# Patient Record
Sex: Male | Born: 1935 | Race: White | Hispanic: No | Smoking: Former smoker
Health system: Southern US, Community
[De-identification: ages and names within clinical notes are randomized; demographics above are authoritative.]

## PROBLEM LIST (undated history)

## (undated) DIAGNOSIS — I2510 Atherosclerotic heart disease of native coronary artery without angina pectoris: Secondary | ICD-10-CM

## (undated) DIAGNOSIS — F329 Major depressive disorder, single episode, unspecified: Secondary | ICD-10-CM

## (undated) DIAGNOSIS — I10 Essential (primary) hypertension: Secondary | ICD-10-CM

## (undated) DIAGNOSIS — M069 Rheumatoid arthritis, unspecified: Secondary | ICD-10-CM

## (undated) DIAGNOSIS — E785 Hyperlipidemia, unspecified: Secondary | ICD-10-CM

## (undated) DIAGNOSIS — F32A Depression, unspecified: Secondary | ICD-10-CM

## (undated) DIAGNOSIS — E039 Hypothyroidism, unspecified: Secondary | ICD-10-CM

## (undated) DIAGNOSIS — Z8719 Personal history of other diseases of the digestive system: Secondary | ICD-10-CM

## (undated) DIAGNOSIS — K219 Gastro-esophageal reflux disease without esophagitis: Secondary | ICD-10-CM

## (undated) DIAGNOSIS — I4891 Unspecified atrial fibrillation: Secondary | ICD-10-CM

## (undated) HISTORY — PX: TOE AMPUTATION: SHX809

## (undated) HISTORY — PX: HERNIA REPAIR: SHX51

## (undated) HISTORY — PX: JOINT REPLACEMENT: SHX530

## (undated) HISTORY — PX: OTHER SURGICAL HISTORY: SHX169

## (undated) HISTORY — PX: CHOLECYSTECTOMY: SHX55

---

## 2003-12-18 ENCOUNTER — Other Ambulatory Visit: Payer: Self-pay

## 2006-07-14 ENCOUNTER — Other Ambulatory Visit: Payer: Self-pay

## 2006-07-21 ENCOUNTER — Inpatient Hospital Stay: Payer: Self-pay | Admitting: Specialist

## 2007-12-20 ENCOUNTER — Encounter: Payer: Self-pay | Admitting: Specialist

## 2007-12-21 ENCOUNTER — Encounter: Payer: Self-pay | Admitting: Specialist

## 2008-01-21 ENCOUNTER — Encounter: Payer: Self-pay | Admitting: Specialist

## 2008-02-21 ENCOUNTER — Encounter: Payer: Self-pay | Admitting: Specialist

## 2008-03-22 ENCOUNTER — Encounter: Payer: Self-pay | Admitting: Specialist

## 2008-04-22 ENCOUNTER — Encounter: Payer: Self-pay | Admitting: Specialist

## 2011-01-15 ENCOUNTER — Inpatient Hospital Stay: Payer: Self-pay | Admitting: Internal Medicine

## 2011-01-17 ENCOUNTER — Inpatient Hospital Stay: Payer: Self-pay | Admitting: Psychiatry

## 2012-01-29 ENCOUNTER — Ambulatory Visit: Payer: Self-pay | Admitting: Internal Medicine

## 2012-02-11 ENCOUNTER — Emergency Department: Payer: Self-pay | Admitting: Emergency Medicine

## 2012-02-11 LAB — CK TOTAL AND CKMB (NOT AT ARMC)
CK, Total: 63 U/L (ref 35–232)
CK-MB: <0.5 ng/mL — ABNORMAL LOW (ref 0.5–3.6)

## 2012-02-11 LAB — COMPREHENSIVE METABOLIC PANEL
Anion Gap: 4 — ABNORMAL LOW (ref 7–16)
Calcium, Total: 8.7 mg/dL (ref 8.5–10.1)
Chloride: 104 mmol/L (ref 98–107)
Co2: 31 mmol/L (ref 21–32)
EGFR (African American): 47 — ABNORMAL LOW
Potassium: 4.1 mmol/L (ref 3.5–5.1)
SGOT(AST): 30 U/L (ref 15–37)
SGPT (ALT): 15 U/L (ref 12–78)
Total Protein: 7.7 g/dL (ref 6.4–8.2)

## 2012-02-11 LAB — TSH: Thyroid Stimulating Horm: 0.405 u[IU]/mL — ABNORMAL LOW

## 2012-02-11 LAB — URINALYSIS, COMPLETE
Bilirubin,UR: NEGATIVE
Leukocyte Esterase: NEGATIVE
Ph: 6 (ref 4.5–8.0)
RBC,UR: 1 /HPF (ref 0–5)
Squamous Epithelial: NONE SEEN
WBC UR: 3 /HPF (ref 0–5)

## 2012-02-11 LAB — CBC
HGB: 11.1 g/dL — ABNORMAL LOW (ref 13.0–18.0)
MCHC: 31.5 g/dL — ABNORMAL LOW (ref 32.0–36.0)
MCV: 84 fL (ref 80–100)
RBC: 4.2 10*6/uL — ABNORMAL LOW (ref 4.40–5.90)
WBC: 5.8 10*3/uL (ref 3.8–10.6)

## 2012-07-06 ENCOUNTER — Ambulatory Visit: Payer: Self-pay | Admitting: Physician Assistant

## 2012-09-17 ENCOUNTER — Ambulatory Visit: Payer: Self-pay | Admitting: Internal Medicine

## 2012-10-21 LAB — COMPREHENSIVE METABOLIC PANEL
Albumin: 3.8 g/dL (ref 3.4–5.0)
Anion Gap: 6 — ABNORMAL LOW (ref 7–16)
Bilirubin,Total: 0.7 mg/dL (ref 0.2–1.0)
Calcium, Total: 9.1 mg/dL (ref 8.5–10.1)
Creatinine: 1.56 mg/dL — ABNORMAL HIGH (ref 0.60–1.30)
EGFR (African American): 49 — ABNORMAL LOW
EGFR (Non-African Amer.): 42 — ABNORMAL LOW
Osmolality: 278 (ref 275–301)
SGOT(AST): 32 U/L (ref 15–37)
Total Protein: 7.8 g/dL (ref 6.4–8.2)

## 2012-10-21 LAB — CBC WITH DIFFERENTIAL/PLATELET
Basophil %: 0.2 %
Eosinophil #: 0.1 10*3/uL (ref 0.0–0.7)
HCT: 42.9 % (ref 40.0–52.0)
HGB: 15 g/dL (ref 13.0–18.0)
Lymphocyte %: 9.1 %
MCHC: 34.8 g/dL (ref 32.0–36.0)
MCV: 92 fL (ref 80–100)
Monocyte #: 0.6 x10 3/mm (ref 0.2–1.0)
Monocyte %: 6.1 %
Neutrophil #: 8.6 10*3/uL — ABNORMAL HIGH (ref 1.4–6.5)
Neutrophil %: 84 %
RBC: 4.68 10*6/uL (ref 4.40–5.90)

## 2012-10-22 ENCOUNTER — Observation Stay: Payer: Self-pay | Admitting: Internal Medicine

## 2012-10-22 LAB — URINALYSIS, COMPLETE
Bilirubin,UR: NEGATIVE
Hyaline Cast: 1
Ketone: NEGATIVE
Leukocyte Esterase: NEGATIVE
Ph: 5 (ref 4.5–8.0)
RBC,UR: 2 /HPF (ref 0–5)
Specific Gravity: 1.01 (ref 1.003–1.030)
Squamous Epithelial: <1
WBC UR: 3 /HPF (ref 0–5)

## 2012-10-22 LAB — CK-MB: CK-MB: <0.5 ng/mL — ABNORMAL LOW (ref 0.5–3.6)

## 2012-10-22 LAB — TROPONIN I: Troponin-I: <0.02 ng/mL

## 2012-10-22 LAB — CK TOTAL AND CKMB (NOT AT ARMC)
CK, Total: 81 U/L (ref 35–232)
CK, Total: 77 U/L (ref 35–232)
CK-MB: 0.9 ng/mL (ref 0.5–3.6)

## 2012-10-23 LAB — BASIC METABOLIC PANEL
BUN: 14 mg/dL (ref 7–18)
Chloride: 104 mmol/L (ref 98–107)
Co2: 33 mmol/L — ABNORMAL HIGH (ref 21–32)
Creatinine: 1.2 mg/dL (ref 0.60–1.30)
EGFR (African American): >60
Osmolality: 281 (ref 275–301)
Potassium: 3.4 mmol/L — ABNORMAL LOW (ref 3.5–5.1)
Sodium: 141 mmol/L (ref 136–145)

## 2013-08-02 ENCOUNTER — Ambulatory Visit: Payer: Self-pay | Admitting: Gastroenterology

## 2013-11-06 ENCOUNTER — Inpatient Hospital Stay: Payer: Self-pay | Admitting: Internal Medicine

## 2013-11-06 LAB — CBC WITH DIFFERENTIAL/PLATELET
BASOS PCT: 0.3 %
Basophil #: 0 10*3/uL (ref 0.0–0.1)
Eosinophil #: 0 10*3/uL (ref 0.0–0.7)
Eosinophil %: 0.7 %
HCT: 42.3 % (ref 40.0–52.0)
HGB: 13.7 g/dL (ref 13.0–18.0)
Lymphocyte #: 0.7 10*3/uL — ABNORMAL LOW (ref 1.0–3.6)
Lymphocyte %: 11.2 %
MCH: 30.6 pg (ref 26.0–34.0)
MCHC: 32.3 g/dL (ref 32.0–36.0)
MCV: 95 fL (ref 80–100)
Monocyte #: 0.5 x10 3/mm (ref 0.2–1.0)
Monocyte %: 9 %
NEUTROS ABS: 4.6 10*3/uL (ref 1.4–6.5)
Neutrophil %: 78.8 %
Platelet: 115 10*3/uL — ABNORMAL LOW (ref 150–440)
RBC: 4.46 10*6/uL (ref 4.40–5.90)
RDW: 15.3 % — AB (ref 11.5–14.5)
WBC: 5.8 10*3/uL (ref 3.8–10.6)

## 2013-11-06 LAB — URINALYSIS, COMPLETE
BILIRUBIN, UR: NEGATIVE
Bacteria: NONE SEEN
Glucose,UR: NEGATIVE mg/dL (ref 0–75)
KETONE: NEGATIVE
Leukocyte Esterase: NEGATIVE
NITRITE: NEGATIVE
Ph: 6 (ref 4.5–8.0)
Protein: NEGATIVE
RBC,UR: 1 /HPF (ref 0–5)
SQUAMOUS EPITHELIAL: NONE SEEN
Specific Gravity: 1.011 (ref 1.003–1.030)
WBC UR: 1 /HPF (ref 0–5)

## 2013-11-06 LAB — COMPREHENSIVE METABOLIC PANEL
ALT: 22 U/L (ref 12–78)
Albumin: 3.2 g/dL — ABNORMAL LOW (ref 3.4–5.0)
Alkaline Phosphatase: 109 U/L
Anion Gap: 6 — ABNORMAL LOW (ref 7–16)
BUN: 12 mg/dL (ref 7–18)
Bilirubin,Total: 0.7 mg/dL (ref 0.2–1.0)
CALCIUM: 8.5 mg/dL (ref 8.5–10.1)
CREATININE: 1.37 mg/dL — AB (ref 0.60–1.30)
Chloride: 102 mmol/L (ref 98–107)
Co2: 31 mmol/L (ref 21–32)
EGFR (African American): 57 — ABNORMAL LOW
EGFR (Non-African Amer.): 49 — ABNORMAL LOW
Glucose: 124 mg/dL — ABNORMAL HIGH (ref 65–99)
Osmolality: 279 (ref 275–301)
Potassium: 3.2 mmol/L — ABNORMAL LOW (ref 3.5–5.1)
SGOT(AST): 43 U/L — ABNORMAL HIGH (ref 15–37)
Sodium: 139 mmol/L (ref 136–145)
TOTAL PROTEIN: 7.2 g/dL (ref 6.4–8.2)

## 2013-11-06 LAB — LIPASE, BLOOD: LIPASE: 859 U/L — AB (ref 73–393)

## 2013-11-06 LAB — CK TOTAL AND CKMB (NOT AT ARMC)
CK, Total: 46 U/L
CK-MB: <0.5 ng/mL — ABNORMAL LOW (ref 0.5–3.6)

## 2013-11-06 LAB — TSH: Thyroid Stimulating Horm: 2.41 u[IU]/mL

## 2013-11-06 LAB — TROPONIN I: Troponin-I: <0.02 ng/mL

## 2013-11-07 LAB — CBC WITH DIFFERENTIAL/PLATELET
BASOS PCT: 0.4 %
Basophil #: 0 10*3/uL (ref 0.0–0.1)
EOS PCT: 1.6 %
Eosinophil #: 0.1 10*3/uL (ref 0.0–0.7)
HCT: 38.1 % — ABNORMAL LOW (ref 40.0–52.0)
HGB: 12.5 g/dL — ABNORMAL LOW (ref 13.0–18.0)
LYMPHS ABS: 0.9 10*3/uL — AB (ref 1.0–3.6)
Lymphocyte %: 23.6 %
MCH: 30.7 pg (ref 26.0–34.0)
MCHC: 32.7 g/dL (ref 32.0–36.0)
MCV: 94 fL (ref 80–100)
MONO ABS: 0.5 x10 3/mm (ref 0.2–1.0)
Monocyte %: 12.6 %
NEUTROS PCT: 61.8 %
Neutrophil #: 2.3 10*3/uL (ref 1.4–6.5)
Platelet: 104 10*3/uL — ABNORMAL LOW (ref 150–440)
RBC: 4.06 10*6/uL — ABNORMAL LOW (ref 4.40–5.90)
RDW: 15.1 % — ABNORMAL HIGH (ref 11.5–14.5)
WBC: 3.7 10*3/uL — ABNORMAL LOW (ref 3.8–10.6)

## 2013-11-07 LAB — BASIC METABOLIC PANEL
Anion Gap: 3 — ABNORMAL LOW (ref 7–16)
BUN: 10 mg/dL (ref 7–18)
CHLORIDE: 104 mmol/L (ref 98–107)
CREATININE: 1.21 mg/dL (ref 0.60–1.30)
Calcium, Total: 8.4 mg/dL — ABNORMAL LOW (ref 8.5–10.1)
Co2: 33 mmol/L — ABNORMAL HIGH (ref 21–32)
EGFR (African American): >60
EGFR (Non-African Amer.): 57 — ABNORMAL LOW
Glucose: 86 mg/dL (ref 65–99)
OSMOLALITY: 278 (ref 275–301)
Potassium: 3.2 mmol/L — ABNORMAL LOW (ref 3.5–5.1)
SODIUM: 140 mmol/L (ref 136–145)

## 2013-11-07 LAB — TSH: Thyroid Stimulating Horm: 2.8 u[IU]/mL

## 2013-11-09 LAB — COMPREHENSIVE METABOLIC PANEL
ALT: 19 U/L (ref 12–78)
Albumin: 3.2 g/dL — ABNORMAL LOW (ref 3.4–5.0)
Alkaline Phosphatase: 87 U/L
Anion Gap: 5 — ABNORMAL LOW (ref 7–16)
BUN: 5 mg/dL — AB (ref 7–18)
Bilirubin,Total: 0.6 mg/dL (ref 0.2–1.0)
CHLORIDE: 106 mmol/L (ref 98–107)
Calcium, Total: 8.7 mg/dL (ref 8.5–10.1)
Co2: 29 mmol/L (ref 21–32)
Creatinine: 1.3 mg/dL (ref 0.60–1.30)
EGFR (African American): >60
EGFR (Non-African Amer.): 52 — ABNORMAL LOW
Glucose: 84 mg/dL (ref 65–99)
Osmolality: 276 (ref 275–301)
Potassium: 3.2 mmol/L — ABNORMAL LOW (ref 3.5–5.1)
SGOT(AST): 35 U/L (ref 15–37)
Sodium: 140 mmol/L (ref 136–145)
Total Protein: 6.8 g/dL (ref 6.4–8.2)

## 2014-01-01 ENCOUNTER — Ambulatory Visit: Payer: Self-pay | Admitting: Surgery

## 2014-01-09 ENCOUNTER — Ambulatory Visit: Payer: Self-pay | Admitting: Surgery

## 2014-01-11 LAB — PATHOLOGY REPORT

## 2014-01-15 ENCOUNTER — Inpatient Hospital Stay: Payer: Self-pay | Admitting: Internal Medicine

## 2014-01-15 LAB — COMPREHENSIVE METABOLIC PANEL
ALK PHOS: 91 U/L
Albumin: 3.1 g/dL — ABNORMAL LOW (ref 3.4–5.0)
Anion Gap: 7 (ref 7–16)
BUN: 14 mg/dL (ref 7–18)
Bilirubin,Total: 0.8 mg/dL (ref 0.2–1.0)
CHLORIDE: 98 mmol/L (ref 98–107)
Calcium, Total: 8.8 mg/dL (ref 8.5–10.1)
Co2: 35 mmol/L — ABNORMAL HIGH (ref 21–32)
Creatinine: 1.35 mg/dL — ABNORMAL HIGH (ref 0.60–1.30)
GFR CALC AF AMER: 58 — AB
GFR CALC NON AF AMER: 50 — AB
GLUCOSE: 121 mg/dL — AB (ref 65–99)
OSMOLALITY: 281 (ref 275–301)
POTASSIUM: 2.9 mmol/L — AB (ref 3.5–5.1)
SGOT(AST): 31 U/L (ref 15–37)
SGPT (ALT): 17 U/L
Sodium: 140 mmol/L (ref 136–145)
Total Protein: 7.9 g/dL (ref 6.4–8.2)

## 2014-01-15 LAB — PRO B NATRIURETIC PEPTIDE: B-TYPE NATIURETIC PEPTID: 2267 pg/mL — AB (ref 0–450)

## 2014-01-15 LAB — LIPASE, BLOOD: LIPASE: 164 U/L (ref 73–393)

## 2014-01-15 LAB — CBC WITH DIFFERENTIAL/PLATELET
Basophil #: 0 10*3/uL (ref 0.0–0.1)
Basophil %: 0.5 %
EOS ABS: 0.1 10*3/uL (ref 0.0–0.7)
Eosinophil %: 1.7 %
HCT: 46.7 % (ref 40.0–52.0)
HGB: 15.1 g/dL (ref 13.0–18.0)
LYMPHS ABS: 1.1 10*3/uL (ref 1.0–3.6)
Lymphocyte %: 13.3 %
MCH: 30.4 pg (ref 26.0–34.0)
MCHC: 32.4 g/dL (ref 32.0–36.0)
MCV: 94 fL (ref 80–100)
Monocyte #: 0.6 x10 3/mm (ref 0.2–1.0)
Monocyte %: 7.3 %
Neutrophil #: 6.4 10*3/uL (ref 1.4–6.5)
Neutrophil %: 77.2 %
Platelet: 220 10*3/uL (ref 150–440)
RBC: 4.98 10*6/uL (ref 4.40–5.90)
RDW: 14.7 % — ABNORMAL HIGH (ref 11.5–14.5)
WBC: 8.3 10*3/uL (ref 3.8–10.6)

## 2014-01-15 LAB — TROPONIN I: TROPONIN-I: 0.04 ng/mL

## 2014-01-16 LAB — CBC WITH DIFFERENTIAL/PLATELET
Basophil #: 0.1 10*3/uL (ref 0.0–0.1)
Basophil %: 1 %
EOS ABS: 0.3 10*3/uL (ref 0.0–0.7)
EOS PCT: 5.4 %
HCT: 39.1 % — AB (ref 40.0–52.0)
HGB: 12.6 g/dL — AB (ref 13.0–18.0)
LYMPHS ABS: 1.4 10*3/uL (ref 1.0–3.6)
Lymphocyte %: 24.2 %
MCH: 30.4 pg (ref 26.0–34.0)
MCHC: 32.3 g/dL (ref 32.0–36.0)
MCV: 94 fL (ref 80–100)
Monocyte #: 0.6 x10 3/mm (ref 0.2–1.0)
Monocyte %: 9.6 %
NEUTROS ABS: 3.5 10*3/uL (ref 1.4–6.5)
Neutrophil %: 59.8 %
PLATELETS: 163 10*3/uL (ref 150–440)
RBC: 4.15 10*6/uL — ABNORMAL LOW (ref 4.40–5.90)
RDW: 14.7 % — AB (ref 11.5–14.5)
WBC: 5.8 10*3/uL (ref 3.8–10.6)

## 2014-01-16 LAB — URINALYSIS, COMPLETE
BLOOD: NEGATIVE
Bacteria: NONE SEEN
Bilirubin,UR: NEGATIVE
Glucose,UR: NEGATIVE mg/dL (ref 0–75)
LEUKOCYTE ESTERASE: NEGATIVE
Nitrite: NEGATIVE
Ph: 7 (ref 4.5–8.0)
Protein: 30
RBC,UR: 10 /HPF (ref 0–5)
Specific Gravity: >1.06 (ref 1.003–1.030)
Squamous Epithelial: <1
WBC UR: 3 /HPF (ref 0–5)

## 2014-01-16 LAB — COMPREHENSIVE METABOLIC PANEL
AST: 18 U/L (ref 15–37)
Albumin: 2.6 g/dL — ABNORMAL LOW (ref 3.4–5.0)
Alkaline Phosphatase: 66 U/L
Anion Gap: 9 (ref 7–16)
BUN: 12 mg/dL (ref 7–18)
Bilirubin,Total: 0.7 mg/dL (ref 0.2–1.0)
Calcium, Total: 8 mg/dL — ABNORMAL LOW (ref 8.5–10.1)
Chloride: 103 mmol/L (ref 98–107)
Co2: 35 mmol/L — ABNORMAL HIGH (ref 21–32)
Creatinine: 1.09 mg/dL (ref 0.60–1.30)
EGFR (Non-African Amer.): >60
GLUCOSE: 111 mg/dL — AB (ref 65–99)
OSMOLALITY: 293 (ref 275–301)
Potassium: 2.8 mmol/L — ABNORMAL LOW (ref 3.5–5.1)
SGPT (ALT): 15 U/L
Sodium: 147 mmol/L — ABNORMAL HIGH (ref 136–145)
TOTAL PROTEIN: 6 g/dL — AB (ref 6.4–8.2)

## 2014-01-16 LAB — MAGNESIUM
MAGNESIUM: 1.3 mg/dL — AB
Magnesium: 2.6 mg/dL — ABNORMAL HIGH

## 2014-01-16 LAB — TROPONIN I: Troponin-I: 0.04 ng/mL

## 2014-01-16 LAB — POTASSIUM: Potassium: 2.8 mmol/L — ABNORMAL LOW (ref 3.5–5.1)

## 2014-01-16 LAB — LIPASE, BLOOD: Lipase: 181 U/L (ref 73–393)

## 2014-01-17 LAB — CBC WITH DIFFERENTIAL/PLATELET
BASOS ABS: 0 10*3/uL (ref 0.0–0.1)
Basophil %: 0.5 %
EOS ABS: 0.2 10*3/uL (ref 0.0–0.7)
Eosinophil %: 2.6 %
HCT: 41.9 % (ref 40.0–52.0)
HGB: 13.8 g/dL (ref 13.0–18.0)
LYMPHS PCT: 15.7 %
Lymphocyte #: 1.1 10*3/uL (ref 1.0–3.6)
MCH: 31 pg (ref 26.0–34.0)
MCHC: 32.9 g/dL (ref 32.0–36.0)
MCV: 94 fL (ref 80–100)
MONO ABS: 0.6 x10 3/mm (ref 0.2–1.0)
Monocyte %: 8.9 %
Neutrophil #: 5 10*3/uL (ref 1.4–6.5)
Neutrophil %: 72.3 %
Platelet: 176 10*3/uL (ref 150–440)
RBC: 4.45 10*6/uL (ref 4.40–5.90)
RDW: 14.6 % — AB (ref 11.5–14.5)
WBC: 6.9 10*3/uL (ref 3.8–10.6)

## 2014-01-17 LAB — POTASSIUM: Potassium: 3.4 mmol/L — ABNORMAL LOW (ref 3.5–5.1)

## 2014-01-21 ENCOUNTER — Emergency Department: Payer: Self-pay | Admitting: Emergency Medicine

## 2014-01-21 LAB — CBC WITH DIFFERENTIAL/PLATELET
BASOS PCT: 0.7 %
Basophil #: 0.1 10*3/uL (ref 0.0–0.1)
EOS ABS: 0.2 10*3/uL (ref 0.0–0.7)
Eosinophil %: 2.1 %
HCT: 45.4 % (ref 40.0–52.0)
HGB: 15.1 g/dL (ref 13.0–18.0)
LYMPHS PCT: 15.2 %
Lymphocyte #: 1.2 10*3/uL (ref 1.0–3.6)
MCH: 30.9 pg (ref 26.0–34.0)
MCHC: 33.2 g/dL (ref 32.0–36.0)
MCV: 93 fL (ref 80–100)
Monocyte #: 0.6 x10 3/mm (ref 0.2–1.0)
Monocyte %: 7.8 %
Neutrophil #: 5.8 10*3/uL (ref 1.4–6.5)
Neutrophil %: 74.2 %
Platelet: 248 10*3/uL (ref 150–440)
RBC: 4.88 10*6/uL (ref 4.40–5.90)
RDW: 14.6 % — ABNORMAL HIGH (ref 11.5–14.5)
WBC: 7.8 10*3/uL (ref 3.8–10.6)

## 2014-01-21 LAB — COMPREHENSIVE METABOLIC PANEL
ANION GAP: 11 (ref 7–16)
Albumin: 3.1 g/dL — ABNORMAL LOW (ref 3.4–5.0)
Alkaline Phosphatase: 78 U/L
BUN: 16 mg/dL (ref 7–18)
Bilirubin,Total: 0.8 mg/dL (ref 0.2–1.0)
CALCIUM: 8.9 mg/dL (ref 8.5–10.1)
CREATININE: 1.22 mg/dL (ref 0.60–1.30)
Chloride: 101 mmol/L (ref 98–107)
Co2: 33 mmol/L — ABNORMAL HIGH (ref 21–32)
EGFR (African American): >60
EGFR (Non-African Amer.): 56 — ABNORMAL LOW
Glucose: 95 mg/dL (ref 65–99)
OSMOLALITY: 290 (ref 275–301)
POTASSIUM: 3 mmol/L — AB (ref 3.5–5.1)
SGOT(AST): 28 U/L (ref 15–37)
SGPT (ALT): 16 U/L
Sodium: 145 mmol/L (ref 136–145)
TOTAL PROTEIN: 7.5 g/dL (ref 6.4–8.2)

## 2014-01-21 LAB — MAGNESIUM: Magnesium: 1.7 mg/dL — ABNORMAL LOW

## 2014-01-21 LAB — TROPONIN I: TROPONIN-I: 0.05 ng/mL

## 2014-01-21 LAB — LIPASE, BLOOD: Lipase: 277 U/L (ref 73–393)

## 2014-02-06 ENCOUNTER — Emergency Department: Payer: Self-pay | Admitting: Emergency Medicine

## 2014-02-06 LAB — CBC WITH DIFFERENTIAL/PLATELET
BASOS ABS: 0.1 10*3/uL (ref 0.0–0.1)
Basophil %: 0.7 %
EOS ABS: 0.2 10*3/uL (ref 0.0–0.7)
Eosinophil %: 1.7 %
HCT: 35 % — AB (ref 40.0–52.0)
HGB: 11.8 g/dL — ABNORMAL LOW (ref 13.0–18.0)
Lymphocyte #: 0.7 10*3/uL — ABNORMAL LOW (ref 1.0–3.6)
Lymphocyte %: 7 %
MCH: 31.6 pg (ref 26.0–34.0)
MCHC: 33.7 g/dL (ref 32.0–36.0)
MCV: 94 fL (ref 80–100)
MONOS PCT: 7.4 %
Monocyte #: 0.7 x10 3/mm (ref 0.2–1.0)
NEUTROS ABS: 7.8 10*3/uL — AB (ref 1.4–6.5)
Neutrophil %: 83.2 %
Platelet: 283 10*3/uL (ref 150–440)
RBC: 3.74 10*6/uL — AB (ref 4.40–5.90)
RDW: 15.1 % — AB (ref 11.5–14.5)
WBC: 9.4 10*3/uL (ref 3.8–10.6)

## 2014-02-06 LAB — BASIC METABOLIC PANEL
ANION GAP: 9 (ref 7–16)
BUN: 11 mg/dL (ref 7–18)
CHLORIDE: 102 mmol/L (ref 98–107)
CO2: 26 mmol/L (ref 21–32)
Calcium, Total: 8 mg/dL — ABNORMAL LOW (ref 8.5–10.1)
Creatinine: 1.24 mg/dL (ref 0.60–1.30)
EGFR (African American): >60
GFR CALC NON AF AMER: 55 — AB
Glucose: 90 mg/dL (ref 65–99)
Osmolality: 273 (ref 275–301)
Potassium: 4.1 mmol/L (ref 3.5–5.1)
Sodium: 137 mmol/L (ref 136–145)

## 2014-04-25 ENCOUNTER — Ambulatory Visit: Payer: Self-pay | Admitting: Gastroenterology

## 2014-09-09 ENCOUNTER — Ambulatory Visit: Payer: Self-pay | Admitting: Family Medicine

## 2014-09-12 ENCOUNTER — Encounter
Admit: 2014-09-12 | Disposition: A | Discharge status: Home / Self Care | Payer: Self-pay | Attending: Surgery | Admitting: Surgery

## 2014-09-14 ENCOUNTER — Ambulatory Visit: Payer: Self-pay | Admitting: Physician Assistant

## 2014-09-21 ENCOUNTER — Encounter
Admit: 2014-09-21 | Disposition: A | Discharge status: Home / Self Care | Payer: Self-pay | Attending: Surgery | Admitting: Surgery

## 2014-10-12 NOTE — Discharge Summary (Signed)
PATIENT NAME:  Matthew Foley, Matthew Foley MR#:  [id number] DATE OF BIRTH:  [birth year]  DATE OF ADMISSION:  10/22/2012 DATE OF DISCHARGE:  10/23/2012  PRESENTING COMPLAINTS: Vomiting and chest discomfort.   DISCHARGE DIAGNOSES: 1.  Intractable nausea and vomiting, secondary to large hiatal hernia along with gastroesophageal reflux disease/gastritis, resolved, improved.  2.  Chest pain, secondary to gastritis.  3.  Depression.  4.  Hypertension.   CONDITION ON DISCHARGE: Stable.   MEDICATIONS:  1.  Metoprolol, extended-release, 100 mg daily.  2.  Levothyroxine 50 mg daily.  3.  Citalopram 40 mg p.o. daily.  4.  Alprazolam 0.25 tablet b.i.d.  5.  Ensure 3 times a day with meals.  6.  Lansoprazole 30 mg, 1 capsule twice a day.   DIET: Mechanical soft diet.   Follow up with Dr. Arlana Pouch in 1 to 2 weeks.   Follow up with Dr. Bluford Kaufmann if needed.   Cardiac enzymes x 3 negative.   Basic metabolic panel within normal limits, with a potassium of 3.4.   UA negative for urinary tract infection.   Chest x-ray: Large sliding hiatal hernia with mild atelectasis.   CBC within normal limits.   GI consultation with Dr. Bluford Kaufmann.   BRIEF SUMMARY OF HOSPITAL COURSE: The patient is a pleasant 81 year old Caucasian gentleman with a history of hypertension and CAD, along with a history of GERD, comes to the Emergency Room with chest pain/chest discomfort, followed by nausea, vomiting at home. He was admitted with:   1.  Intractable nausea and vomiting, which was suspected likely due to a large sliding hiatal hernia. The patient has had EGD and colonoscopy done in November 2013 by Dr. Niel Hummer. Per patient, he was told he has a large hiatal hernia. Was started on PPI once daily, however the patient continued to have intermittent episodes of nausea and vomiting. He had 2 episodes of large emesis after he ate a sandwich on the day of admission. He was started on IV fluids, continued his PPI twice a day. Seen by Dr. Bluford Kaufmann, and  since the patient has had extensive an workup for the same no further workup was recommended at this time. The patient was also recommended to get surgery done, however he does not want to do it at this time.  2.  Chest pain: Seemed more of GI cause, possibly from GERD in the setting of a large hiatal hernia. A modified barium swallow was done in January 2014. This was essentially negative. The patient's cardiac enzymes x 3 were negative. No chest pain, and EKG changes were noted. He has had 2 stress tests in the past which were essentially negative, per patient. 3.  Hypertension: Well-controlled on Toprol-XL.  4.  Hypothyroidism: Continue Synthroid.  5. DVT prophylaxis: With Lovenox.   Hospital stay otherwise remained stable.   CODE STATUS: THE PATIENT REMAINED A FULL CODE.  Time spent: Forty minutes.   ____________________________ Wylie Hail Allena Katz, MD sap:dm D: 10/24/2012 06:59:38 ET T: 10/24/2012 07:48:24 ET JOB#: [id number]  cc: Ari Engelbrecht A. Allena Katz, MD, <Dictator> Jillene Bucks. Arlana Pouch, MD Ezzard Standing. Bluford Kaufmann, MD Willow Ora MD ELECTRONICALLY SIGNED 11/08/2012 15:20

## 2014-10-12 NOTE — Consult Note (Signed)
Chief Complaint:  Subjective/Chief Complaint Feeling much better. Tolerating liquids. NO more nausea/vomiting/chest pain.   VITAL SIGNS/ANCILLARY NOTES: **Vital Signs.:   04-May-14 08:09  Vital Signs Type Routine  Temperature Source oral  Pulse Pulse 48  Systolic BP Systolic BP 765  Diastolic BP (mmHg) Diastolic BP (mmHg) 77  Mean BP 92  Pulse Ox % Pulse Ox % 93  Oxygen Delivery Room Air/ 21 %   Brief Assessment:  Cardiac Regular   Respiratory clear BS   Gastrointestinal Normal   Lab Results: Routine Chem:  04-May-14 05:03   Glucose, Serum 85  BUN 14  Creatinine (comp) 1.20  Sodium, Serum 141  Potassium, Serum  3.4  Chloride, Serum 104  CO2, Serum  33  Calcium (Total), Serum 9.0  Anion Gap  4  Osmolality (calc) 281  eGFR (African American) >60  eGFR (Non-African American)  58 (eGFR values <55m/min/1.73 m2 may be an indication of chronic kidney disease (CKD). Calculated eGFR is useful in patients with stable renal function. The eGFR calculation will not be reliable in acutely ill patients when serum creatinine is changing rapidly. It is not useful in  patients on dialysis. The eGFR calculation may not be applicable to patients at the low and high extremes of body sizes, pregnant women, and vegetarians.)   Assessment/Plan:  Assessment/Plan:  Assessment Nausea/vomiting/chest pain. Resolved.   Plan Try solids. If stable, ok for discharge later today on PPI BID. If sxs recur, can f/u with [facility] for outpt EGD. If large hiatal hernia is the only objective finding, then will need surgical referral for hiatal hernia repair. Will sign off. Thanks.   Electronic Signatures: OVerdie Shire(MD)  (Signed [id number])  Authored: Chief Complaint, VITAL SIGNS/ANCILLARY NOTES, Brief Assessment, Lab Results, Assessment/Plan   Last Updated: 04-May-14 10:39 by OVerdie Shire(MD)

## 2014-10-12 NOTE — Consult Note (Signed)
Pt seen and examined. Full consult to follow. Pt with recurrent nausea/vomiting and chest pain. Had both EGD/colon by Dr. Niel Hummer in Nov. Found to have large hiatal hernia. Was taking PPI at home with relief of heartburn sxs. R/O for MI. Better today. Not interested in having hernia repair at this time. However, if sxs persist and no other causes found, then may need to pursue surgical evaluations. Start clear liquid diet today on PPI. If sxs do not improve, then may need to repeat EGD on Monday to see if patient has PUD or esophagitis contributing to his problems. No need for barium swallow now. Will follow. Thanks.  Electronic Signatures: Lutricia Feil (MD)  (Signed on 03-May-14 10:15)  Authored  Last Updated: 03-May-14 10:15 by Lutricia Feil (MD)

## 2014-10-12 NOTE — Consult Note (Signed)
PATIENT NAME:  Matthew Foley, Matthew Foley MR#:  [id number] DATE OF BIRTH:  [birth year]  DATE OF CONSULTATION:  10/22/2012  CONSULTING PHYSICIAN:  Ezzard Standing. Sami Roes, MD  REASON FOR REFERRAL: Chest pain, and nausea and vomiting.   DESCRIPTION: The patient is a 81 year old white male who presented with chest pain that developed after eating dinner. He had an episode of nausea and vomiting followed by mid-sternal chest pain that radiated to his neck. He felt a little short of breath. Therefore, he was brought in by EMS for further evaluation. Even after being in the Emergency Room he had several more vomiting episodes. Therefore, the patient was admitted for further evaluation.   According to the patient, the patient has intermittent nausea and vomiting for the past several months. At times food seems to get stuck in his throat. He has also lost some weight over the past year or so.   The patient has been seen by Dr. Niel Hummer in the past, who did an upper endoscopy and colonoscopy back in November. Upper endoscopy showed a large hiatal hernia. Colonoscopy was apparently negative. I do not have those records to confirm. The patient also had a modified barium swallow in January of this year which was supposedly negative. The patient was  recommended to see a surgeon for a hernia repair but the patient refused. Since being admitted he is feeling better and there is currently no nausea for right now. He is ruling out for a MI.   Other past medical history includes: History of coronary artery disease and hypothyroidism, history of hypertension, hyperlipidemia. He also has rheumatoid arthritis.   SURGICAL HISTORY: Includes bilateral knee replacements, hernia repairs. He also had a toe amputation.   He has no known drug allergies.   MEDICATIONS AT HOME: Include alprazolam twice a day, citalopram once a day, levothyroxine once a day, Toprol and Prevacid, daily.   SOCIAL HISTORY: He smokes some cigars a few times a week.  Denies any alcohol use. He lives alone.   FAMILY HISTORY: Notable for heart disease and cancer.   REVIEW OF SYSTEMS: There is some generalized weakness and some weight loss, but no changes in vision or hearing. There is no coughing or shortness of breath. He does have some chest pain, but no palpitations.   GI symptoms include some nausea, vomiting, and occasional diarrhea.   The rest of the review of symptoms is negative.   PHYSICAL EXAMINATION: GENERAL: The patient is in no acute distress. He is well-nourished and well-built. He is afebrile.  VITAL SIGNS: Are stable.  HEENT: Normocephalic, atraumatic head. Pupils are equally reactive. Throat was clear.  NECK: Supple.  CARDIAC: Shows a regular rhythm and rate, without murmurs.  LUNGS: Clear bilaterally.  ABDOMEN: Showed normoactive bowel sounds, soft. There may be a mild tenderness in the epigastric region. There is no hepatomegaly.  EXTREMITIES: No clubbing, cyanosis, or edema.  SKIN: Negative.  NEUROLOGICAL: Examination is nonfocal.   LABS: Were essentially negative. Creatinine was slightly up at 1.56. Troponin levels are normal. White count is 10.3. Hemoglobin was 15 on admission.   EKG showed some bifascicular block.   Chest x-ray was negative.   This is a patient with chest pain, followed by nausea or vomiting. He has had bouts of nausea and vomiting in the past. The chest pain appears more gastrointestinal in nature. He may have some  reflux or esophageal spasm, but it could also be related to his large hiatal hernia. At this point  there is  no need to repeat the barium swallow. If the symptoms continue I may consider repeating an upper endoscopy to assess for the large hiatal hernia, but rule out other causes such as esophagitis or gastritis.   I think that it would be important that he goes on and stays on a proton pump inhibitor twice a day. If the symptoms continue with other obvious causes then he should consider having a  surgical evaluation to have his hiatal hernia repaired. I will start the patient on a clear liquid diet today and see how he does.   Thank you for the referral.     ____________________________ Ezzard Standing. Bluford Kaufmann, MD pyo:dm D: 10/23/2012 08:29:58 ET T: 10/23/2012 08:55:43 ET JOB#: [id number]  cc: Ezzard Standing. Bluford Kaufmann, MD, <Dictator> Ezzard Standing Deacon Gadbois MD ELECTRONICALLY SIGNED 10/24/2012 13:19

## 2014-10-12 NOTE — H&P (Signed)
PATIENT NAME:  Matthew Foley, Matthew Foley MR#:  [id number] DATE OF BIRTH:  Oct 05, 1932  PRIMARY CARE PHYSICIAN: Jillene Bucks. Arlana Pouch, MD  REFERRING PHYSICIAN: Dorothea Glassman, MD   CHIEF COMPLAINT: Chest pain.   HISTORY OF PRESENT ILLNESS: The patient is a 81 year old pleasant white male who presented to the Emergency Department with complaints of chest pain. The patient ate his dinner. About an hour later, around 7:30 to 8:00 p.m., he had 1 episode of a large amount of vomiting, followed by starting to experience chest pain in the midsternal area, pressure-like pain radiating to the neck. This was associated with severe shortness of breath. Considering this, called his daughter, who called EMS and was brought to the Emergency Department. The patient had persistence of the pain until he came to the Emergency Department. The patient was given Zofran in the Emergency Department. The patient went to chest x-ray and came back, had another episode of large vomiting. The patient was placed on nitroglycerin patch before going to the x-ray. Had relief of the pain after had another episode of vomiting. The patient currently denies having any pain; however, experiences some burning sensation in the throat. The patient has been having these episodes of nausea and vomiting for the last 1 month, which have been getting more frequent. The patient states he gets these episodes of nausea and vomiting every 2 to 3 days. Per daughter, this is projectile vomiting, large amount. The patient experiences sometimes food stuck in the chest. He usually feels it 2 to 3 days well after these episodes of nausea and vomiting and starts to develop nausea and gets these multiple episodes of vomiting. The patient has lost about 35 pounds in the last 1 year. Concerning this, went to his primary care physician, who referred the patient to gastroenterology. The patient underwent EGD and colonoscopy and was found to have hiatal hernia. The patient also had swallow  evaluation, which was unremarkable. He does not remember if the patient had any barium swallow. The patient also states that has been experiencing more frequent bowel movements, 2 to 3 times a day. However, colonoscopy was done which was unremarkable. The patient has been more depressed since the patient's wife expired about a year back.   PAST MEDICAL HISTORY:  1. Hypertension.  2. Coronary artery disease, per records, had history of stent placement.  3. Hyperlipidemia.  4. Hypothyroidism.  5. Rheumatoid arthritis with ulnar deviation.  6. Bilateral knee replacement.  7. Two hernia repairs.  8. 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   ALLERGIES: No known drug allergies.   HOME MEDICATIONS:  1. Prevacid 1 capsule daily.  2. Toprol-XL 100 mg daily.  3. Levothyroxine 50 mcg daily.  4. Citalopram 1 tablet once a day.  5. Alprazolam 0.25 mg 1 tablet 2 times a day.   SOCIAL HISTORY: Smokes cigars a few times a week. Denies drinking alcohol or using illicit drugs. Currently lives by himself.   FAMILY HISTORY: Father had heart disease and cancer. Father died of lung cancer.   REVIEW OF SYSTEMS:  CONSTITUTIONAL: Generalized weakness, weight loss.  EYES: No change in vision. No discharge or redness.  ENT: Somewhat decreased hearing. No sore throat.  RESPIRATORY: No cough, shortness of breath.  CARDIOVASCULAR: Chest pain.  GASTROINTESTINAL: Nausea, vomiting, diarrhea.   GENITOURINARY: No dysuria or hematuria.  SKIN: No rash or lesions.  HEMATOLOGIC: No easy bruising or bleeding.  ENDOCRINE: No polyuria or polydipsia.  PSYCHIATRIC: Depression. NEUROLOGIC: No weakness or numbness.  MUSCULOSKELETAL:  Has multiple joint pains and aches.   PHYSICAL EXAMINATION:  GENERAL: This is a well-built, well-nourished, age-appropriate male, lying down in the bed, not in distress.  VITAL SIGNS: Temperature 97.9, pulse 67, blood pressure 137/82, respiratory rate of 16, oxygen saturation is 96% on room  air.  HEENT: Head normocephalic, atraumatic. Eyes: No scleral icterus. Conjunctivae normal. Pupils equal and reactive to light. Extraocular movements intact. Mucous membranes moist. No pharyngeal erythema.  NECK: Supple. No lymphadenopathy. No JVD. No carotid bruit.  CHEST: No focal tenderness.  LUNGS: Bilaterally clear to auscultation.  HEART: S1, S2 regular. No murmurs are heard.  ABDOMEN: Bowel sounds present. Soft. Mild tenderness in the epigastric area. No guarding or rebound tenderness. No hepatosplenomegaly.  MUSCULOSKELETAL: Has ulnar deviation. Good range of motion in all the extremities.  SKIN: No rash or lesions.  LYMPHATIC: No cervical, axillary or inguinal lymphadenopathy.  NEUROLOGIC: The patient is alert, oriented to place, person and time. Cranial nerves II through XII intact. No motor and sensory deficits.   LABORATORIES: CMP: BUN 23, creatinine of 1.56. The rest of all the values are within normal limits. Troponin less than 0.2. CBC: WBC of 10.3, hemoglobin 15, platelet count of 175.   ELECTROCARDIOGRAM, 12-LEAD: There is a new bifascicular block.   CHEST X-RAY: One view, portable: No acute cardiopulmonary disease.   ASSESSMENT AND PLAN: The patient is a 81 year old male who comes to the Emergency Department with chest pain followed by nausea and vomiting.   1. Chest pain. This seems to be more of a gastrointestinal cause, possibly from the esophageal spasm. However, considering the patient's risk factors of previous history of coronary artery disease and chest pain, will admit the patient to the telemetry bed. Cycle cardiac enzymes x3. Obtain stress test in the morning. Will keep the patient n.p.o. after midnight except for medications. Will hold the beta blocker in the morning prior to the stress test. Will also obtain lipid profile. Continue with aspirin.  2. Nausea, vomiting, very highly concerning about obstruction, either esophageal or gastric outlet. Per the patient, had  EGD and colonoscopy as well as swallow evaluation. Will obtain barium swallow to rule out any gastric outlet obstruction. Continue with the PPI.  3. Hypertension, currently well controlled.  4. Hypothyroidism. Will check the TSH. Continue Synthroid.  5. Keep the patient on deep vein thrombosis prophylaxis with Lovenox.   TIME SPENT: 50 minutes.    ____________________________ Susa Griffins, MD pv:OSi D: 10/22/2012 02:55:00 ET T: 10/22/2012 81:27:51 ET JOB#: [id number]  cc: Susa Griffins, MD, <Dictator> Jillene Bucks. Arlana Pouch, MD Clerance Lav Roald Lukacs MD ELECTRONICALLY SIGNED 10/24/2012 7:03

## 2014-10-13 NOTE — Discharge Summary (Signed)
PATIENT NAME:  Matthew Foley, Matthew Foley MR#:  [id number] DATE OF BIRTH:  Sep 10, 1932  DATE OF ADMISSION:  11/06/2013 DATE OF DISCHARGE:  11/09/2013  ADMISSION DIAGNOSES:   1.  Nausea and vomiting.  2.  Common bile duct dilation.   DISCHARGE DIAGNOSES: 1.  Nausea and vomiting with acute pancreatitis, slightly elevated lipase on admission.  2.  Common bile duct dilation.  3.  Pancreatitis.  4.  Depression.  5.  Hepatitis. 6.  History of coronary artery disease.   CONSULTATIONS:  Dr. Candace Cruise.   PROCEDURES: The patient underwent ERCP on 11/08/2013.  The entire main duct was  severely dilated, uncertain significance. The examination was suspicious for sludge. He received a stent placement.   LABORATORIES AT DISCHARGE: Sodium 140, potassium 3.2, chloride 106, bicarb 29, BUN 5, creatinine 1.30, glucose 84, calcium 8.7, bilirubin 0.6, alk phos 87, ALT 19, AST 35, total protein 6.8, albumin 3.2.   HOSPITAL COURSE:  This is a 81 year old male who presented with nausea and vomiting and found to have a dilated CBD on CT scan. For further details, please refer to Dr. Ward Givens  H and P.  1.  Nausea and vomiting secondary to acute pancreatitis with elevation in his lipase, also possibility due to the common bile duct dilation and the fact that patient was retaining his food. He is status post ERCP with stent placement. He is tolerating his diet quite well. He had periampullary diverticula that were also found on ERCP.  2.  Common bile duct dilation. He underwent ERCP as mentioned above. He also had an M.R.C.P. M.R.C.P. will be followed up by Dr. Candace Cruise.  I discussed discharge plan with Dr. Candace Cruise, and, as per him, the patient was stable to go home. Titrated to a regular diet and will have followup with Dr. Rayann Heman for M.R.C.P. results.  3.  Pancreatitis. The patient presented with elevated lipase. He was placed on n.p.o. initially, IV fluids. His symptoms actually resolved and he was tolerating his diet.  4.  Depression. The  patient to continue Paxil.  5.  Hypothyroidism, on Synthroid.  6. History of coronary artery disease. The patient is not taking any medications at home. He was discharged with aspirin daily.    DISCHARGE MEDICATIONS: 1.  Lansoprazole 30 mg b.i.d.  2.  Paxil 20 mg daily.  3.  Synthroid 75 mcg daily.  4.  Ropinirole 0.5 mg at bedtime.  5.  Xanax 0.5 mg b.i.d. p.r.n.  6.  Ensure 1 can t.i.d.  7.  Aspirin 81 mg daily.   DISCHARGE DIET: Low sodium.   DISCHARGE ACTIVITY: As tolerated, but no heavy lifting.   DISCHARGE FOLLOWUP:  The patient will follow up next week with Dr. Rayann Heman.    TIME SPENT: 35 minutes.   The patient is medically stable for discharge.   ____________________________ Jyron Turman P. Benjie Karvonen, MD spm:dmm D: 11/09/2013 11:48:40 ET T: 11/09/2013 12:11:18 ET JOB#: [id number]  cc: Duval Macleod P. Benjie Karvonen, MD, <Dictator> Arther Dames, MD Leona Carry. Hall Busing, MD Donell Beers Silvestre Mines MD ELECTRONICALLY SIGNED 11/09/2013 16:42

## 2014-10-13 NOTE — Consult Note (Signed)
PATIENT NAME:  Matthew Foley, Matthew Foley MR#:  [id number] DATE OF BIRTH:  29-Apr-1933  DATE OF CONSULTATION:  01/16/2014  REFERRING PHYSICIAN:  Dr. Renae Gloss.  CONSULTING PHYSICIAN:  Joselyn Arrow, NP and Dr. Midge Minium.  PRIMARY GASTROENTEROLOGIST: Dr. Dow Adolph.  SURGEON:  Dr. Renda Rolls.  PRIMARY CARE PHYSICIAN:  Dewaine Oats.  REASON FOR CONSULTATION:  Intractable nausea and vomiting.   HISTORY OF PRESENT ILLNESS: Matthew Foley is a 81 year old Caucasian male who has history of chronic nausea and vomiting. He was a patient of Dr. Dow Adolph. Over the last 2-1/2 years, he has had chronic problems with nausea and vomiting. In February of this year, he had a normal gastric emptying study. He has had a complicated course over the past 2 months  with dilated common bile duct, choledocholithiasis on M.R.C.P. and an ERCP attempted here at [facility] by Dr. Bluford Kaufmann.  He was later sent to at [facility] where Dr. Wyline Mood did an ERCP and sphincterotomy and removed common bile duct stone. He then did get better. He underwent laparoscopic cholecystectomy by Dr. Renda Rolls 6 days ago. Intraoperative cholangiogram demonstrated dilated bile duct, but no retained common duct stones. For the first 3 days, he felt better; however, 3 days ago, he began to have recurrent nausea and vomiting. He also complains of chest pain, which radiates from his epigastric area and radiates between his shoulder blades. He rates the pain 10 out of 10 on pain scale. He was on ranitidine 300 mg daily at home and had previously been on Protonix, but he had not resumed PPI in quite some time. He denies any dysphagia or odynophagia. He did have a normal upper GI tract on ERCP by Dr. Bluford Kaufmann. He reports a 70+ pound weight loss over the past year. He did have a negative troponin and an  EKG which showed a bundle branch bundle and PVCs. He has vomited every time he eats something for the last 3 days; is mostly undigested food.  Prior to 3 days ago, he was able to eat and had normal bowel movements. He did have a normal bowel movement yesterday as well. He has not had any rectal bleeding, melena or mucus in his stools. He was taking Norco for pain. He had a mildly elevated lipase at 859 back in May 2015, and CT of the chest, abdomen and pelvis at that time showed a dilated common bile duct and pancreatic ductal dilatation and 9 mm filling defect in the distal common bile duct near the ampulla. There was pancreatic head prominence concerning for right either inflammation versus neoplasm. He had a small sliding hiatal hernia.   Yesterday, he had a CT scan of the abdomen and pelvis with contrast which showed a moderate pneumoperitoneum, but no free extravasation of contrast, questionable small perforated viscus; however, the contrast did pass through the bowel. He also had mild peripancreatic inflammation and stranding and a 9 mm common bile duct.   PAST MEDICAL AND SURGICAL HISTORY: Hypertension, thyroid disease, dyslipidemia, coronary artery disease, rheumatoid arthritis, GERD, hiatal hernia, choledocholithiasis status post laparoscopic cholecystectomy, bilateral knee replacement, right second toe amputation, hiatal hernia repair.   MEDICATIONS PRIOR TO ADMISSION: Alprazolam 0.5 mg b.i.d. p.r.n., Centrum multivitamins daily, K-Tab 20 mEq daily for the last 5 days, levothyroxine 75 mcg daily, Mag-Ox 400 mg daily, Paxil 20 mg daily, ropinirole 0.5 mg at bedtime.   ALLERGIES: No known drug allergies.   FAMILY HISTORY: There is no known family history of  colorectal carcinoma, liver or chronic GI problems.   SOCIAL HISTORY: He smokes cigars. Denies any other tobacco intake. Denies any alcohol or illicit drug use. He has 3 daughters. 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   REVIEW OF SYSTEMS:  See HPI, otherwise negative 10 point review of systems.     PHYSICAL EXAMINATION: VITAL SIGNS: Temp 97.8, pulse 85, respirations  18, blood pressure 187/113, O2 sat 95% on room air.  GENERAL: He is a thin, Caucasian male who is alert, pleasant and cooperative, in no acute distress, is accompanied by his daughter.  HEENT: Sclerae clear. Noninjected conjunctivae, pink. Oropharynx pink and moist without any lesions.  NECK: Supple without mass or thyromegaly.  CHEST: Heart regular rate and rhythm. Normal S1, S2 without murmurs, clicks, rubs or gallops.  LUNGS: Clear to auscultation bilaterally.  ABDOMEN: Positive bowel sounds x 4. No bruits auscultated. Abdomen is soft, nontender and nondistended without palpable mass or hepatosplenomegaly. No rebound tenderness or guarding. Well-healing lap cholecystectomy scars, right upper quadrant.  EXTREMITIES: Without clubbing or edema.  SKIN: Pink, warm and dry without any rash or jaundice.  NEUROLOGIC: Grossly intact.  MUSCULOSKELETAL:  Good equal movement and strength bilaterally.  PSYCHIATRIC: Normal mood and affect.   LABORATORY STUDIES: Glucose 111, sodium 147, potassium 2.8, CO2 of 35, calcium 8, otherwise normal basic metabolic panel. Magnesium 2.6. Lipase 164. LFTs normal except for albumin of 2.6 and total protein of 6. Troponin is 0.04. Hemoglobin has dropped from 15.1 to 12.6, hematocrit 39.1. White blood cell count is normal. Platelet count is normal. Urinalysis positive for 30 mg/dL of protein and mucus.   IMPRESSION: [name] is a very pleasant 81 year old Caucasian male who is 6 days status post cholecystectomy by Dr. Renda Rolls, admitted with 3 days of nausea and vomiting every time he eats, chest and epigastric pain. CT suggests possible mild pneumoperitoneum, but maybe postsurgical changes only versus perforated viscus and possible mild pancreatitis, although lipase was normal. His chest and epigastric pain radiates to his back and is worse postprandially. He has chronic gastroesophageal reflux disease, nausea and vomiting, although this seems different to patient.  Since he is 6 days postop, we will have surgery evaluate for possible postop complications. Perforated viscus would be unlikely given passage of contrast on CT. We will recheck lipase to look for pancreatitis and check troponin to rule out cardiac origin. I have discussed his care with Dr. Midge Minium and our plan of care is below.   PLAN: 1.  Stat lipase and troponin.  2.  Zofran p.r.n. and R.N. is to contact Dr. Renae Gloss regarding pain medication.  3.  Clear liquids.  4.  Protonix 40 mg b.i.d. IV.  5.  Await surgical recommendations.  6.  CBC in the morning.   Thank you for allowing [place] to participate in his care. Dr. Servando Snare will re-evaluate the patient tomorrow.    ____________________________ Joselyn Arrow, NP klj:dmm D: 01/16/2014 21:21:55 ET T: 01/16/2014 21:57:39 ET JOB#: [id number]  cc: Joselyn Arrow, NP, <Dictator> Jillene Bucks Arlana Pouch, MD Joselyn Arrow FNP ELECTRONICALLY SIGNED 01/18/2014 14:40

## 2014-10-13 NOTE — Consult Note (Signed)
Details:   - GI Note:  Double duct sign on CT concerning for pancreatic mass.   Plan for ERCP tomorrow.   NPO after midnight.  Will not get MRCP, unlikely to change management.   Electronic Signatures: Dow Adolph (MD)  (Signed 19-May-15 13:17)  Authored: Details   Last Updated: 19-May-15 13:17 by Dow Adolph (MD)

## 2014-10-13 NOTE — Consult Note (Signed)
ERCP done for Dr. Shelle Iron. Significantly dilated CBD with filling defect in distal CBD. Periampullary diverticulum. No obvious stricture. Sludge came out. Yet, poor bile drainage. Thus, cytology brushings and then stent placed with good bile drainage. Clear liquid diet. Recheck LFT in AM. thanks.  Electronic Signatures: Lutricia Feil (MD)  (Signed on 20-May-15 13:50)  Authored  Last Updated: 20-May-15 13:50 by Lutricia Feil (MD)

## 2014-10-13 NOTE — Consult Note (Signed)
Brief Consult Note: Diagnosis: n/v, abnormal CT scan, dilated CBD, prominant pancreatic head.   Patient was seen by consultant.   Consult note dictated.   Discussed with Attending MD.   Comments: Patient was seen and examined. N/V have subsided today and he was advanced to a CL diet which he is tolerating well. No current abdominal pain/back pain. no n/v. Overall feeling well today. Labs and imaging concerning for possible pancreatic neoplasm. Will need further eval of CBD/filling defect and PD. Discussed with patient the need for ERCP. Risks and benefits were outlined and he verbalized understanding. Will likely keep NPO after MN tonight and plan on procedure tomorrow. Will communicate with Dr Bluford Kaufmann regarding scheduling. All questions were answered.  Full consult being dictated.  Electronic Signatures: Brantley Stage M (PA-C)  (Signed 19-May-15 13:17)  Authored: Brief Consult Note   Last Updated: 19-May-15 13:17 by Ashok Cordia (PA-C)

## 2014-10-13 NOTE — Op Note (Signed)
PATIENT NAME:  Matthew Foley, Matthew Foley MR#:  [id number] DATE OF BIRTH:  06/28/1932  DATE OF PROCEDURE:  01/09/2014  PREOPERATIVE DIAGNOSIS: Chronic cholecystitis.   POSTOPERATIVE DIAGNOSIS: Chronic cholecystitis.   PROCEDURE: Laparoscopic cholecystectomy, cholangiogram.   SURGEON: Renda Rolls, MD  ANESTHESIA: General.   INDICATIONS: This 81 year old male has a history of vomiting and weight loss and epigastric pain, CT findings of common duct stone. He had ERCP and sphincterotomy and was advised to have laparoscopic cholecystectomy to avoid future gallstones.   DESCRIPTION OF PROCEDURE: The patient was placed on the operating table in the supine position under general anesthesia. The abdomen was prepared with ChloraPrep and draped in a sterile manner. A short incision was made in the inferior aspect of the umbilicus and carried down to the deep fascia which was grasped with laryngeal hook and elevated. A Veress needle was inserted, aspirated and irrigated with a saline solution. Next, the peritoneal cavity was inflated with carbon dioxide. The Veress needle was removed. A 10 mm cannula was inserted. The 10 mm, 0 degree laparoscope was inserted to view the peritoneal cavity. The liver appeared slightly infiltrated with fat but not cirrhotic. Another incision was made in the epigastrium slightly to the right of the midline to introduce an 11 mm cannula. Two incisions were made in the lateral aspect of the right upper quadrant to introduce two 5 mm cannulas.   With the patient in the reverse Trendelenburg position and turned several degrees to the left, the gallbladder was retracted towards the right shoulder. The infundibulum was retracted inferiorly and laterally. The location of porta hepatis was demonstrated. The gallbladder neck was mobilized with incision of the visceral peritoneum. The cystic duct was dissected free from surrounding structures. There was a cystic artery which was dissected free from  surrounding structures. A critical view of safety was demonstrated. An Endo Clip was placed across the cystic duct adjacent to the neck of the gallbladder. An incision was made in the cystic duct. There was some arterial bleeding at this point, which appeared to be a branch of the cystic artery and was controlled with endoclips. Next, the cystic duct was further incised and the Reddick catheter was inserted. The cholangiogram was done with injection of half-strength Conray-60 dye. The cholangiogram demonstrated a dilated common bile duct and reflux into the liver and flow into the duodenum. No retained stones were seen. The Reddick catheter was removed. The cystic duct was ligated with Endoclip and also with 0 chromic Endoloop and then the first Endoclip was removed and replaced with a new Endoclip adjacent to the chromic ligature. Next, the gallbladder was dissected free from the liver with use of hook and cautery. Several other small bleeding points were controlled with Endoclips along the gallbladder bed. The gallbladder was completely separated. The site was irrigated with heparinized saline solution and aspirated. Hemostasis subsequently appeared to be intact. Next, the gallbladder was delivered up through the infraumbilical incision, opened and suctioned, removed and submitted in formalin for routine pathology. The right upper quadrant was further inspected as the cannulas were removed. There was some bleeding from the epigastric port site and the laparoscope was reinserted through the umbilical port and cannula. There was no bleeding seen within. The tissues surrounding the 11 mm port site were infiltrated with 0.5% Sensorcaine with epinephrine. Hemostasis subsequently appeared to be intact. The other sites were infiltrated with subcutaneous 0.5% Sensorcaine with epinephrine. Subsequently, the wounds were closed with interrupted 5-0 chromic subcuticular sutures, benzoin and Steri-Strips.  Dressings were  applied with paper tape. The patient tolerated the procedure satisfactorily and was then prepared for transfer to the recovery room.  ____________________________ Shela Commons. Renda Rolls, MD jws:sb D: 01/09/2014 16:23:09 ET T: 01/09/2014 16:40:02 ET JOB#: [id number]  cc: Adella Hare, MD, <Dictator> Adella Hare MD ELECTRONICALLY SIGNED 01/10/2014 9:10

## 2014-10-13 NOTE — H&P (Signed)
PATIENT NAME:  Matthew Foley, Matthew Foley MR#:  [id number] DATE OF BIRTH:  1932-07-17  DATE OF ADMISSION:  11/06/2013  PRIMARY CARE PHYSICIAN: Dr. Dewaine Oats.   REFERRING PHYSICIAN: Dr. Sharma Covert.   CHIEF COMPLAINT: Nausea, vomiting.   HISTORY OF PRESENT ILLNESS: The patient is a 81 year old male with a history of hypertension, coronary artery disease status post stent placement, with sliding hiatal hernia with intractable nausea and vomiting, comes to the Emergency Department with the complaints of nausea and vomiting for the last 2 days. The patient is unable to keep down any food. The patient states he was doing well until Saturday. Saturday afternoon ate some steak, followed by started feeling fullness in the stomach and associated with abdominal pain. Had the projectile vomiting. The patient continued to have multiple episodes of vomiting. Concerning this, the patient is brought to the Emergency Department.   WORKUP IN THE EMERGENCY DEPARTMENT: CT abdomen and pelvis was obtained, showed severe dilatation of the distal common bile duct with a filling defect present concerning for possible noncalcified stone pathology such as mass. Also noted to have dilatation of the pancreatic duct. The patient has elevated lipase of 850. Denies having any abdominal pain. The patient had extensive workup done with EGD, colonoscopy, and a barium swallow which came back to be negative. Concerning this, patient is found to be dehydrated with an elevated BUN and creatinine.   PAST MEDICAL HISTORY:  1. Hypertension.  2. Hyperlipidemia.  3. Coronary artery disease status post a stent placement.  4. Chronic nausea and vomiting.  5. Hypothyroidism.  6. Rheumatoid arthritis with ulnar deviation.  7. Bilateral knee replacement.  8. Two hernia repairs.  9. 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   ALLERGIES: No known drug allergies.   HOME MEDICATIONS:  1. Ropinirole 0.5 mg once a day.  2. Paxil 20 mg once a day.   3. Levothyroxine 75 mcg once a day.  4. Lansoprazole 30 mg 2 times a day.  5. Alprazolam  0.5 mg 2 times a day.   SOCIAL HISTORY: Continues to smoke cigars. Denies drinking alcohol or using illicit drugs. Lives by himself.   FAMILY HISTORY: Father had heart disease and cancer, died of lung cancer.   REVIEW OF SYSTEMS:  CONSTITUTIONAL: Severe generalized weakness. Significant weight loss of 50 pounds in the last 1 year.  EYES: No change in vision.  EARS, NOSE, AND THROAT:  No change in hearing.  RESPIRATORY: No cough, shortness of breath.  CARDIOVASCULAR: No chest pain.  GASTROINTESTINAL: Has nausea, vomiting,  GENITOURINARY: No dysuria or hematuria.  SKIN: No rashes or lesions.  HEMATOLOGIC: No easy bruising or bleeding.  PSYCHIATRIC: History of depression.  NEUROLOGIC: No weakness or numbness in any part of the body.  MUSCULOSKELETAL: Has multiple joint pains and aches.   PHYSICAL EXAMINATION:  GENERAL: This is a well built, well nourished, age-appropriate male lying down in the bed, not in distress.  VITAL SIGNS: Temperature 98.5, pulse 94, blood pressure 153/92, respiratory rate of 18, oxygen saturations 94% on room air.  HEENT: Head normocephalic, atraumatic. There is no scleral icterus. Conjunctivae normal. Pupils equal and react to light. Extraocular movements are intact. Mucous membranes moist. No pharyngeal erythema.  NECK: Supple. No lymphadenopathy. No JVD. No carotid bruit. No thyromegaly.  CHEST: Has no focal tenderness.  LUNGS: Bilaterally clear to auscultation.  HEART: S1, S2 regular. No murmurs are heard.  ABDOMEN: Bowel sounds present. Soft, nontender, nondistended. Could not appreciate any hepatosplenomegaly.  EXTREMITIES: No pedal  edema. Pulses 2+.  SKIN: No rash or lesions.  MUSCULOSKELETAL: Good range of motion in all the extremities. NEUROLOGICAL:  The patient is alert, oriented to place, person, and time. Cranial nerves II through XII intact. Motor 5/5 in  upper and lower extremities.   LABORATORY DATA: CBC and CMP are completely within normal limits, except for BUN 12, creatinine of 1.37. TSH 2.41, lipase 859. UA negative for nitrites and leukocyte esterase.   DIAGNOSTIC DATA: CT abdomen and pelvis as mentioned above shows significant dilatation of the common bile duct and the pancreatic duct. Recommended MRCP.   CT head without contrast: No acute intracranial abnormality, severe vessel ischemic change.   ASSESSMENT AND PLAN: This patient is a 81 year old male who comes to the Emergency Department with intractable nausea and vomiting.  1. Nausea and vomiting.    from the sliding hiatal hernia. However, considering the patient's significant dilatation of the common bile duct even though the patient has normal LFTs, we will obtain MRCP. Consult GI. Keep the patient on clear liquids. Continue with antinausea medications. Continue with IV Protonix.  2. Renal insufficiency secondary to dehydration. Continue with IV fluids and followup.  3. Sliding hiatal hernia. If other workup comes back to be negative for the cause of the nausea and the vomiting, patient will benefit being seen by general surgery during the hospital stay.  4. Weight loss secondary to decreased p.o. intake. CT head is negative.  5. Keep the patient on DVT prophylaxis with Lovenox.   TIME SPENT: 50 minutes.    ____________________________ Susa Griffins, MD pv:dd/am D: 11/07/2013 01:02:59 ET T: 11/07/2013 01:51:10 ET JOB#: [id number]  cc: Susa Griffins, MD, <Dictator> Jillene Bucks. Arlana Pouch, MD  Clerance Lav Jami Bogdanski MD ELECTRONICALLY SIGNED 11/17/2013 0:44

## 2014-10-13 NOTE — Consult Note (Signed)
PATIENT NAME:  Matthew Foley, Matthew Foley MR#:  [id number] DATE OF BIRTH:  October 12, 1932  GASTROINTESTINAL CONSULTATION   DATE OF CONSULTATION:  11/07/2013  REFERRING PHYSICIAN:  Susa Griffins, MD CONSULTING PHYSICIAN:  Hardie Shackleton. Heath Tesler, PA-C  REASON FOR CONSULTATION: Dilated common bile duct.   ATTENDING GASTROENTEROLOGIST: Dow Adolph, MD   HISTORY OF PRESENT ILLNESS: This is a pleasant 81 year old gentleman accompanied by one of his daughters, who initially presented to the hospital with concerns of intractable nausea and vomiting. He has established with Dr. Shelle Iron as an outpatient and has had intermittent episodes of nausea and vomiting for quite some time now. Since being admitted, his nausea and vomiting has actually subsided, and he was started on a clear liquid diet earlier this morning, which he is tolerating well. There is no associated abdominal pain. No fever or chills. No chest pain or shortness of breath. No changes to his bowel habits, though he did get some diarrhea following the contrast from his CAT scan. He underwent a CT of the chest, abdomen and pelvis, revealing a normal gallbladder, but common bile duct was dilated distally at 16 mm, with pancreatic ductal dilation as well. There was a 9 mm filling defect in the distal common bile duct near the ampulla. There is some pancreatic head prominence, concerning for either inflammation versus neoplasm. He also has a large sliding hiatal hernia, which he has previously been made aware of. Overall, labs are unrevealing. Liver enzymes are normal, and white blood cells are normal. However, his lipase is mildly elevated at 859. He has had a prior EGD and colonoscopy in 2013 and underwent a gastric emptying study recently that was unremarkable. The patient has lost about 75 pounds over the past 8 months, and the patient feels that some of this is because of a lack of appetite and some of it is because most of what he eats ends up coming back up. He has  been drinking Ensure to supplement his caloric intake.  PAST MEDICAL HISTORY: Hypertension, dyslipidemia, coronary artery disease, hypothyroidism, rheumatoid arthritis.   PAST SURGICAL HISTORY: Bilateral knee replacement, right second toe amputation, hernia repair.   ALLERGIES: No known drug allergies.   HOME MEDICATIONS: Lansoprazole, alprazolam, levothyroxine, ropinirole, Paxil.   SOCIAL HISTORY: Habits: The patient smokes an occasional cigar, but denies any cigarettes or other tobacco intake. No alcohol or illicit drug use. He has several daughters.   FAMILY HISTORY: There is no known family history of GI malignancy, colon polyps or IBD.   REVIEW OF SYSTEMS: A 10-system review of systems was obtained on the patient. Pertinent positives are mentioned above and otherwise negative.   OBJECTIVE:  VITAL SIGNS: Blood pressure 131/65, heart rate 68, respirations 20, temperature 97.1, bedside pulse oximetry 91%.  GENERAL: This is a pleasant 81 year old gentleman accompanied by his daughter, in no acute distress. Alert and oriented x3.  HEAD: Atraumatic, normocephalic.  NECK: Supple. No lymphadenopathy noted.  HEENT: Sclerae anicteric. Mucous membranes moist.  PULMONARY: Respirations are even and unlabored. Clear to auscultation, bilateral anterior lung fields.  CARDIAC: Regular rate and rhythm. S1, S2 noted.  ABDOMEN: Soft, nontender, nondistended. Normoactive bowel sounds noted in all 4 quadrants. No guarding or rebound. No masses, hernias or organomegaly appreciated.  PSYCHIATRIC: Appropriate mood and affect.  NEUROLOGIC: Cranial nerves II through XII are grossly intact.  EXTREMITIES: Negative for lower extremity edema, 2+ pulses noted in bilateral upper extremities.   LABORATORY DATA: White blood cells 3.7, hemoglobin 12.5, hematocrit 38.1, platelets 104, MCV 94.  Lipase 859. Troponins negative. Sodium 140, potassium 3.2, BUN 10, creatinine 1.21, glucose 86, TSH 2.8, total bilirubin 0.7,  alkaline phosphatase 109, ALT 22, AST 43.   IMAGING: A CT scan of the chest, abdomen and pelvis was obtained on the patient. Gallbladder did appear normal, but his common bile duct was markedly dilated distally at 16 mm. There was also pancreatic ductal dilation. A 9 mm filling defect was noted in the distal common bile duct near the ampulla. There is also some pancreatic head prominence concerning for either inflammation versus neoplasm. He has a large sliding hiatal hernia.   ASSESSMENT:  1. Nausea and vomiting. This has been intermittent for several months and did seem to get worse yesterday. This has subsided today, and he is tolerating a clear liquid diet.  2. Unintentional weight loss of 75 pounds over the past 8 months.  3. Abnormal CT scan showing dilated common bile duct and pancreatic duct, with a filling defect in the distal common bile duct. There is also some pancreatic head prominence concerning for inflammation versus neoplasm.   PLAN: I have discussed this patient's case in detail with Dr. Dow Adolph, who was involved in the development of the patient's plan of care. At the present time, the overall clinical picture is certainly concerning for possibly a pancreatic neoplasm, and therefore, we do feel an appropriate next step would be to further classify this with an ERCP. There is no clear indication for an MRCP at the present moment as this likely would not change our medical management currently. We did discuss this procedure with the patient in detail, including risks and benefits, and he verbalized understanding and is in agreement to proceed. Differential diagnosis was outlined in detail. We will keep him n.p.o. after midnight tonight and proceed with an ERCP with Dr. Bluford Kaufmann tomorrow. All questions were answered. We will continue to monitor this patient closely and make further recommendations pending above and per clinical course.   Thank you so much for this consultation and for  allowing [place] to participate in the patient's plan of care.   ATTENDING GASTROENTEROLOGIST: Dow Adolph, MD   ____________________________ Hardie Shackleton. Felecia Stanfill, PA-C kme:lb D: 11/07/2013 13:40:55 ET T: 11/07/2013 13:50:56 ET JOB#: [id number]  cc: Hardie Shackleton. Aamiyah Derrick, PA-C, <Dictator> Hardie Shackleton Dekota Kirlin PA ELECTRONICALLY SIGNED 11/07/2013 16:09

## 2014-10-13 NOTE — Consult Note (Signed)
Brief Consult Note: Diagnosis: Post-op nausea, vomiting, chest/epigastric pain.   Patient was seen by consultant.   Comments: Matthew Foley is a very pleasant 81 y/o caucasian male who is 6 days s/p cholecystectomy by Dr Renda Rolls admitted with 3 days of nausea, vomiting every time he eats, chest & epigastric pain.  CT suggests possible mild pneumoperitoneum but may be post-surgical changes only versus perforated viscus & possible mild pancreatitis although lipase was normal.  His chest & epigastric pain readiates to his back & is worse postprandially.  He has chronic GERD, nausea & vomiting although this seems different to patient.  Since he is 6 days post-op will have surgery evaluate for possible post-op complications.  Perforated viscus would be unlikely given passage of contrast to colon on CT.  Will recheck lipase to look for pancreatitis & check troponin to r/o cardiac origin.  I have discussed her care with Dr Ebony Hail [facility] & our plan of care is below.  Plan: 1) Stat lipase & troponin 2) continue zofran prn & RN has contacted Dr Hilton Sinclair regarding pain medication 3) Clear liquids 4) Protonix 40mg  BID IV 5) await surgical consult 6) CBC AM Thanks for allowing to participate in his care.  Dr [place] will re-evaluate pt tomorrow.  Please see full dictated note. Servando Snare.  Electronic Signatures: #[id number] (NP)  (Signed 28-Jul-15 21:24)  Authored: Brief Consult Note   Last Updated: 28-Jul-15 21:24 by 10-12-1997 (NP)

## 2014-10-13 NOTE — Discharge Summary (Signed)
PATIENT NAME:  Matthew Foley, Matthew Foley MR#:  [id number] DATE OF BIRTH:  04-28-1933  DATE OF ADMISSION:  01/15/2014 DATE OF DISCHARGE:  01/17/2014  PRIMARY CARE PHYSICIAN:  Katherina Right C. Arlana Pouch, MD.  FINAL DIAGNOSES: 1.  Hypokalemia. 2.  Hypomagnesemia.  3.  Nausea, vomiting, recent cholecystectomy.  4.  Hypothyroidism.  5.  History of coronary artery  disease.  6.  Anxiety and depression.   MEDICATIONS ON DISCHARGE: Include Paxil 20 mg daily, levothyroxine 75 mcg daily, Requip 0.5 mg at bedtime, alprazolam 0.5 mg 1 tablet twice a day as needed for anxiety and nervousness, multivitamin 1 tablet daily, magnesium oxide 400 mg daily for 5 days, K-Tabs 20 mEq daily for 5 days, omeprazole 40 mg twice a day, Ensure 240 mL 3 times a day.  Stop taking Norco.   DIET: Low fat, low cholesterol diet.  Start as full liquid diet and then proceed to soft diet with pureed meats.   ACTIVITY: As tolerated.   FOLLOWUP:  Follow up with Dr. Shelle Iron, gastroenterology, in 1-2 weeks, with Dr. Dewaine Oats.   HOSPITAL COURSE: The patient was admitted 01/15/2014, discharged 01/17/2014. Came in with nausea, vomiting. The patient had a recent ERCP with stone removal and then cholecystectomy, came in with 3 days of nausea, vomiting.  LABORATORY AND RADIOLOGICAL DATA DURING THE HOSPITAL COURSE: Included an EKG that showed right bundle branch block, left anterior fascicular block. BNP 2226. Troponin negative. Lipase 164. Glucose 121, BUN 14, creatinine 1.35, sodium 140, potassium 2.9, chloride 98, CO2 of 35, calcium 8.8. Liver function tests normal range. White blood cell count 8.3, H and H 15.1 and 46.7, platelet count of 220,000.  CT scan of the abdomen and pelvis, moderate pneumoperitoneum slightly more than would be expected, given recent surgery. Magnesium 1.3. Repeat magnesium 2.6. Repeat potassium 2.8. Repeat potassium upon discharge 3.4. Troponin negative. Repeat lipase 181. CBC within normal range upon discharge.   HOSPITAL COURSE  PER PROBLEM LIST:  1.  For the hypokalemia and hypomagnesemia, probably related to the nausea and vomiting, and poor appetite. This was replaced during the hospital course, and will have oral replacement upon going home.  2.  Nausea and vomiting with recent cholecystectomy, Dr. Renda Rolls saw in consultation. No surgical complications at this point. Abdomen was soft, nontender. No urgent surgery needed. I spoke with Dr. Servando Snare, gastroenterology.  His PA did see the patient. No indications for endoscopy at this time. He advised full liquid diet for a few days then go up to soft diet with pureed meats and able to discharge home and follow up with Dr. Alycia Rossetti as outpatient. Would prescribe PPI b.i.d. and also Ensure for more calories. 3.  Hypothyroidism on levothyroxine. 4.  History of coronary artery disease.  The patient's chest pain was believed secondary to the nausea and vomiting and acid reflux. 5.  Anxiety and depression on Paxil and Xanax.  TIME SPENT ON DISCHARGE: 35 minutes.    ____________________________ Herschell Dimes. Renae Gloss, MD rjw:ds D: 01/17/2014 15:21:49 ET T: 01/17/2014 15:46:27 ET JOB#: [id number]  cc: Herschell Dimes. Renae Gloss, MD, <Dictator> Jillene Bucks. Arlana Pouch, MD Dow Adolph, MD  Salley Scarlet MD ELECTRONICALLY SIGNED 01/18/2014 15:46

## 2014-10-13 NOTE — H&P (Signed)
PATIENT NAME:  Matthew Foley, Matthew Foley MR#:  [id number] DATE OF BIRTH:  1933-04-08  DATE OF ADMISSION:  01/15/2014  PRIMARY CARE PHYSICIAN: Dr. Dewaine Oats  REFERRING PHYSICIAN: Dr. Loreli Dollar  CHIEF COMPLAINT: Nausea, vomiting.  HISTORY OF PRESENT ILLNESS: The patient is a 81 year old male with history of chronic nausea and vomiting, who was admitted on 11/06/2013 with choledocholithiasis and acute pancreatitis. The patient underwent ERCP at [facility] and underwent elective cholecystectomy on 01/09/2014 by Dr. Renda Rolls. The patient states, after the surgery, the patient did well for about 2 to 3 days. He started to experience nausea and vomiting, multiple episodes, unable to keep down any food for the last 3 days. Denies having any diarrhea. Denies having any fever. Concerning this, the patient came to the Emergency Department. In the Emergency Department, CT of the abdomen and pelvis done, showed moderate pneumoperitoneum, slightly more than would be expected, given recent surgery. No extravasation of oral contrast. Mild peripancreatic inflammatory stranding, raising the possibility of pancreatitis. The patient's lipase is 164. Does not have any elevated white blood cell count or elevated LFTs.  PAST MEDICAL HISTORY: 1.  Hypertension. 2.  Hyperlipidemia. 3.  Coronary artery disease, status post stent placement. 4.  Chronic nausea and vomiting. 5.  Hypothyroidism. 6.  Rheumatoid arthritis with ulnar deviation. 7.  Bilateral knee replacements. 8.  Two hernia repairs. 9.  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     ALLERGIES: No known drug allergies.  HOME MEDICATIONS:  1.  Ropinirole 0.5 mg once a day. 2.  Paxil 20 mg once a day. 3.  Norco 5/325 mg 1 to 2 tablets every 4 hours as needed. 4.  Levothyroxine 75 mcg once a day. 5.  Centrum multivitamin once a day. 6.  Alprazolam 0.5 mg 2 times a day as needed.   SOCIAL HISTORY: Continues to smoke cigars. Denies drinking alcohol  or using illicit drugs. Lives by himself.  FAMILY HISTORY: Father had heart disease and cancer. Died of lung cancer.  REVIEW OF SYSTEMS:  CONSTITUTIONAL: Experiencing generalized weakness. EYES: No change in vision. ENT: No change in hearing. RESPIRATORY: No cough, shortness of breath. CARDIOVASCULAR: No chest pain, palpitations. GASTROINTESTINAL: Has nausea and vomiting. GENITOURINARY: No dysuria or hematuria. SKIN: No rash or lesions. HEMATOLOGIC: No easy bruising or bleeding. MUSCULOSKELETAL: No joint pains. Has osteoarthritis.  PHYSICAL EXAMINATION: GENERAL: This is a well-built, well-nourished, age-appropriate male, lying down in the bed, not in distress. VITAL SIGNS: Temperature 97.6, pulse 81, blood pressure 148/70, respiratory rate of 20, oxygen saturation 95% on room air. HEENT: Head normocephalic, atraumatic. There is no scleral icterus. Conjunctivae normal. Pupils equal and reactive. Extraocular movements are intact. Mucous membranes moist. No pharyngeal erythema. NECK: Supple. No lymphadenopathy. No JVD. No carotid bruit. CHEST: No focal tenderness. LUNGS: Bilateral clear to auscultation. HEART: S1 and S2. Regular. No murmurs are heard. ABDOMEN: Bowel sounds are present. Soft, nontender, nondistended. No hepatosplenomegaly. EXTREMITIES: No pedal edema. Pulses 2+. SKIN: No rash or lesions. MUSCULOSKELETAL: Has good range of motion in all extremities. NEUROLOGIC: The patient is alert, oriented to place, person, and time. Cranial nerves II through XII are intact. Motor 5/5 in upper and lower extremities.  LABORATORY DATA: CT of the abdomen and pelvis: Free air consistent with post surgery. BNP 2267. Lipase 164. CMP is completely within normal limits except for a potassium of 2.9. CBC is completely within normal limits.  ASSESSMENT AND PLAN: The patient is a 81 year old male who comes with nausea and  vomiting.  1.  Nausea and vomiting. The patient does not have any obvious  signs of any infection or inflammation. No elevated white blood cell count. Afebrile. We are concerned that this could be from the narcotics. Hold all narcotic pain medications the patient has been receiving since the surgery. Keep the patient on nothing by mouth. Continue with IV fluids. Once the patient's nausea improves, we will start the patient on a clear-liquid diet and advance the diet. 2.  Gastroesophageal reflux disease. Continue with Nexium. 3.  Hypothyroidism. Continue with Synthroid. 4.  Keep the patient on deep venous thrombosis prophylaxis with Lovenox. 5.  Tobacco use. Counseled with the patient.  TIME SPENT: 50 minutes.    ____________________________ Susa Griffins, MD pv:cg D: 01/16/2014 00:43:28 ET T: 01/16/2014 01:52:29 ET JOB#: [id number]  cc: Susa Griffins, MD, <Dictator> Susa Griffins MD ELECTRONICALLY SIGNED 01/25/2014 21:10

## 2014-10-24 ENCOUNTER — Encounter: Payer: Medicare PPO | Attending: General Surgery | Admitting: Surgery

## 2014-10-24 DIAGNOSIS — I2510 Atherosclerotic heart disease of native coronary artery without angina pectoris: Secondary | ICD-10-CM | POA: Diagnosis not present

## 2014-10-24 DIAGNOSIS — K449 Diaphragmatic hernia without obstruction or gangrene: Secondary | ICD-10-CM | POA: Insufficient documentation

## 2014-10-24 DIAGNOSIS — T8131XS Disruption of external operation (surgical) wound, not elsewhere classified, sequela: Secondary | ICD-10-CM | POA: Diagnosis present

## 2014-10-24 DIAGNOSIS — Y839 Surgical procedure, unspecified as the cause of abnormal reaction of the patient, or of later complication, without mention of misadventure at the time of the procedure: Secondary | ICD-10-CM | POA: Insufficient documentation

## 2014-10-26 NOTE — Progress Notes (Addendum)
SAVERIO, KADER ([id number]) Visit Report for 10/24/2014 Chief Complaint Document Details Patient Name: ISHAN, SANROMAN. Date of Service: 10/24/2014 1:15 PM Medical Record Number: [id number] Patient Account Number: [id number] Date of Birth/Sex: 1932-10-17 (81 y.o. Male) Treating RN: Primary Care Physician: Dewaine Oats Other Clinician: Referring Physician: Treating Physician/Extender: BURNS, Regis Bill in Treatment: 6 Information Obtained from: Patient Chief Complaint Abdominal wall ulcers, s/p g-tube removal. Electronic Signature(s) Signed: 10/24/2014 4:29:30 PM By: Madelaine Bhat MD Entered By: Madelaine Bhat on 10/24/2014 13:53:52 Felipe Drone ([id number]) -------------------------------------------------------------------------------- HPI Details Patient Name: ERION, WEIGHTMAN. Date of Service: 10/24/2014 1:15 PM Medical Record Number: [id number] Patient Account Number: [id number] Date of Birth/Sex: [birth year] (81 y.o. Male) Treating RN: Primary Care Physician: Dewaine Oats Other Clinician: Referring Physician: Treating Physician/Extender: BURNS, Regis Bill in Treatment: 6 History of Present Illness HPI Description: Pleasant 81 year old with a past medical history significant for coronary artery disease. Status post laparoscopic cholecystectomy in the summer of 2015 and a laparoscopic paraesophageal hernia repair with G-tube placement in August 2015 at [facility]. The patient says that his G-tube was removed in approximately November 2015, and he subsequently noted draining ulcerations at his previous G-tube site (left upper quadrant). Two large permanent sutures removed. Granulation tissue treated with silver nitrate. He returns to clinic for follow-up and is without complaints. No significant pain. No drainage. No fever or chills. Tolerating a soft diet. Electronic Signature(s) Signed: 10/24/2014 4:29:30 PM By: Madelaine Bhat MD Entered By: Madelaine Bhat on  10/24/2014 13:54:37 Felipe Drone ([id number]) -------------------------------------------------------------------------------- Physical Exam Details Patient Name: AREK, SPADAFORE. Date of Service: 10/24/2014 1:15 PM Medical Record Number: [id number] Patient Account Number: [id number] Date of Birth/Sex: March 19, 1933 (81 y.o. Male) Treating RN: Primary Care Physician: Dewaine Oats Other Clinician: Referring Physician: Treating Physician/Extender: BURNS, WALTER Weeks in Treatment: 6 Constitutional . Pulse regular. Respirations normal and unlabored. Afebrile. . Notes Abdominal wall ulcerations are completely re-epithelialized. No evidence for infection. No drainage. Abdomen soft and nontender. Electronic Signature(s) Signed: 10/24/2014 4:29:30 PM By: Madelaine Bhat MD Entered By: Madelaine Bhat on 10/24/2014 13:55:09 Felipe Drone ([id number]) -------------------------------------------------------------------------------- Physician Orders Details Patient Name: HAYTHAM, MAHER. Date of Service: 10/24/2014 1:15 PM Medical Record Number: [id number] Patient Account Number: [id number] Date of Birth/Sex: [birth year] (81 y.o. Male) Treating RN: Clover Mealy, RN, BSN, [employer] Primary Care Physician: Dewaine Oats Other Clinician: Referring Physician: Treating Physician/Extender: BURNS, Regis Bill in Treatment: 6 Verbal / Phone Orders: Yes Clinician: Afful, RN, BSN, Rita Read Back and Verified: Yes Diagnosis Coding Discharge From [facility] Services o Discharge from [facility] - Treatment COmpleted Electronic Signature(s) Signed: 10/24/2014 4:29:30 PM By: Madelaine Bhat MD Signed: 10/25/2014 5:21:09 PM By: Elpidio Eric BSN, RN Entered By: Elpidio Eric on 10/24/2014 13:38:06 Felipe Drone ([id number]) -------------------------------------------------------------------------------- Problem List Details Patient Name: LENARDO, WESTWOOD. Date of Service: 10/24/2014 1:15 PM Medical Record  Number: [id number] Patient Account Number: [id number] Date of Birth/Sex: 20-Jun-1933 (81 y.o. Male) Treating RN: Primary Care Physician: Dewaine Oats Other Clinician: Referring Physician: Treating Physician/Extender: BURNS, Regis Bill in Treatment: 6 Active Problems ICD-10 Encounter Code Description Active Date Diagnosis T81.31XS Disruption of external operation (surgical) wound, not 09/12/2014 Yes elsewhere classified, sequela K44.9 Diaphragmatic hernia without obstruction or gangrene 09/12/2014 Yes I25.10 Atherosclerotic heart disease of native coronary artery 09/12/2014 Yes without angina pectoris Inactive Problems Resolved Problems Electronic Signature(s) Signed: 10/24/2014 4:29:30 PM By: Madelaine Bhat MD Entered By: Madelaine Bhat on 10/24/2014 13:53:38 [place], [place] ([id number]) --------------------------------------------------------------------------------  Progress Note Details Patient Name: ARMEL, RABBANI. Date of Service: 10/24/2014 1:15 PM Medical Record Number: [id number] Patient Account Number: [id number] Date of Birth/Sex: Oct 27, 1932 (81 y.o. Male) Treating RN: Primary Care Physician: Dewaine Oats Other Clinician: Referring Physician: Treating Physician/Extender: BURNS, Regis Bill in Treatment: 6 Subjective Chief Complaint Information obtained from Patient Abdominal wall ulcers, s/p g-tube removal. History of Present Illness (HPI) Pleasant 81 year old with a past medical history significant for coronary artery disease. Status post laparoscopic cholecystectomy in the summer of 2015 and a laparoscopic paraesophageal hernia repair with G-tube placement in August 2015 at [facility]. The patient says that his G-tube was removed in approximately November 2015, and he subsequently noted draining ulcerations at his previous G-tube site (left upper quadrant). Two large permanent sutures removed. Granulation tissue treated with silver nitrate. He returns to clinic for  follow-up and is without complaints. No significant pain. No drainage. No fever or chills. Tolerating a soft diet. Objective Constitutional Pulse regular. Respirations normal and unlabored. Afebrile. Vitals Time Taken: 1:24 PM, Height: 74 in, Weight: 156.9 lbs, BMI: 20.1, Temperature: 97.9 F, Pulse: 67 bpm, Respiratory Rate: 18 breaths/min, Blood Pressure: 114/69 mmHg. General Notes: Abdominal wall ulcerations are completely re-epithelialized. No evidence for infection. No drainage. Abdomen soft and nontender. Integumentary (Hair, Skin) Wound #1 status is Open. Original cause of wound was Surgical Injury. The wound is located on the Left Abdomen - Upper Quadrant. The wound measures 0cm length x 0cm width x 0cm depth; 0cm^2 area and 0cm^3 volume. RIC, ROSENBERG ([id number]) Assessment Active Problems ICD-10 T81.31XS - Disruption of external operation (surgical) wound, not elsewhere classified, sequela K44.9 - Diaphragmatic hernia without obstruction or gangrene I25.10 - Atherosclerotic heart disease of native coronary artery without angina pectoris healed abdominal wall ulcerations (at previous G-tube site). Plan Discharge From [facility] Services: Discharge from Wound Care Center - Treatment COmpleted return to clinic when necessary. Electronic Signature(s) Signed: 10/24/2014 4:29:30 PM By: Madelaine Bhat MD Entered By: Madelaine Bhat on 10/24/2014 13:55:38

## 2014-10-26 NOTE — Progress Notes (Signed)
Foley, Matthew ([id number]) Visit Report for 10/24/2014 Arrival Information Details Patient Name: Matthew, Foley. Date of Service: 10/24/2014 1:15 PM Medical Record Number: [id number] Patient Account Number: [id number] Date of Birth/Sex: 04/05/1933 (81 y.o. Male) Treating RN: Huel Coventry Primary Care Physician: Dewaine Oats Other Clinician: Referring Physician: Treating Physician/Extender: BURNS, Regis Bill in Treatment: 6 Visit Information History Since Last Visit Added or deleted any medications: No Patient Arrived: Matthew Foley Any new allergies or adverse reactions: No Arrival Time: 13:23 Had a fall or experienced change in No Accompanied By: self activities of daily living that may affect Transfer Assistance: Manual risk of falls: Patient Identification Verified: Yes Signs or symptoms of abuse/neglect since last No Secondary Verification Process Yes visito Completed: Has Dressing in Place as Prescribed: Yes Patient Has Alerts: No Pain Present Now: No Electronic Signature(s) Signed: 10/24/2014 5:34:22 PM By: Elliot Gurney, RN, BSN, Kim RN, BSN Entered By: Elliot Gurney, RN, BSN, Kim on 10/24/2014 13:24:01 Matthew Foley ([id number]) -------------------------------------------------------------------------------- Clinic Level of Care Assessment Details Patient Name: Matthew, Foley. Date of Service: 10/24/2014 1:15 PM Medical Record Number: [id number] Patient Account Number: [id number] Date of Birth/Sex: Aug 29, 1932 (81 y.o. Male) Treating RN: Clover Mealy, RN, BSN, [place] S[place] Primary Care Physician: Dewaine Oats Other Clinician: Referring Physician: Treating Physician/Extender: BURNS, Regis Bill in Treatment: 6 Clinic Level of Care Assessment Items TOOL 4 Quantity Score []  - Use when only an EandM is performed on FOLLOW-UP visit 0 ASSESSMENTS - Nursing Assessment / Reassessment []  - Reassessment of Co-morbidities (includes updates in patient status) 0 []  - Reassessment of Adherence to Treatment Plan  0 ASSESSMENTS - Wound and Skin Assessment / Reassessment []  - Simple Wound Assessment / Reassessment - one wound 0 []  - Complex Wound Assessment / Reassessment - multiple wounds 0 []  - Dermatologic / Skin Assessment (not related to wound area) 0 ASSESSMENTS - Focused Assessment []  - Circumferential Edema Measurements - multi extremities 0 []  - Nutritional Assessment / Counseling / Intervention 0 []  - Lower Extremity Assessment (monofilament, tuning fork, pulses) 0 []  - Peripheral Arterial Disease Assessment (using hand held doppler) 0 ASSESSMENTS - Ostomy and/or Continence Assessment and Care []  - Incontinence Assessment and Management 0 []  - Ostomy Care Assessment and Management (repouching, etc.) 0 PROCESS - Coordination of Care X - Simple Patient / Family Education for ongoing care 1 15 []  - Complex (extensive) Patient / Family Education for ongoing care 0 []  - Staff obtains , Records, Test Results / Process Orders 0 []  - Staff telephones HHA, Nursing Homes / Clarify orders / etc 0 []  - Routine Transfer to another Facility (non-emergent condition) 0 DEJOHN, IBARRA ( ) []  - Routine Hospital Admission (non-emergent condition) 0 []  - New Admissions / / Ordering NPWT, Apligraf, etc. 0 []  - Emergency Hospital Admission (emergent condition) 0 X - Simple Discharge Coordination 1 10 []  - Complex (extensive) Discharge Coordination 0 PROCESS - Special Needs []  - Pediatric / Minor Patient Management 0 []  - Isolation Patient Management 0 []  - Hearing / Language / Visual special needs 0 []  - Assessment of Community assistance (transportation, D/C planning, etc.) 0 []  - Additional assistance / Altered mentation 0 []  - Support Surface(s) Assessment (bed, cushion, seat, etc.) 0 INTERVENTIONS - Wound Cleansing / Measurement []  - Simple Wound Cleansing - one wound 0 []  - Complex Wound Cleansing - multiple wounds 0 []  - Wound Imaging (photographs - any  number of wounds) 0 []  - Wound Tracing (instead of photographs) 0 []  - Simple Wound Measurement -  one wound 0 []  - Complex Wound Measurement - multiple wounds 0 INTERVENTIONS - Wound Dressings []  - Small Wound Dressing one or multiple wounds 0 []  - Medium Wound Dressing one or multiple wounds 0 []  - Large Wound Dressing one or multiple wounds 0 []  - Application of Medications - topical 0 []  - Application of Medications - injection 0 INTERVENTIONS - Miscellaneous []  - External ear exam 0 LORENZ, DONLEY. ( ) []  - Specimen Collection (cultures, biopsies, blood, body fluids, etc.) 0 []  - Specimen(s) / Culture(s) sent or taken to Lab for analysis 0 []  - Patient Transfer (multiple staff / Lift / Similar devices) 0 []  - Simple Staple / Suture removal (25 or less) 0 []  - Complex Staple / Suture removal (26 or more) 0 []  - Hypo / Hyperglycemic Management (close monitor of Blood Glucose) 0 []  - Ankle / Brachial Index (ABI) - do not check if billed separately 0 X - Vital Signs 1 5 Has the patient been seen at the hospital within the last three years: Yes Total Score: 30 Level Of Care: New/Established - Level 1 Electronic Signature(s) Signed: 10/25/2014 5:21:09 PM By: BSN, RN Entered By: on 10/25/2014 15:19:42 Matthew Foley ([id number]) -------------------------------------------------------------------------------- Encounter Discharge Information Details Patient Name: Matthew, SKIBICKI C. Date of Service: 10/24/2014 1:15 PM Medical Record Number: Patient Account Number: Michiel Sites Date of Birth/Sex: May 06, 1933 (81 y.o. Male) Treating RN: Primary Care Physician: Other Clinician: Referring Physician: Treating Physician/Extender: BURNS, 12/25/2014 in Treatment: 6 Encounter Discharge Information Items Discharge Pain Level: 0 Discharge Condition: Stable Ambulatory Status: Cane Discharge Destination:  Home Transportation: Private Auto Accompanied By: daughter Schedule Follow-up Appointment: Yes Medication Reconciliation completed Yes and provided to Patient/Care Johnryan Sao: Provided on Clinical Summary of Care: 10/24/2014 Form Type Recipient Paper Patient JA Electronic Signature(s) Signed: 10/24/2014 1:42:44 PM By: 12/25/2014 Entered By: Matthew Foley on 10/24/2014 13:42:44 Graciella Freer (12/24/2014) -------------------------------------------------------------------------------- Multi-Disciplinary Care Plan Details Patient Name: Matthew, Foley. Date of Service: 10/24/2014 1:15 PM Medical Record Number: 13/09/1932 Patient Account Number: (65 Date of Birth/Sex: [birth year] (81 y.o. Male) Treating RN: Regis Bill, RN, BSN, 12/24/2014 Primary Care Physician: 12/24/2014 Other Clinician: Referring Physician: Treating Physician/Extender: BURNS, Gwenlyn Perking in Treatment: 6 Active Inactive Electronic Signature(s) Signed: 10/25/2014 5:21:09 PM By: 12/24/2014 BSN, RN Entered By: Matthew Foley on 10/24/2014 13:39:06 Matthew Foley (12/24/2014) -------------------------------------------------------------------------------- Pain Assessment Details Patient Name: Matthew, Foley. Date of Service: 10/24/2014 1:15 PM Medical Record Number: 13/09/1932 Patient Account Number: (65 Date of Birth/Sex: [birth year] (81 y.o. Male) Treating RN: Dewaine Oats Primary Care Physician: Regis Bill Other Clinician: Referring Physician: Treating Physician/Extender: BURNS, 12/25/2014 in Treatment: 6 Active Problems Location of Pain Severity and Description of Pain Patient Has Paino No Site Locations Pain Management and Medication Current Pain Management: Electronic Signature(s) Signed: 10/24/2014 5:34:22 PM By: Elpidio Eric, RN, BSN, Kim RN, BSN Entered By: 12/24/2014, RN, BSN, Kim on 10/24/2014 13:24:10 [id number]  (Matthew Foley) -------------------------------------------------------------------------------- Wound Assessment Details Patient Name: Matthew, Foley. Date of Service: 10/24/2014 1:15 PM Medical Record Number: [id number] Patient Account Number: 13/09/1932 Date of Birth/Sex: December 15, 1932 (81 y.o. Male) Treating RN: Dewaine Oats Primary Care Physician: Regis Bill Other Clinician: Referring Physician: Treating Physician/Extender: BURNS, 12/24/2014 in Treatment: 6 Wound Status Wound Number: 1 Primary Etiology: Dehisced Wound Wound Location: Left Abdomen - Upper Wound Status: Open Quadrant Wounding Event: Surgical Injury Date Acquired: 02/20/2014 Weeks Of Treatment: 6 Clustered Wound: Yes Photos Photo  Uploaded By: Elliot Gurney, RN, BSN, Kim on 10/24/2014 13:32:16 Wound Measurements Length: (cm) Width: (cm) Depth: (cm) Area: (cm) Volume: (cm) 0 % Reduction in Area: 100% 0 % Reduction in Volume: 100% 0 0 0 Wound Description Full Thickness Without Exposed Classification: Support Structures Periwound Skin Texture Texture Color No Abnormalities Noted: No No Abnormalities Noted: No Moisture No Abnormalities Noted: No Electronic Signature(s) Matthew, Foley ([id number]) Signed: 10/24/2014 5:34:22 PM By: Elliot Gurney, RN, BSN, Kim RN, BSN Entered By: Elliot Gurney, RN, BSN, Kim on 10/24/2014 13:29:18 Matthew Foley ([id number]) -------------------------------------------------------------------------------- Vitals Details Patient Name: Matthew Foley. Date of Service: 10/24/2014 1:15 PM Medical Record Number: [id number] Patient Account Number: [id number] Date of Birth/Sex: Feb 23, 1933 (81 y.o. Male) Treating RN: Huel Coventry Primary Care Physician: Dewaine Oats Other Clinician: Referring Physician: Treating Physician/Extender: BURNS, Regis Bill in Treatment: 6 Vital Signs Time Taken: 13:24 Temperature (F): 97.9 Height (in): 74 Pulse (bpm): 67 Weight (lbs): 156.9 Respiratory Rate  (breaths/min): 18 Body Mass Index (BMI): 20.1 Blood Pressure (mmHg): 114/69 Reference Range: 80 - 120 mg / dl Electronic Signature(s) Signed: 10/24/2014 5:34:22 PM By: Elliot Gurney, RN, BSN, Kim RN, BSN Entered By: Elliot Gurney, RN, BSN, Kim on 10/24/2014 13:26:06

## 2014-12-04 ENCOUNTER — Inpatient Hospital Stay
Admission: EM | Admit: 2014-12-04 | Discharge: 2014-12-06 | DRG: 918 | Disposition: A | Discharge status: Skilled Nursing Facility | Payer: Medicare PPO | Attending: Internal Medicine | Admitting: Internal Medicine

## 2014-12-04 ENCOUNTER — Encounter: Payer: Self-pay | Admitting: *Deleted

## 2014-12-04 DIAGNOSIS — R45851 Suicidal ideations: Secondary | ICD-10-CM | POA: Diagnosis present

## 2014-12-04 DIAGNOSIS — Z046 Encounter for general psychiatric examination, requested by authority: Secondary | ICD-10-CM

## 2014-12-04 DIAGNOSIS — T43222A Poisoning by selective serotonin reuptake inhibitors, intentional self-harm, initial encounter: Principal | ICD-10-CM | POA: Diagnosis present

## 2014-12-04 DIAGNOSIS — F32A Depression, unspecified: Secondary | ICD-10-CM | POA: Diagnosis present

## 2014-12-04 DIAGNOSIS — Z96653 Presence of artificial knee joint, bilateral: Secondary | ICD-10-CM | POA: Diagnosis present

## 2014-12-04 DIAGNOSIS — I499 Cardiac arrhythmia, unspecified: Secondary | ICD-10-CM | POA: Diagnosis present

## 2014-12-04 DIAGNOSIS — I2510 Atherosclerotic heart disease of native coronary artery without angina pectoris: Secondary | ICD-10-CM | POA: Diagnosis present

## 2014-12-04 DIAGNOSIS — E785 Hyperlipidemia, unspecified: Secondary | ICD-10-CM | POA: Diagnosis present

## 2014-12-04 DIAGNOSIS — K219 Gastro-esophageal reflux disease without esophagitis: Secondary | ICD-10-CM | POA: Diagnosis present

## 2014-12-04 DIAGNOSIS — R4589 Other symptoms and signs involving emotional state: Secondary | ICD-10-CM

## 2014-12-04 DIAGNOSIS — Z87891 Personal history of nicotine dependence: Secondary | ICD-10-CM

## 2014-12-04 DIAGNOSIS — E039 Hypothyroidism, unspecified: Secondary | ICD-10-CM

## 2014-12-04 DIAGNOSIS — Z955 Presence of coronary angioplasty implant and graft: Secondary | ICD-10-CM

## 2014-12-04 DIAGNOSIS — I10 Essential (primary) hypertension: Secondary | ICD-10-CM | POA: Diagnosis present

## 2014-12-04 DIAGNOSIS — F329 Major depressive disorder, single episode, unspecified: Secondary | ICD-10-CM | POA: Diagnosis present

## 2014-12-04 DIAGNOSIS — Z7982 Long term (current) use of aspirin: Secondary | ICD-10-CM

## 2014-12-04 DIAGNOSIS — M069 Rheumatoid arthritis, unspecified: Secondary | ICD-10-CM | POA: Diagnosis present

## 2014-12-04 DIAGNOSIS — F332 Major depressive disorder, recurrent severe without psychotic features: Secondary | ICD-10-CM

## 2014-12-04 DIAGNOSIS — E876 Hypokalemia: Secondary | ICD-10-CM | POA: Diagnosis present

## 2014-12-04 DIAGNOSIS — I472 Ventricular tachycardia: Secondary | ICD-10-CM | POA: Diagnosis present

## 2014-12-04 DIAGNOSIS — R4689 Other symptoms and signs involving appearance and behavior: Secondary | ICD-10-CM

## 2014-12-04 DIAGNOSIS — T50902A Poisoning by unspecified drugs, medicaments and biological substances, intentional self-harm, initial encounter: Secondary | ICD-10-CM

## 2014-12-04 DIAGNOSIS — I4729 Other ventricular tachycardia: Secondary | ICD-10-CM

## 2014-12-04 HISTORY — DX: Essential (primary) hypertension: I10

## 2014-12-04 HISTORY — DX: Hyperlipidemia, unspecified: E78.5

## 2014-12-04 HISTORY — DX: Major depressive disorder, single episode, unspecified: F32.9

## 2014-12-04 HISTORY — DX: Personal history of other diseases of the digestive system: Z87.19

## 2014-12-04 HISTORY — DX: Hypothyroidism, unspecified: E03.9

## 2014-12-04 HISTORY — DX: Rheumatoid arthritis, unspecified: M06.9

## 2014-12-04 HISTORY — DX: Atherosclerotic heart disease of native coronary artery without angina pectoris: I25.10

## 2014-12-04 HISTORY — DX: Depression, unspecified: F32.A

## 2014-12-04 HISTORY — DX: Gastro-esophageal reflux disease without esophagitis: K21.9

## 2014-12-04 LAB — CBC WITH DIFFERENTIAL/PLATELET
Basophils Absolute: 0 10*3/uL (ref 0–0.1)
Basophils Relative: 1 %
Eosinophils Absolute: 0 10*3/uL (ref 0–0.7)
Eosinophils Relative: 1 %
HCT: 34.1 % — ABNORMAL LOW (ref 40.0–52.0)
Hemoglobin: 11 g/dL — ABNORMAL LOW (ref 13.0–18.0)
LYMPHS ABS: 1.3 10*3/uL (ref 1.0–3.6)
LYMPHS PCT: 41 %
MCH: 29.9 pg (ref 26.0–34.0)
MCHC: 32.2 g/dL (ref 32.0–36.0)
MCV: 92.9 fL (ref 80.0–100.0)
Monocytes Absolute: 0.3 10*3/uL (ref 0.2–1.0)
Monocytes Relative: 10 %
NEUTROS PCT: 47 %
Neutro Abs: 1.5 10*3/uL (ref 1.4–6.5)
PLATELETS: 135 10*3/uL — AB (ref 150–440)
RBC: 3.67 MIL/uL — AB (ref 4.40–5.90)
RDW: 15.1 % — ABNORMAL HIGH (ref 11.5–14.5)
WBC: 3.1 10*3/uL — AB (ref 3.8–10.6)

## 2014-12-04 LAB — MAGNESIUM: Magnesium: 1.5 mg/dL — ABNORMAL LOW (ref 1.7–2.4)

## 2014-12-04 LAB — COMPREHENSIVE METABOLIC PANEL
ALT: 11 U/L — ABNORMAL LOW (ref 17–63)
AST: 26 U/L (ref 15–41)
Albumin: 3.1 g/dL — ABNORMAL LOW (ref 3.5–5.0)
Alkaline Phosphatase: 62 U/L (ref 38–126)
Anion gap: 11 (ref 5–15)
BUN: 11 mg/dL (ref 6–20)
CHLORIDE: 104 mmol/L (ref 101–111)
CO2: 27 mmol/L (ref 22–32)
Calcium: 8.5 mg/dL — ABNORMAL LOW (ref 8.9–10.3)
Creatinine, Ser: 1.15 mg/dL (ref 0.61–1.24)
GFR, EST NON AFRICAN AMERICAN: 59 mL/min — AB (ref >60)
Glucose, Bld: 84 mg/dL (ref 65–99)
POTASSIUM: 3 mmol/L — AB (ref 3.5–5.1)
SODIUM: 142 mmol/L (ref 135–145)
Total Bilirubin: 0.3 mg/dL (ref 0.3–1.2)
Total Protein: 6.2 g/dL — ABNORMAL LOW (ref 6.5–8.1)

## 2014-12-04 LAB — URINE DRUG SCREEN, QUALITATIVE (ARMC ONLY)
AMPHETAMINES, UR SCREEN: NOT DETECTED
Barbiturates, Ur Screen: NOT DETECTED
Benzodiazepine, Ur Scrn: POSITIVE — AB
COCAINE METABOLITE, UR ~~LOC~~: NOT DETECTED
Cannabinoid 50 Ng, Ur ~~LOC~~: NOT DETECTED
MDMA (ECSTASY) UR SCREEN: NOT DETECTED
Methadone Scn, Ur: NOT DETECTED
Opiate, Ur Screen: NOT DETECTED
PHENCYCLIDINE (PCP) UR S: NOT DETECTED
TRICYCLIC, UR SCREEN: NOT DETECTED

## 2014-12-04 LAB — URINALYSIS COMPLETE WITH MICROSCOPIC (ARMC ONLY)
BILIRUBIN URINE: NEGATIVE
Bacteria, UA: NONE SEEN
Glucose, UA: NEGATIVE mg/dL
KETONES UR: NEGATIVE mg/dL
Leukocytes, UA: NEGATIVE
Nitrite: NEGATIVE
PH: 7 (ref 5.0–8.0)
PROTEIN: NEGATIVE mg/dL
SPECIFIC GRAVITY, URINE: 1.004 — AB (ref 1.005–1.030)
Squamous Epithelial / LPF: NONE SEEN
WBC, UA: NONE SEEN WBC/hpf (ref 0–5)

## 2014-12-04 LAB — ETHANOL: Alcohol, Ethyl (B): 22 mg/dL — ABNORMAL HIGH (ref <5)

## 2014-12-04 LAB — ACETAMINOPHEN LEVEL: Acetaminophen (Tylenol), Serum: <10 ug/mL — ABNORMAL LOW (ref 10–30)

## 2014-12-04 LAB — SALICYLATE LEVEL: Salicylate Lvl: <4 mg/dL (ref 2.8–30.0)

## 2014-12-04 LAB — TROPONIN I: Troponin I: 0.04 ng/mL — ABNORMAL HIGH (ref <0.031)

## 2014-12-04 LAB — T4, FREE: Free T4: 0.99 ng/dL (ref 0.61–1.12)

## 2014-12-04 MED ORDER — MAGNESIUM SULFATE 2 GM/50ML IV SOLN
INTRAVENOUS | Status: AC
  Administered 2014-12-04: 2 g via INTRAVENOUS
  Filled 2014-12-04: qty 50

## 2014-12-04 MED ORDER — MAGNESIUM SULFATE 2 GM/50ML IV SOLN
2.0000 g | Freq: Once | INTRAVENOUS | Status: AC
  Administered 2014-12-04: 2 g via INTRAVENOUS

## 2014-12-04 MED ORDER — ALPRAZOLAM 0.5 MG PO TABS
ORAL_TABLET | ORAL | Status: AC
  Administered 2014-12-04: 0.25 mg via ORAL
  Filled 2014-12-04: qty 1

## 2014-12-04 MED ORDER — PANTOPRAZOLE SODIUM 40 MG PO TBEC
DELAYED_RELEASE_TABLET | ORAL | Status: AC
  Administered 2014-12-04: 40 mg via ORAL
  Filled 2014-12-04: qty 1

## 2014-12-04 MED ORDER — LISINOPRIL 5 MG PO TABS
ORAL_TABLET | ORAL | Status: AC
  Administered 2014-12-04: 2.5 mg via ORAL
  Filled 2014-12-04: qty 1

## 2014-12-04 MED ORDER — LEVOTHYROXINE SODIUM 75 MCG PO TABS
75.0000 ug | ORAL_TABLET | Freq: Every day | ORAL | Status: DC
  Administered 2014-12-05 – 2014-12-06 (×2): 75 ug via ORAL
  Filled 2014-12-04 (×5): qty 1

## 2014-12-04 MED ORDER — SODIUM CHLORIDE 0.9 % IV BOLUS (SEPSIS)
500.0000 mL | Freq: Once | INTRAVENOUS | Status: AC
  Administered 2014-12-04: 500 mL via INTRAVENOUS

## 2014-12-04 MED ORDER — LISINOPRIL 5 MG PO TABS
2.5000 mg | ORAL_TABLET | Freq: Every day | ORAL | Status: DC
  Administered 2014-12-04: 2.5 mg via ORAL
  Filled 2014-12-04: qty 0.5

## 2014-12-04 MED ORDER — MAGNESIUM SULFATE 2 GM/50ML IV SOLN
INTRAVENOUS | Status: AC
Start: 2014-12-04 — End: 2014-12-05
  Administered 2014-12-04: 2 g via INTRAVENOUS
  Filled 2014-12-04: qty 50

## 2014-12-04 MED ORDER — PANTOPRAZOLE SODIUM 40 MG PO TBEC
40.0000 mg | DELAYED_RELEASE_TABLET | Freq: Every day | ORAL | Status: DC
  Administered 2014-12-04 – 2014-12-06 (×3): 40 mg via ORAL
  Filled 2014-12-04 (×2): qty 1

## 2014-12-04 MED ORDER — ALPRAZOLAM 0.25 MG PO TABS
0.2500 mg | ORAL_TABLET | Freq: Two times a day (BID) | ORAL | Status: DC
  Administered 2014-12-04: 0.25 mg via ORAL

## 2014-12-04 MED ORDER — SODIUM CHLORIDE 0.9 % IV BOLUS (SEPSIS): 500.0000 mL | Freq: Once | INTRAVENOUS | Status: AC

## 2014-12-04 NOTE — ED Provider Notes (Addendum)
[facility] Emergency Department Provider Note  ____________________________________________  Time seen: Approximately 3:41 PM  I have reviewed the triage vital signs and the nursing notes.   HISTORY  Chief Complaint Drug Overdose  History is somewhat limited by the patient's somnolence after his ingestion  HPI Matthew Foley is a 81 y.o. male with a history of depression, hypothyroidism, and possible dementia who presents after an intentional drug overdose of his Paxil.  He states that he has been depressed recently and lost his wife 3 years ago.  He was sitting alone in his apartment and thinking about her when he impulsively took the bottle of Paxil, which consisted of 29 tablets of 20 mg each.  His ingestion occurred approximately 75 minutes prior to arrival at 2:30 PM.  He states that he does not really know why he did it except that he is very depressed.  He does not answer when asked if it was an attempt to kill himself.  He states that he feels fine and better than before, but he is somnolent and has slurred speech.  He also admitted to having "one screwdriver "earlier today.  He denies any other ingestions.  He denies chest pain, shortness of breath, headache, abdominal pain.   Past Medical History  Diagnosis Date  . Depression   . Hypertension   . Hyperlipidemia   . CAD (coronary artery disease)     at least 1 stent  . Hx of pancreatitis   . Rheumatoid arthritis   . Hypothyroidism   . GERD (gastroesophageal reflux disease)     There are no active problems to display for this patient.   Past Surgical History  Procedure Laterality Date  . Knee replacement Bilateral   . Cholecystectomy    . Hernia repair    . Toe amputation      Right, 2nd toe    No current outpatient prescriptions on file.  Allergies Review of patient's allergies indicates not on file.  History reviewed. No pertinent family history.  Social History History   Substance Use Topics  . Smoking status: Former Games developer  . Smokeless tobacco: Not on file  . Alcohol Use: Yes    Review of Systems Constitutional: No fever/chills Eyes: No visual changes. ENT: No sore throat. Cardiovascular: Denies chest pain. Respiratory: Denies shortness of breath. Gastrointestinal: No abdominal pain.  No nausea, no vomiting.  No diarrhea.  No constipation. Genitourinary: Negative for dysuria. Musculoskeletal: Negative for back pain. Skin: Negative for rash. Neurological: Negative for headaches, focal weakness or numbness. Psychiatric:Severe depression  10-point ROS otherwise negative.  ____________________________________________   PHYSICAL EXAM:  VITAL SIGNS: ED Triage Vitals  Enc Vitals Group     BP 12/04/14 1540 86/62 mmHg     Pulse Rate 12/04/14 1540 71     Resp 12/04/14 1540 16     Temp 12/04/14 1540 97.6 F (36.4 C)     Temp Source 12/04/14 1540 Oral     SpO2 12/04/14 1540 94 %     Weight 12/04/14 1540 150 lb (68.04 kg)     Height 12/04/14 1540 6\' 2"  (1.88 m)     Head Cir --      Peak Flow --      Pain Score --      Pain Loc --      Pain Edu? --      Excl. in GC? --     Constitutional: Alert and oriented but somnolent with slurred speech.  No  acute distress Eyes: Conjunctivae are normal.  Sluggish pupils bilaterally. EOMI. Head: Atraumatic. Nose: No congestion/rhinnorhea. Mouth/Throat: Mucous membranes are moist.  Oropharynx non-erythematous. Neck: No stridor.   Cardiovascular: Normal rate, regular rhythm. Grossly normal heart sounds.  Good peripheral circulation. Respiratory: Normal respiratory effort.  No retractions. Lungs CTAB. Gastrointestinal: Soft and nontender. No distention. No abdominal bruits. No CVA tenderness. Musculoskeletal: No lower extremity tenderness nor edema.  No joint effusions. Neurologic: Moving all 4 extremities.  No cranial nerve deficits except for slurred speech which may be secondary to drug ingestion or  alcohol   Skin:  Skin is warm, dry and intact. No rash noted. Psychiatric:  Endorses depression, vague about his suicidality, clearly intentional overdose attempt. ____________________________________________   LABS (all labs ordered are listed, but only abnormal results are displayed)  Labs Reviewed  COMPREHENSIVE METABOLIC PANEL - Abnormal; Notable for the following:    Potassium 3.0 (*)    Calcium 8.5 (*)    Total Protein 6.2 (*)    Albumin 3.1 (*)    ALT 11 (*)    GFR calc non Af Amer 59 (*)    All other components within normal limits  CBC WITH DIFFERENTIAL/PLATELET - Abnormal; Notable for the following:    WBC 3.1 (*)    RBC 3.67 (*)    Hemoglobin 11.0 (*)    HCT 34.1 (*)    RDW 15.1 (*)    Platelets 135 (*)    All other components within normal limits  MAGNESIUM - Abnormal; Notable for the following:    Magnesium 1.5 (*)    All other components within normal limits  ACETAMINOPHEN LEVEL  ETHANOL  SALICYLATE LEVEL  URINALYSIS COMPLETEWITH MICROSCOPIC ([facility] ONLY)  URINE DRUG SCREEN, QUALITATIVE ([facility] ONLY)  T4, FREE   ____________________________________________  EKG  ED ECG REPORT I, Brittian Renaldo, the attending physician, personally viewed and interpreted this ECG.   Date: 12/04/2014  EKG Time: 15:38  Rate: 74  Rhythm: normal sinus rhythm  Axis: Normal  Intervals:Bifascicular block, slightly wide QRS  ST&T Change: Non-specific ST segment / T-wave changes, but no evidence of acute ischemia.  Compared to an EKG approximately 10 months ago on 01/21/2014, the QTC is slightly prolonged at 523 ms.  The QRS duration is essentially the same at 146 ms.  The bifascicular block was present in the old EKG as well   ED ECG REPORT #2 I, Blimy Napoleon, the attending physician, personally viewed and interpreted this ECG.   Date: 12/04/2014  EKG Time: 18:48  Rate: 94  Rhythm: normal sinus rhythm  Axis: normal  Intervals:bifascicular block  ST&T Change: Artifact is  present and the QTC has increased slightly to 537 ms.  Still no evidence of acute ischemia   ED ECG REPORT #3 I, Tynia Wiers, the attending physician, personally viewed and interpreted this ECG.   Date: 12/04/2014  EKG Time: 22:54  Rate: 86  Rhythm: normal sinus rhythm  Axis: Left axis deviation  Intervals:Bifascicular block  ST&T Change: Essentially unchanged from prior except for occasional PVC and again slightly increased QTC at 555 ms.  ____________________________________________  RADIOLOGY  No results found.  ____________________________________________   PROCEDURES  Procedure(s) performed: None  Critical Care performed: Yes, see critical care note(s)   CRITICAL CARE Performed by: Loleta Rose   Total critical care time: 45 minutes  Critical care time was exclusive of separately billable procedures and treating other patients.  Critical care was necessary to treat or prevent imminent or life-threatening deterioration.  Critical care was time spent personally by me on the following activities: development of treatment plan with patient and/or surrogate as well as nursing, discussions with consultants, evaluation of patient's response to treatment, examination of patient, obtaining history from patient or surrogate, ordering and performing treatments and interventions, ordering and review of laboratory studies, ordering and review of radiographic studies, pulse oximetry and re-evaluation of patient's condition.  ____________________________________________   INITIAL IMPRESSION / ASSESSMENT AND PLAN / ED COURSE  Pertinent labs & imaging results that were available during my care of the patient were reviewed by me and considered in my medical decision making (see chart for details).  Hypertensive and somnolent elderly male with intentional drug overdose.  I called and spoke with [place] poison control and discussed the case based on what we know at his initial  presentation.  There is little intervention that we can do at this time other than supportive care for his blood pressure.  He is currently maintaining his airway and there is no indication for intubation.  There is no indication for charcoal as the ingestion was approximately 75 minutes prior to arrival and given his somnolence he is an aspiration risk.  We will keep him on cardiac monitoring and pulse oximetry and investigate other possible coingestions.  I reviewed his prior medical records and he does not take any other potentially lethal medications of which I am aware at this time.  ----------------------------------------- 4:54 PM on 12/04/2014 -----------------------------------------  The patient's blood pressure has improved, it is now 123/84 after 1 L of normal saline.  His mental status has improved slightly as well although he is still somewhat somnolent.  His daughter reports that he is done this in the past and thinks that his prior overdose was on lorazepam.  We will continue with careful observation.  The patient has been involuntarily committed.  ----------------------------------------- 11:09 PM on 12/04/2014 ----------------------------------------- (Note that documentation was delayed due to multiple ED patients requiring immediate care.)  I continued to monitor the patient over the last few hours.  He has been resting comfortably and is in no acute distress.  I repeated 2 EKGs as documented above and each time his QTC is slightly more prolonged.  I gave him 2 g of magnesium sulfate and have ordered an additional 2 g.  IV also ordered repeat lab work for the beginning of the day shift in the morning to determine any changes in his electrolytes and his CBC.  These should be checked by the a.m. doctor.  He remains hemodynamically stable.  At no point has he complained of chest pain nor dyspnea, but given the cardiac concerns, I am sending a troponin at this  time.  ----------------------------------------- 12:10 AM on 12/05/2014 -----------------------------------------  The patient's troponin is 0.04 but it has been elevated in the past.  I will also add on a repeat troponin in the morning with the rest of his labs.  ____________________________________________  FINAL CLINICAL IMPRESSION(S) / ED DIAGNOSES  Final diagnoses:  Intentional drug overdose, initial encounter  Involuntary commitment      NEW MEDICATIONS STARTED DURING THIS VISIT:  New Prescriptions   No medications on file     Loleta Rose, MD 12/04/14 2320  Loleta Rose, MD 12/05/14 0010

## 2014-12-04 NOTE — ED Notes (Signed)
pts daughter states this has happened before, pt got very depressed and "tired of everything", daughter states he overdosed then and she believes it was lorazpam

## 2014-12-04 NOTE — BH Assessment (Signed)
Assessment Note  Matthew Foley is an 81 y.o. male, who presents to the ED via EMS for c/o, "I took some pills; my daughter came in and I told her what happened; she called the ambulance; I live alone; my wife died 11/24/2011; she had an anuerysm; I have not been to sleep in three nights; I might have gotten 6 hours; I overdosed 4 years ago on xanax; I was dealing with depression; I have an appointment with a psychiatrist here on 12/17/2014; it's hard being alone.".   Axis I: Major Depression, Recurrent severe Axis II: Deferred Axis III:  Past Medical History  Diagnosis Date  . Depression   . Hypertension   . Hyperlipidemia   . CAD (coronary artery disease)     at least 1 stent  . Hx of pancreatitis   . Rheumatoid arthritis   . Hypothyroidism   . GERD (gastroesophageal reflux disease)    Axis IV: other psychosocial or environmental problems, problems related to social environment and problems with access to health care services Axis V: 11-20 some danger of hurting self or others possible OR occasionally fails to maintain minimal personal hygiene OR gross impairment in communication  Past Medical History:  Past Medical History  Diagnosis Date  . Depression   . Hypertension   . Hyperlipidemia   . CAD (coronary artery disease)     at least 1 stent  . Hx of pancreatitis   . Rheumatoid arthritis   . Hypothyroidism   . GERD (gastroesophageal reflux disease)     Past Surgical History  Procedure Laterality Date  . Knee replacement Bilateral   . Cholecystectomy    . Hernia repair    . Toe amputation      Right, 2nd toe    Family History: History reviewed. No pertinent family history.  Social History:  reports that he has quit smoking. He does not have any smokeless tobacco history on file. He reports that he drinks alcohol. His drug history is not on file.  Additional Social History:     CIWA: CIWA-Ar BP: (!) 141/80 mmHg Pulse Rate: 91 COWS:    Allergies: Not on  File  Home Medications:  (Not in a hospital admission)  OB/GYN Status:  No LMP for male patient.  General Assessment Data Location of Assessment: [facility] ED TTS Assessment: In system Is this a Tele or Face-to-Face Assessment?: Face-to-Face Is this an Initial Assessment or a Re-assessment for this encounter?: Re-Assessment Marital status: Widowed [place] name:  (none) Is patient pregnant?: No Pregnancy Status: No Living Arrangements: Alone Can pt return to current living arrangement?: Yes Admission Status: Involuntary Is patient capable of signing voluntary admission?: Yes Referral Source: Self/Family/Friend Insurance type: Medicare--[employer]  Medical Screening Exam [facility] Walk-in ONLY) Medical Exam completed: Yes  Crisis Care Plan Living Arrangements: Alone Name of Psychiatrist: none Name of Therapist: none  Education Status Is patient currently in school?: No Current Grade: n/ Name of school: n/a Contact person: 2--daughters  Risk to self with the past 6 months Suicidal Ideation: Yes-Currently Present Has patient been a risk to self within the past 6 months prior to admission? : Yes Suicidal Intent: Yes-Currently Present Has patient had any suicidal intent within the past 6 months prior to admission? : Yes Is patient at risk for suicide?: Yes Suicidal Plan?: Yes-Currently Present Has patient had any suicidal plan within the past 6 months prior to admission? : Yes Specify Current Suicidal Plan: overdose pf paroxetine Access to Means: Yes Specify Access  to Suicidal Means: medications What has been your use of drugs/alcohol within the last 12 months?: none Previous Attempts/Gestures: Yes How many times?: 1 Other Self Harm Risks: living alone; wife died 10/30/2011 Triggers for Past Attempts: Other personal contacts Intentional Self Injurious Behavior: None Family Suicide History: No Recent stressful life event(s): Job Loss Persecutory voices/beliefs?: No Depression:  Yes Depression Symptoms: Despondent, Insomnia, Loss of interest in usual pleasures Substance abuse history and/or treatment for substance abuse?: No Suicide prevention information given to non-admitted patients: Yes  Risk to Others within the past 6 months Homicidal Ideation: No Does patient have any lifetime risk of violence toward others beyond the six months prior to admission? : No Thoughts of Harm to Others: No Current Homicidal Intent: No Current Homicidal Plan: No Access to Homicidal Means: No Identified Victim: none History of harm to others?: No Assessment of Violence: On admission Violent Behavior Description: none Does patient have access to weapons?: No Criminal Charges Pending?: No Does patient have a court date: No Is patient on probation?: No  Psychosis Hallucinations: None noted Delusions: None noted  Mental Status Report Appearance/Hygiene: In scrubs, Unremarkable Eye Contact: Fair Motor Activity: Unremarkable Speech: Soft, Slow Level of Consciousness: Quiet/awake, Alert Mood: Depressed, Sad Affect: Depressed, Sad Anxiety Level: None Thought Processes: Circumstantial Judgement: Partial Orientation: Person, Place, Situation Obsessive Compulsive Thoughts/Behaviors: Minimal  Cognitive Functioning Concentration: Fair Memory: Recent Intact, Remote Intact IQ: Average Insight: Fair Impulse Control: Fair Appetite: Fair Weight Loss: 0 Weight Gain: 0 Sleep: Decreased ("I have not slept in 3 days.") Total Hours of Sleep: 2 Vegetative Symptoms: None  ADLScreening [facility] Assessment Services) Patient's cognitive ability adequate to safely complete daily activities?: Yes Patient able to express need for assistance with ADLs?: Yes Independently performs ADLs?: Yes (appropriate for developmental age)  Prior Inpatient Therapy Prior Inpatient Therapy: Yes Prior Therapy Dates: 4 years ago Prior Therapy Facilty/Provider(s): [facility] Reason for Treatment:  Depression  Prior Outpatient Therapy Prior Outpatient Therapy: Yes Prior Therapy Dates: unknown Prior Therapy Facilty/Provider(s): Dr. Maryruth Bun Reason for Treatment: Depression Does patient have an ACCT team?: No Does patient have Intensive In-House Services?  : No Does patient have [facility] services? : No Does patient have P4CC services?: No  ADL Screening (condition at time of admission) Patient's cognitive ability adequate to safely complete daily activities?: Yes Patient able to express need for assistance with ADLs?: Yes Independently performs ADLs?: Yes (appropriate for developmental age)       Abuse/Neglect Assessment (Assessment to be complete while patient is alone) Physical Abuse: Denies Verbal Abuse: Denies Sexual Abuse: Denies Exploitation of patient/patient's resources: Denies Self-Neglect: Denies Values / Beliefs Cultural Requests During Hospitalization: None Spiritual Requests During Hospitalization: None Consults Spiritual Care Consult Needed: No Social Work Consult Needed: No      Additional Information 1:1 In Past 12 Months?: No CIRT Risk: No Elopement Risk: No Does patient have medical clearance?: Yes  Child/Adolescent Assessment Running Away Risk: Denies Bed-Wetting: Denies Destruction of Property: Denies Cruelty to Animals: Denies Stealing: Denies Rebellious/Defies Authority: Denies Satanic Involvement: Denies Archivist: Denies Problems at [employer]: Denies Gang Involvement: Denies  Disposition:  Disposition Initial Assessment Completed for this Encounter: Yes Disposition of Patient: Referred to (Psych MD to see) Patient referred to: Other (Comment) (Consult)  On Site Evaluation by:   Reviewed with Physician:    Dwan Bolt 12/04/2014 6:03 PM

## 2014-12-04 NOTE — ED Notes (Signed)
BEHAVIORAL HEALTH ROUNDING Patient sleeping: No. Patient alert and oriented: no Behavior appropriate: Yes.   Nutrition and fluids offered: Yes  Toileting and hygiene offered: Yes  Sitter present: yes Law enforcement present: Yes   

## 2014-12-04 NOTE — ED Notes (Addendum)
Pt arrives via EMs from home, caregiver states she saw him turn the bottle of paroxetine up and swallowed 29, pt states "I am depressed, my wife died and I am in that house alone", pt slurred speech upon assessment, awake and alert, pt also states he ingested ETOH, a screwdriver drink

## 2014-12-04 NOTE — ED Notes (Signed)
Pt resting in bed, respirations even and unlabored, pt easily aroused to verbal ques

## 2014-12-04 NOTE — ED Notes (Signed)
BEHAVIORAL HEALTH ROUNDING Patient sleeping: No. Patient alert and oriented: no Behavior appropriate: Yes.   Nutrition and fluids offered: Yes  Toileting and hygiene offered: Yes  Sitter present: yes Law enforcement present: Yes   ENVIRONMENTAL ASSESSMENT Potentially harmful objects out of patient reach: Yes.   Personal belongings secured: Yes.   Patient dressed in hospital provided attire only: Yes.   Plastic bags out of patient reach: Yes.   Patient care equipment (cords, cables, call bells, lines, and drains) shortened, removed, or accounted for: Yes.   Equipment and supplies removed from bottom of stretcher: Yes.   Potentially toxic materials out of patient reach: Yes.   Sharps container removed or out of patient reach: Yes.     

## 2014-12-04 NOTE — Consult Note (Signed)
[facility] Face-to-Face Psychiatry Consult   Reason for Consult:  Consult for this 81 year old man with a history of treatment of depression who took an overdose of Paxil today with suicidal intent Referring Physician:  Karma Greaser Patient Identification: Matthew Foley MRN:  [id number] Principal Diagnosis: Major depression Diagnosis:   Patient Active Problem List   Diagnosis Date Noted  . Major depression [F32.2] 12/04/2014  . Suicidal behavior [F48.9] 12/04/2014  . Hypothyroid [E03.9] 12/04/2014  . Hypertension [I10] 12/04/2014    Total Time spent with patient: 1 hour  Subjective:   Matthew Foley is a 81 y.o. male patient admitted with "I took a bunch of pills". Patient admits that he impulsively took all of his Paxil today.[name]  HPI:  Information from the patient and the chart. Patient says that he has been feeling depressed for 3 years ever since his wife died but it sounds it's been getting worse recently. The depression isn't the same every day some days are better than others. Today was just a particularly bad day. He can't even remember what his thought process was but he had a bottle of Paxil that it just gotten delivered from the pharmacy. Apparently he took the entire month's supply. When his daughter's friend who helps out around the house and helps to fill his medicine came by was the overdose discovered. Patient had also been drinking alcohol at the time had had a vodka and orange juice. Patient's sleep is been poor. Appetite is been poor. Has had intermittent suicidal thoughts. Denies any hallucinations.  Past psychiatric history: Is been treated for depression in the past. He used to see Dr.Kapur until she stopped taking his insurance. Currently he gets his treatment from Dr. Hall Busing who is his primary care doctor. Patient reports he has 1 suicide attempt about 4-1/2 years ago which is before his wife died. Claims he's never had a psychiatric hospitalization. No history of psychosis  or mania reported. Thinks she's been on his current medicine for many months at least.  Social history: Patient lives with his daughter area and she works during the day. She has a friend who also apparently helps out around the house. Patient's wife died about 3 years ago. He says that most days he is able to get up and do a little housework but some days he just stays in bed.  Medical history: History of hypothyroidism. History of prior obesity but he says he is lost about 80 pounds in the last couple of years. History of high blood pressure. Gastric reflux symptoms.  Family history: Thinks his mother had nerve or depression problems  Substance abuse history: He denies that he is ever had an alcohol problem in the past. Denies any history of drug problems. Admits that he drinks occasionally and did have some alcohol today.  Current medication alprazolam 0.25 mg, Synthroid 75 g, lisinopril 2.5 mg, Prevacid daily HPI Elements:   Quality:  Depressed mood with acute suicidal behavior. Severity:  Severe with suicidal intent. Timing:  Worse just in the last day or so. Duration:  Depression's been present for months to years. Context:  Chronic grief over the death of his wife.  Past Medical History:  Past Medical History  Diagnosis Date  . Depression   . Hypertension   . Hyperlipidemia   . CAD (coronary artery disease)     at least 1 stent  . Hx of pancreatitis   . Rheumatoid arthritis   . Hypothyroidism   . GERD (gastroesophageal reflux disease)  Past Surgical History  Procedure Laterality Date  . Knee replacement Bilateral   . Cholecystectomy    . Hernia repair    . Toe amputation      Right, 2nd toe   Family History: History reviewed. No pertinent family history. Social History:  History  Alcohol Use  . Yes     History  Drug Use Not on file    History   Social History  . Marital Status: Widowed    Spouse Name: N/A  . Number of Children: N/A  . Years of  Education: N/A   Social History Main Topics  . Smoking status: Former Research scientist (life sciences)  . Smokeless tobacco: Not on file  . Alcohol Use: Yes  . Drug Use: Not on file  . Sexual Activity: Not on file   Other Topics Concern  . None   Social History Narrative  . None   Additional Social History:                          Allergies:  Not on File  Labs:  Results for orders placed or performed during the hospital encounter of 12/04/14 (from the past 48 hour(s))  Acetaminophen level     Status: Abnormal   Collection Time: 12/04/14  3:43 PM  Result Value Ref Range   Acetaminophen (Tylenol), Serum <10 (L) 10 - 30 ug/mL    Comment:        THERAPEUTIC CONCENTRATIONS VARY SIGNIFICANTLY. A RANGE OF 10-30 ug/mL MAY BE AN EFFECTIVE CONCENTRATION FOR MANY PATIENTS. HOWEVER, SOME ARE BEST TREATED AT CONCENTRATIONS OUTSIDE THIS RANGE. ACETAMINOPHEN CONCENTRATIONS >150 ug/mL AT 4 HOURS AFTER INGESTION AND >50 ug/mL AT 12 HOURS AFTER INGESTION ARE OFTEN ASSOCIATED WITH TOXIC REACTIONS.   Comprehensive metabolic panel     Status: Abnormal   Collection Time: 12/04/14  3:43 PM  Result Value Ref Range   Sodium 142 135 - 145 mmol/L   Potassium 3.0 (L) 3.5 - 5.1 mmol/L   Chloride 104 101 - 111 mmol/L   CO2 27 22 - 32 mmol/L   Glucose, Bld 84 65 - 99 mg/dL   BUN 11 6 - 20 mg/dL   Creatinine, Ser 1.15 0.61 - 1.24 mg/dL   Calcium 8.5 (L) 8.9 - 10.3 mg/dL   Total Protein 6.2 (L) 6.5 - 8.1 g/dL   Albumin 3.1 (L) 3.5 - 5.0 g/dL   AST 26 15 - 41 U/L   ALT 11 (L) 17 - 63 U/L   Alkaline Phosphatase 62 38 - 126 U/L   Total Bilirubin 0.3 0.3 - 1.2 mg/dL   GFR calc non Af Amer 59 (L) >60 mL/min   GFR calc Af Amer >60 >60 mL/min    Comment: (NOTE) The eGFR has been calculated using the CKD EPI equation. This calculation has not been validated in all clinical situations. eGFR's persistently <60 mL/min signify possible Chronic Kidney Disease.    Anion gap 11 5 - 15  Ethanol (ETOH)     Status:  Abnormal   Collection Time: 12/04/14  3:43 PM  Result Value Ref Range   Alcohol, Ethyl (B) 22 (H) <5 mg/dL    Comment:        LOWEST DETECTABLE LIMIT FOR SERUM ALCOHOL IS 5 mg/dL FOR MEDICAL PURPOSES ONLY   Salicylate level     Status: None   Collection Time: 12/04/14  3:43 PM  Result Value Ref Range   Salicylate Lvl <1.6 2.8 - 30.0 mg/dL  CBC with Differential/Platelet     Status: Abnormal   Collection Time: 12/04/14  3:43 PM  Result Value Ref Range   WBC 3.1 (L) 3.8 - 10.6 K/uL   RBC 3.67 (L) 4.40 - 5.90 MIL/uL   Hemoglobin 11.0 (L) 13.0 - 18.0 g/dL   HCT 34.1 (L) 40.0 - 52.0 %   MCV 92.9 80.0 - 100.0 fL   MCH 29.9 26.0 - 34.0 pg   MCHC 32.2 32.0 - 36.0 g/dL   RDW 15.1 (H) 11.5 - 14.5 %   Platelets 135 (L) 150 - 440 K/uL   Neutrophils Relative % 47 %   Neutro Abs 1.5 1.4 - 6.5 K/uL   Lymphocytes Relative 41 %   Lymphs Abs 1.3 1.0 - 3.6 K/uL   Monocytes Relative 10 %   Monocytes Absolute 0.3 0.2 - 1.0 K/uL   Eosinophils Relative 1 %   Eosinophils Absolute 0.0 0 - 0.7 K/uL   Basophils Relative 1 %   Basophils Absolute 0.0 0 - 0.1 K/uL  Magnesium     Status: Abnormal   Collection Time: 12/04/14  3:43 PM  Result Value Ref Range   Magnesium 1.5 (L) 1.7 - 2.4 mg/dL  Urinalysis complete, with microscopic ([facility] only)     Status: Abnormal   Collection Time: 12/04/14  3:43 PM  Result Value Ref Range   Color, Urine YELLOW (A) YELLOW   APPearance CLEAR (A) CLEAR   Glucose, UA NEGATIVE NEGATIVE mg/dL   Bilirubin Urine NEGATIVE NEGATIVE   Ketones, ur NEGATIVE NEGATIVE mg/dL   Specific Gravity, Urine 1.004 (L) 1.005 - 1.030   Hgb urine dipstick 1+ (A) NEGATIVE   pH 7.0 5.0 - 8.0   Protein, ur NEGATIVE NEGATIVE mg/dL   Nitrite NEGATIVE NEGATIVE   Leukocytes, UA NEGATIVE NEGATIVE   RBC / HPF 0-5 0 - 5 RBC/hpf   WBC, UA NONE SEEN 0 - 5 WBC/hpf   Bacteria, UA NONE SEEN NONE SEEN   Squamous Epithelial / LPF NONE SEEN NONE SEEN  Urine Drug Screen, Qualitative ([facility] only)      Status: Abnormal   Collection Time: 12/04/14  3:43 PM  Result Value Ref Range   Tricyclic, Ur Screen NONE DETECTED NONE DETECTED   Amphetamines, Ur Screen NONE DETECTED NONE DETECTED   MDMA (Ecstasy)Ur Screen NONE DETECTED NONE DETECTED   Cocaine Metabolite,Ur [place] NONE DETECTED NONE DETECTED   Opiate, Ur Screen NONE DETECTED NONE DETECTED   Phencyclidine (PCP) Ur S NONE DETECTED NONE DETECTED   Cannabinoid 50 Ng, Ur  NONE DETECTED NONE DETECTED   Barbiturates, Ur Screen NONE DETECTED NONE DETECTED   Benzodiazepine, Ur Scrn POSITIVE (A) NONE DETECTED   Methadone Scn, Ur NONE DETECTED NONE DETECTED    Comment: (NOTE) 010  Tricyclics, urine               Cutoff 1000 ng/mL 200  Amphetamines, urine             Cutoff 1000 ng/mL 300  MDMA (Ecstasy), urine           Cutoff 500 ng/mL 400  Cocaine Metabolite, urine       Cutoff 300 ng/mL 500  Opiate, urine                   Cutoff 300 ng/mL 600  Phencyclidine (PCP), urine      Cutoff 25 ng/mL 700  Cannabinoid, urine              Cutoff 50  ng/mL 800  Barbiturates, urine             Cutoff 200 ng/mL 900  Benzodiazepine, urine           Cutoff 200 ng/mL 1000 Methadone, urine                Cutoff 300 ng/mL 1100 1200 The urine drug screen provides only a preliminary, unconfirmed 1300 analytical test result and should not be used for non-medical 1400 purposes. Clinical consideration and professional judgment should 1500 be applied to any positive drug screen result due to possible 1600 interfering substances. A more specific alternate chemical method 1700 must be used in order to obtain a confirmed analytical result.  1800 Gas chromato graphy / mass spectrometry (GC/MS) is the preferred 1900 confirmatory method.   T4, free     Status: None   Collection Time: 12/04/14  3:43 PM  Result Value Ref Range   Free T4 0.99 0.61 - 1.12 ng/dL    Vitals: Blood pressure 164/90, pulse 102, temperature 97.6 F (36.4 C), temperature source Oral, resp.  rate 18, height 6' 2"  (1.88 m), weight 68.04 kg (150 lb), SpO2 98 %.  Risk to Self: Suicidal Ideation: Yes-Currently Present Suicidal Intent: Yes-Currently Present Is patient at risk for suicide?: Yes Suicidal Plan?: Yes-Currently Present Specify Current Suicidal Plan: overdose pf paroxetine Access to Means: Yes Specify Access to Suicidal Means: medications What has been your use of drugs/alcohol within the last 12 months?: none How many times?: 1 Other Self Harm Risks: living alone; wife died 2011/08/21 Triggers for Past Attempts: Other personal contacts Intentional Self Injurious Behavior: None Risk to Others: Homicidal Ideation: No Thoughts of Harm to Others: No Current Homicidal Intent: No Current Homicidal Plan: No Access to Homicidal Means: No Identified Victim: none History of harm to others?: No Assessment of Violence: On admission Violent Behavior Description: none Does patient have access to weapons?: No Criminal Charges Pending?: No Does patient have a court date: No Prior Inpatient Therapy: Prior Inpatient Therapy: Yes Prior Therapy Dates: 4 years ago Prior Therapy Facilty/Provider(s): [facility] Reason for Treatment: Depression Prior Outpatient Therapy: Prior Outpatient Therapy: Yes Prior Therapy Dates: unknown Prior Therapy Facilty/Provider(s): Dr. Nicolasa Ducking Reason for Treatment: Depression Does patient have an ACCT team?: No Does patient have Intensive In-House Services?  : No Does patient have [facility] services? : No Does patient have P4[facility] services?: No  Current Facility-Administered Medications  Medication Dose Route Frequency Provider Last Rate Last Dose  . ALPRAZolam Duanne Moron) tablet 0.25 mg  0.25 mg Oral BID Gonzella Lex, MD      . Derrill Memo ON 12/05/2014] levothyroxine (SYNTHROID, LEVOTHROID) tablet 75 mcg  75 mcg Oral QAC breakfast Gonzella Lex, MD      . lisinopril (PRINIVIL,ZESTRIL) tablet 2.5 mg  2.5 mg Oral Daily John T Clapacs, MD      . pantoprazole (PROTONIX) EC  tablet 40 mg  40 mg Oral Daily Gonzella Lex, MD       Current Outpatient Prescriptions  Medication Sig Dispense Refill  . ALPRAZolam (XANAX) 0.5 MG tablet Take 0.5 mg by mouth 2 (two) times daily.    . baclofen (LIORESAL) 10 MG tablet Take 10 mg by mouth 3 (three) times daily.    . lansoprazole (PREVACID) 30 MG capsule Take 30 mg by mouth daily at 12 noon.    [name] levothyroxine (SYNTHROID, LEVOTHROID) 75 MCG tablet Take 75 mcg by mouth daily before breakfast.    . lisinopril (PRINIVIL,ZESTRIL) 2.5 MG  tablet Take 2.5 mg by mouth daily.    . Multiple Vitamins-Minerals (MULTIVITAMIN WITH MINERALS) tablet Take 1 tablet by mouth daily.    [name] PARoxetine (PAXIL) 20 MG tablet Take 20 mg by mouth daily.      Musculoskeletal: Strength & Muscle Tone: decreased Gait & Station: unsteady Patient leans: N/A  Psychiatric Specialty Exam: Physical Exam  Constitutional: He appears well-developed.  HENT:  Head: Normocephalic and atraumatic.  Eyes: Conjunctivae are normal. Pupils are equal, round, and reactive to light.  Neck: Normal range of motion.  Cardiovascular: Normal heart sounds.   Respiratory: Effort normal.  GI: Soft.  Musculoskeletal: Normal range of motion.  Neurological: He is alert.  Skin: Skin is warm and dry.     Psychiatric: His speech is delayed and slurred. He is slowed and withdrawn. Cognition and memory are impaired. He expresses impulsivity. He exhibits a depressed mood. He expresses suicidal ideation.  Patient is currently somewhat sedated speech little bit slurred but he is still able to give a history. Still able to describe suicidal ideation    Review of Systems  Constitutional: Positive for weight loss and malaise/fatigue.  HENT: Negative.   Eyes: Negative.   Respiratory: Negative.   Cardiovascular: Negative.   Gastrointestinal: Negative.   Musculoskeletal: Negative.   Skin: Negative.   Neurological: Positive for weakness.  Psychiatric/Behavioral: Positive for  depression and suicidal ideas. Negative for hallucinations and substance abuse. The patient is nervous/anxious and has insomnia.     Blood pressure 164/90, pulse 102, temperature 97.6 F (36.4 C), temperature source Oral, resp. rate 18, height 6' 2"  (1.88 m), weight 68.04 kg (150 lb), SpO2 98 %.Body mass index is 19.25 kg/(m^2).  General Appearance: Disheveled  Eye Contact::  Minimal  Speech:  Garbled and Slow  Volume:  Decreased  Mood:  Depressed  Affect:  Depressed  Thought Process:  Tangential  Orientation:  Full (Time, Place, and Person)  Thought Content:  Negative  Suicidal Thoughts:  Yes.  with intent/plan  Homicidal Thoughts:  No  Memory:  Immediate;   Good Recent;   Good Remote;   Good  Judgement:  Impaired  Insight:  Fair  Psychomotor Activity:  Decreased  Concentration:  Poor  Recall:  Poor  Fund of Knowledge:Poor  Language: Poor  Akathisia:  No  Handed:  Right  AIMS (if indicated):     Assets:  Desire for Improvement Housing Social Support  ADL's:  Impaired  Cognition: Impaired,  Mild  Sleep:      Medical Decision Making: Review of Psycho-Social Stressors (1), Review or order clinical lab tests (1), Discuss test with performing physician (1), Established Problem, Worsening (2), Review of Last Therapy Session (1) and Review of Medication Regimen & Side Effects (2)  Treatment Plan Summary: Medication management and Plan Patient status post serious suicide attempt. Multiple severe symptoms of depression. Doesn't appear to be obviously psychotic. Patient is elderly and appears to have lost a lot of weight. Currently he is on monitoring because of the overdose. No sign in my examination of serotonin syndrome. Patient clearly requires inpatient hospital treatment because of suicidality and depression. He will need referral to a geriatric psychiatry hospital. Discontinue Paxil for now. Continue other medicines as prescribed. Follow up in the emergency room and work on  referral to another facility. If it's going to take more than a day will start some antidepressive tomorrow. Case discussed with emergency room doctor. Continue commitment.  Plan:  Recommend psychiatric Inpatient admission when medically cleared. Supportive  therapy provided about ongoing stressors. Disposition: Admit to geriatric psychiatry unit as soon as available. Continue current medicine. Monitor.  John Clapacs 12/04/2014 7:04 PM

## 2014-12-04 NOTE — BH Specialist Note (Signed)
Client information has been faxed to the following facilities for inpatient consideration: [facility]; [facility]; [facility]; [facility]; [facility]; and [facility]

## 2014-12-04 NOTE — ED Notes (Signed)
Pt resting in bed, respirations even and unlabored, vitals stable

## 2014-12-04 NOTE — ED Notes (Signed)
EKG preformed, Dr. Bary Richard viewed EKG and no orders received, pt in no distress, lying in bed quietly, respirations even and unlabored

## 2014-12-05 ENCOUNTER — Inpatient Hospital Stay
Admit: 2014-12-05 | Discharge: 2014-12-05 | Disposition: A | Discharge status: Home / Self Care | Payer: Medicare PPO | Attending: Internal Medicine | Admitting: Internal Medicine

## 2014-12-05 ENCOUNTER — Encounter: Payer: Self-pay | Admitting: Internal Medicine

## 2014-12-05 DIAGNOSIS — E039 Hypothyroidism, unspecified: Secondary | ICD-10-CM | POA: Diagnosis present

## 2014-12-05 DIAGNOSIS — I499 Cardiac arrhythmia, unspecified: Secondary | ICD-10-CM | POA: Diagnosis present

## 2014-12-05 DIAGNOSIS — F329 Major depressive disorder, single episode, unspecified: Secondary | ICD-10-CM | POA: Diagnosis present

## 2014-12-05 DIAGNOSIS — Z87891 Personal history of nicotine dependence: Secondary | ICD-10-CM | POA: Diagnosis not present

## 2014-12-05 DIAGNOSIS — E876 Hypokalemia: Secondary | ICD-10-CM | POA: Diagnosis present

## 2014-12-05 DIAGNOSIS — E785 Hyperlipidemia, unspecified: Secondary | ICD-10-CM | POA: Diagnosis present

## 2014-12-05 DIAGNOSIS — I10 Essential (primary) hypertension: Secondary | ICD-10-CM | POA: Diagnosis present

## 2014-12-05 DIAGNOSIS — I2510 Atherosclerotic heart disease of native coronary artery without angina pectoris: Secondary | ICD-10-CM | POA: Diagnosis present

## 2014-12-05 DIAGNOSIS — T50902A Poisoning by unspecified drugs, medicaments and biological substances, intentional self-harm, initial encounter: Secondary | ICD-10-CM | POA: Diagnosis present

## 2014-12-05 DIAGNOSIS — K219 Gastro-esophageal reflux disease without esophagitis: Secondary | ICD-10-CM | POA: Diagnosis present

## 2014-12-05 DIAGNOSIS — Z7982 Long term (current) use of aspirin: Secondary | ICD-10-CM | POA: Diagnosis not present

## 2014-12-05 DIAGNOSIS — Z955 Presence of coronary angioplasty implant and graft: Secondary | ICD-10-CM | POA: Diagnosis not present

## 2014-12-05 DIAGNOSIS — Z96653 Presence of artificial knee joint, bilateral: Secondary | ICD-10-CM | POA: Diagnosis present

## 2014-12-05 DIAGNOSIS — F32A Depression, unspecified: Secondary | ICD-10-CM | POA: Diagnosis present

## 2014-12-05 DIAGNOSIS — I472 Ventricular tachycardia: Secondary | ICD-10-CM | POA: Diagnosis present

## 2014-12-05 DIAGNOSIS — T43222A Poisoning by selective serotonin reuptake inhibitors, intentional self-harm, initial encounter: Secondary | ICD-10-CM | POA: Diagnosis present

## 2014-12-05 DIAGNOSIS — R45851 Suicidal ideations: Secondary | ICD-10-CM | POA: Diagnosis present

## 2014-12-05 DIAGNOSIS — M069 Rheumatoid arthritis, unspecified: Secondary | ICD-10-CM | POA: Diagnosis present

## 2014-12-05 DIAGNOSIS — F332 Major depressive disorder, recurrent severe without psychotic features: Secondary | ICD-10-CM | POA: Diagnosis not present

## 2014-12-05 LAB — GLUCOSE, CAPILLARY: GLUCOSE-CAPILLARY: 93 mg/dL (ref 65–99)

## 2014-12-05 LAB — CBC
HEMATOCRIT: 40.8 % (ref 40.0–52.0)
HEMOGLOBIN: 13.2 g/dL (ref 13.0–18.0)
MCH: 29.9 pg (ref 26.0–34.0)
MCHC: 32.3 g/dL (ref 32.0–36.0)
MCV: 92.6 fL (ref 80.0–100.0)
Platelets: 178 10*3/uL (ref 150–440)
RBC: 4.4 MIL/uL (ref 4.40–5.90)
RDW: 15.6 % — ABNORMAL HIGH (ref 11.5–14.5)
WBC: 6.6 10*3/uL (ref 3.8–10.6)

## 2014-12-05 LAB — BASIC METABOLIC PANEL
Anion gap: 12 (ref 5–15)
BUN: 7 mg/dL (ref 6–20)
CO2: 31 mmol/L (ref 22–32)
Calcium: 9.6 mg/dL (ref 8.9–10.3)
Chloride: 100 mmol/L — ABNORMAL LOW (ref 101–111)
Creatinine, Ser: 1.03 mg/dL (ref 0.61–1.24)
GFR calc Af Amer: >60 mL/min (ref >60)
GLUCOSE: 109 mg/dL — AB (ref 65–99)
Potassium: 3 mmol/L — ABNORMAL LOW (ref 3.5–5.1)
SODIUM: 143 mmol/L (ref 135–145)

## 2014-12-05 LAB — TROPONIN I
TROPONIN I: 0.03 ng/mL (ref <0.031)
TROPONIN I: 0.03 ng/mL (ref <0.031)

## 2014-12-05 LAB — MRSA PCR SCREENING: MRSA by PCR: NEGATIVE

## 2014-12-05 LAB — MAGNESIUM: MAGNESIUM: 2 mg/dL (ref 1.7–2.4)

## 2014-12-05 MED ORDER — POTASSIUM CHLORIDE CRYS ER 20 MEQ PO TBCR
20.0000 meq | EXTENDED_RELEASE_TABLET | Freq: Two times a day (BID) | ORAL | Status: DC
Start: 2014-12-05 — End: 2014-12-06
  Administered 2014-12-05 – 2014-12-06 (×3): 20 meq via ORAL
  Filled 2014-12-05 (×3): qty 1

## 2014-12-05 MED ORDER — ENOXAPARIN SODIUM 40 MG/0.4ML ~~LOC~~ SOLN
40.0000 mg | SUBCUTANEOUS | Status: DC
  Administered 2014-12-05: 40 mg via SUBCUTANEOUS
  Filled 2014-12-05: qty 0.4

## 2014-12-05 MED ORDER — SODIUM CHLORIDE 0.9 % IJ SOLN: 3.0000 mL | INTRAMUSCULAR | Status: DC | PRN

## 2014-12-05 MED ORDER — AMIODARONE HCL 200 MG PO TABS
200.0000 mg | ORAL_TABLET | Freq: Every day | ORAL | Status: DC
Start: 2014-12-05 — End: 2014-12-06
  Administered 2014-12-05 – 2014-12-06 (×3): 200 mg via ORAL
  Filled 2014-12-05 (×3): qty 1

## 2014-12-05 MED ORDER — ACETAMINOPHEN 325 MG PO TABS
650.0000 mg | ORAL_TABLET | Freq: Four times a day (QID) | ORAL | Status: DC | PRN
Start: 2014-12-05 — End: 2014-12-06

## 2014-12-05 MED ORDER — ACETAMINOPHEN 650 MG RE SUPP: 650.0000 mg | Freq: Four times a day (QID) | RECTAL | Status: DC | PRN

## 2014-12-05 MED ORDER — SODIUM CHLORIDE 0.9 % IJ SOLN
3.0000 mL | Freq: Two times a day (BID) | INTRAMUSCULAR | Status: DC
  Administered 2014-12-05: 3 mL via INTRAVENOUS

## 2014-12-05 MED ORDER — ASPIRIN EC 81 MG PO TBEC
81.0000 mg | DELAYED_RELEASE_TABLET | Freq: Every day | ORAL | Status: DC
  Administered 2014-12-05 – 2014-12-06 (×2): 81 mg via ORAL
  Filled 2014-12-05 (×2): qty 1

## 2014-12-05 MED ORDER — LISINOPRIL 5 MG PO TABS
2.5000 mg | ORAL_TABLET | Freq: Every day | ORAL | Status: DC
  Administered 2014-12-05 – 2014-12-06 (×2): 2.5 mg via ORAL
  Filled 2014-12-05 (×2): qty 1

## 2014-12-05 MED ORDER — ALPRAZOLAM 0.5 MG PO TABS
0.5000 mg | ORAL_TABLET | Freq: Two times a day (BID) | ORAL | Status: DC
  Administered 2014-12-05 – 2014-12-06 (×3): 0.5 mg via ORAL
  Filled 2014-12-05 (×3): qty 1

## 2014-12-05 MED ORDER — POTASSIUM CHLORIDE CRYS ER 20 MEQ PO TBCR
EXTENDED_RELEASE_TABLET | ORAL | Status: AC
  Filled 2014-12-05: qty 2

## 2014-12-05 MED ORDER — LEVOTHYROXINE SODIUM 75 MCG PO TABS: 75.0000 ug | ORAL_TABLET | Freq: Every day | ORAL | Status: DC

## 2014-12-05 NOTE — Progress Notes (Signed)
[facility] - [place] at [facility]   PATIENT NAME: Matthew Foley    MR#:  [id number]  DATE OF BIRTH:  [birth year]  SUBJECTIVE:  CHIEF COMPLAINT:   Chief Complaint  Patient presents with  . Drug Overdose    No complain now.  REVIEW OF SYSTEMS:  CONSTITUTIONAL: No fever, fatigue or weakness.  EYES: No blurred or double vision.  EARS, NOSE, AND THROAT: No tinnitus or ear pain.  RESPIRATORY: No cough, shortness of breath, wheezing or hemoptysis.  CARDIOVASCULAR: No chest pain, orthopnea, edema.  GASTROINTESTINAL: No nausea, vomiting, diarrhea or abdominal pain.  GENITOURINARY: No dysuria, hematuria.  ENDOCRINE: No polyuria, nocturia,  HEMATOLOGY: No anemia, easy bruising or bleeding SKIN: No rash or lesion. MUSCULOSKELETAL: No joint pain or arthritis.   NEUROLOGIC: No tingling, numbness, weakness.  PSYCHIATRY: is depressed . [name]" now he feels stupid for doing this"  ROS  DRUG ALLERGIES:  Not on File  VITALS:  Blood pressure 131/84, pulse 59, temperature 98.3 F (36.8 C), temperature source Oral, resp. rate 20, height 6\' 2"  (1.88 m), weight 67.1 kg (147 lb 14.9 oz), SpO2 99 %.  PHYSICAL EXAMINATION:  GENERAL:  81 y.o.-year-old patient lying in the bed with no acute distress.  EYES: Pupils equal, round, reactive to light and accommodation. No scleral icterus. Extraocular muscles intact.  HEENT: Head atraumatic, normocephalic. Oropharynx and nasopharynx clear.  NECK:  Supple, no jugular venous distention. No thyroid enlargement, no tenderness.  LUNGS: Normal breath sounds bilaterally, no wheezing, rales,rhonchi or crepitation. No use of accessory muscles of respiration.  CARDIOVASCULAR: S1, S2 normal. No murmurs, rubs, or gallops.  ABDOMEN: Soft, nontender, nondistended. Bowel sounds present. No organomegaly or mass.  EXTREMITIES: No pedal edema, cyanosis, or clubbing.  NEUROLOGIC: Cranial nerves II through XII are intact. Muscle strength 5/5 in all  extremities. Sensation intact. Gait not checked.  PSYCHIATRIC: The patient is alert and oriented x 3.  SKIN: No obvious rash, lesion, or ulcer.   Physical Exam LABORATORY PANEL:   CBC  Recent Labs Lab 12/05/14 0747  WBC 6.6  HGB 13.2  HCT 40.8  PLT 178   ------------------------------------------------------------------------------------------------------------------  Chemistries   Recent Labs Lab 12/04/14 1543 12/05/14 0747  NA 142 143  K 3.0* 3.0*  CL 104 100*  CO2 27 31  GLUCOSE 84 109*  BUN 11 7  CREATININE 1.15 1.03  CALCIUM 8.5* 9.6  MG 1.5* 2.0  AST 26  --   ALT 11*  --   ALKPHOS 62  --   BILITOT 0.3  --    ------------------------------------------------------------------------------------------------------------------  Cardiac Enzymes  Recent Labs Lab 12/05/14 0239 12/05/14 0747  TROPONINI 0.03 0.03   ------------------------------------------------------------------------------------------------------------------  RADIOLOGY:  No results found.  ASSESSMENT AND PLAN:   1. Intentional drug overdosage with the 29 pills of Paxil 20 mg each with suicidal ideation, status post psychiatric evaluation advised inpatient psychiatric admission once medically stable.    Will keep on medical services today for hypokalemia and incidence of arrhythmia. 2. Arrhythmia with intermittent episodes of V. tach on cardiac monitoring, patient asymptomatic, vital signs stable. Started on oral amiodarone by cardiologist advice. Will monitor on telemtry tonight. 3. Hypertension, stable on home medications. 4. History of coronary artery disease, stable. No chest pain, troponin 2 WNL. negative troponins, appreciated cardiology recommendation for further advice. 5. Depression, worsening. Evaluated by psych in ED. Follow-up psychiatric Recommendations. 6. Hypothyroidism, stable on Synthroid. Continue same. 7. Hypokalemia- replaced.  Will monitor on tele tonight- likely  transfer to  [facility] tomorrow.  All the records are reviewed and case discussed with Care Management/Social Workerr. Management plans discussed with the patient, family and they are in agreement.  CODE STATUS: full  TOTAL TIME TAKING CARE OF THIS PATIENT: 35 minutes.   More than 50% of the visit was spent in counseling/coordination of care  POSSIBLE D/C IN 1-2 DAYS, DEPENDING ON CLINICAL CONDITION.   Altamese Dilling M.D on 12/05/2014   Between 7am to 6pm - Pager - [phone]  After 6pm go to [email or web address] - password [email or web address] [facility]  [place] [facility]  Office  [phone]  CC: Primary care physician; Jaclyn Shaggy, MD

## 2014-12-05 NOTE — Progress Notes (Signed)
   12/05/14 0932  Clinical Encounter Type  Visited With Patient;Health care provider  Visit Type Initial  Referral From Nurse  Consult/Referral To Chaplain  Spiritual Encounters  Spiritual Needs Emotional;Grief support  Stress Factors  Patient Stress Factors Lack of caregivers;Loss  Chaplain received an order to visit with patient. Spoke with patient regarding feelings and grief associated with death of  of wife of 60 yrs that occurred 69yrs ago. Provided a listening and compassionate presence. Chaplain Bryan Omura A. Pepper Kerrick Ext. [phone]

## 2014-12-05 NOTE — Progress Notes (Signed)
Matthew Foley is a 81 y.o. male  [id number]  Primary Cardiologist: Adrian Blackwater Reason for Consultation: Ventricular tachycardia  HPI: This is 81 year old white male with a past medical history of depression who took 29 pills of Paxil about 20 mg and was brought to the emergency room which vary EKG showed sinus tachycardia but the QTC was 523. Patient was given 4 g of magnesium IV. But couldn't continue to have ventricular tachycardia. Patient right now is feeling much better EKG shows some sinus tachycardia with some ST depression inferolaterally. His past medical history is history of depression hypertension hyperli   Past Medical History  Diagnosis Date  . Depression   . Hypertension   . Hyperlipidemia   . CAD (coronary artery disease)     at least 1 stent  . Hx of pancreatitis   . Rheumatoid arthritis   . Hypothyroidism   . GERD (gastroesophageal reflux disease)     Medications Prior to Admission  Medication Sig Dispense Refill  . ALPRAZolam (XANAX) 0.5 MG tablet Take 0.5 mg by mouth 2 (two) times daily.    . baclofen (LIORESAL) 10 MG tablet Take 10 mg by mouth 3 (three) times daily.    . lansoprazole (PREVACID) 30 MG capsule Take 30 mg by mouth daily at 12 noon.    [name] levothyroxine (SYNTHROID, LEVOTHROID) 75 MCG tablet Take 75 mcg by mouth daily before breakfast.    . lisinopril (PRINIVIL,ZESTRIL) 2.5 MG tablet Take 2.5 mg by mouth daily.    . Multiple Vitamins-Minerals (MULTIVITAMIN WITH MINERALS) tablet Take 1 tablet by mouth daily.    [name] PARoxetine (PAXIL) 20 MG tablet Take 20 mg by mouth daily.       [name] ALPRAZolam  0.25 mg Oral BID  . ALPRAZolam  0.5 mg Oral BID  . amiodarone  200 mg Oral Daily  . aspirin EC  81 mg Oral Daily  . enoxaparin (LOVENOX) injection  40 mg Subcutaneous Q24H  . levothyroxine  75 mcg Oral QAC breakfast  . lisinopril  2.5 mg Oral Daily  . pantoprazole  40 mg Oral Daily  . sodium chloride  3 mL Intravenous Q12H    Infusions:     Not on File  History   Social History  . Marital Status: Widowed    Spouse Name: N/A  . Number of Children: N/A  . Years of Education: N/A   Occupational History  . Not on file.   Social History Main Topics  . Smoking status: Former Games developer  . Smokeless tobacco: Former Neurosurgeon  . Alcohol Use: Yes  . Drug Use: No  . Sexual Activity: Not on file   Other Topics Concern  . Not on file   Social History Narrative    Family History  Problem Relation Age of Onset  . Hypertension Mother   . Hypertension Father   . CAD Father     PHYSICAL EXAM: Filed Vitals:   12/05/14 0800  BP: 133/79  Pulse: 87  Temp:   Resp: 26     Intake/Output Summary (Last 24 hours) at 12/05/14 0833 Last data filed at 12/05/14 0304  Gross per 24 hour  Intake      0 ml  Output    675 ml  Net   -675 ml    General:  Well appearing. No respiratory difficulty HEENT: normal Neck: supple. no JVD. Carotids 2+ bilat; no bruits. No lymphadenopathy or thryomegaly appreciated. Cor: PMI nondisplaced. Regular rate & rhythm. No rubs, gallops  or murmurs. Lungs: clear Abdomen: soft, nontender, nondistended. No hepatosplenomegaly. No bruits or masses. Good bowel sounds. Extremities: no cyanosis, clubbing, rash, edema Neuro: alert & oriented x 3, cranial nerves grossly intact. moves all 4 extremities w/o difficulty. Affect pleasant.  ECG: ECG so shows sinus tachycardia with nonspecific ST-T changes  Results for orders placed or performed during the hospital encounter of 12/04/14 (from the past 24 hour(s))  Acetaminophen level     Status: Abnormal   Collection Time: 12/04/14  3:43 PM  Result Value Ref Range   Acetaminophen (Tylenol), Serum <10 (L) 10 - 30 ug/mL  Comprehensive metabolic panel     Status: Abnormal   Collection Time: 12/04/14  3:43 PM  Result Value Ref Range   Sodium 142 135 - 145 mmol/L   Potassium 3.0 (L) 3.5 - 5.1 mmol/L   Chloride 104 101 - 111 mmol/L   CO2 27 22 - 32 mmol/L    Glucose, Bld 84 65 - 99 mg/dL   BUN 11 6 - 20 mg/dL   Creatinine, Ser 1.76 0.61 - 1.24 mg/dL   Calcium 8.5 (L) 8.9 - 10.3 mg/dL   Total Protein 6.2 (L) 6.5 - 8.1 g/dL   Albumin 3.1 (L) 3.5 - 5.0 g/dL   AST 26 15 - 41 U/L   ALT 11 (L) 17 - 63 U/L   Alkaline Phosphatase 62 38 - 126 U/L   Total Bilirubin 0.3 0.3 - 1.2 mg/dL   GFR calc non Af Amer 59 (L) >60 mL/min   GFR calc Af Amer >60 >60 mL/min   Anion gap 11 5 - 15  Ethanol (ETOH)     Status: Abnormal   Collection Time: 12/04/14  3:43 PM  Result Value Ref Range   Alcohol, Ethyl (B) 22 (H) <5 mg/dL  Salicylate level     Status: None   Collection Time: 12/04/14  3:43 PM  Result Value Ref Range   Salicylate Lvl <4.0 2.8 - 30.0 mg/dL  CBC with Differential/Platelet     Status: Abnormal   Collection Time: 12/04/14  3:43 PM  Result Value Ref Range   WBC 3.1 (L) 3.8 - 10.6 K/uL   RBC 3.67 (L) 4.40 - 5.90 MIL/uL   Hemoglobin 11.0 (L) 13.0 - 18.0 g/dL   HCT 16.0 (L) 73.7 - 10.6 %   MCV 92.9 80.0 - 100.0 fL   MCH 29.9 26.0 - 34.0 pg   MCHC 32.2 32.0 - 36.0 g/dL   RDW 26.9 (H) 48.5 - 46.2 %   Platelets 135 (L) 150 - 440 K/uL   Neutrophils Relative % 47 %   Neutro Abs 1.5 1.4 - 6.5 K/uL   Lymphocytes Relative 41 %   Lymphs Abs 1.3 1.0 - 3.6 K/uL   Monocytes Relative 10 %   Monocytes Absolute 0.3 0.2 - 1.0 K/uL   Eosinophils Relative 1 %   Eosinophils Absolute 0.0 0 - 0.7 K/uL   Basophils Relative 1 %   Basophils Absolute 0.0 0 - 0.1 K/uL  Magnesium     Status: Abnormal   Collection Time: 12/04/14  3:43 PM  Result Value Ref Range   Magnesium 1.5 (L) 1.7 - 2.4 mg/dL  Urinalysis complete, with microscopic ([facility] only)     Status: Abnormal   Collection Time: 12/04/14  3:43 PM  Result Value Ref Range   Color, Urine YELLOW (A) YELLOW   APPearance CLEAR (A) CLEAR   Glucose, UA NEGATIVE NEGATIVE mg/dL   Bilirubin Urine NEGATIVE NEGATIVE  Ketones, ur NEGATIVE NEGATIVE mg/dL   Specific Gravity, Urine 1.004 (L) 1.005 - 1.030   Hgb  urine dipstick 1+ (A) NEGATIVE   pH 7.0 5.0 - 8.0   Protein, ur NEGATIVE NEGATIVE mg/dL   Nitrite NEGATIVE NEGATIVE   Leukocytes, UA NEGATIVE NEGATIVE   RBC / HPF 0-5 0 - 5 RBC/hpf   WBC, UA NONE SEEN 0 - 5 WBC/hpf   Bacteria, UA NONE SEEN NONE SEEN   Squamous Epithelial / LPF NONE SEEN NONE SEEN  Urine Drug Screen, Qualitative ([facility] only)     Status: Abnormal   Collection Time: 12/04/14  3:43 PM  Result Value Ref Range   Tricyclic, Ur Screen NONE DETECTED NONE DETECTED   Amphetamines, Ur Screen NONE DETECTED NONE DETECTED   MDMA (Ecstasy)Ur Screen NONE DETECTED NONE DETECTED   Cocaine Metabolite,Ur [place] NONE DETECTED NONE DETECTED   Opiate, Ur Screen NONE DETECTED NONE DETECTED   Phencyclidine (PCP) Ur S NONE DETECTED NONE DETECTED   Cannabinoid 50 Ng, Ur  NONE DETECTED NONE DETECTED   Barbiturates, Ur Screen NONE DETECTED NONE DETECTED   Benzodiazepine, Ur Scrn POSITIVE (A) NONE DETECTED   Methadone Scn, Ur NONE DETECTED NONE DETECTED  T4, free     Status: None   Collection Time: 12/04/14  3:43 PM  Result Value Ref Range   Free T4 0.99 0.61 - 1.12 ng/dL  Troponin I     Status: Abnormal   Collection Time: 12/04/14 11:14 PM  Result Value Ref Range   Troponin I 0.04 (H) <0.031 ng/mL  Troponin I     Status: None   Collection Time: 12/05/14  2:39 AM  Result Value Ref Range   Troponin I 0.03 <0.031 ng/mL  Glucose, capillary     Status: None   Collection Time: 12/05/14  6:11 AM  Result Value Ref Range   Glucose-Capillary 93 65 - 99 mg/dL  MRSA PCR Screening     Status: None   Collection Time: 12/05/14  6:30 AM  Result Value Ref Range   MRSA by PCR NEGATIVE NEGATIVE  Basic metabolic panel     Status: Abnormal   Collection Time: 12/05/14  7:47 AM  Result Value Ref Range   Sodium 143 135 - 145 mmol/L   Potassium 3.0 (L) 3.5 - 5.1 mmol/L   Chloride 100 (L) 101 - 111 mmol/L   CO2 31 22 - 32 mmol/L   Glucose, Bld 109 (H) 65 - 99 mg/dL   BUN 7 6 - 20 mg/dL   Creatinine, Ser  5.10 0.61 - 1.24 mg/dL   Calcium 9.6 8.9 - 25.8 mg/dL   GFR calc non Af Amer >60 >60 mL/min   GFR calc Af Amer >60 >60 mL/min   Anion gap 12 5 - 15  CBC     Status: Abnormal   Collection Time: 12/05/14  7:47 AM  Result Value Ref Range   WBC 6.6 3.8 - 10.6 K/uL   RBC 4.40 4.40 - 5.90 MIL/uL   Hemoglobin 13.2 13.0 - 18.0 g/dL   HCT 52.7 78.2 - 42.3 %   MCV 92.6 80.0 - 100.0 fL   MCH 29.9 26.0 - 34.0 pg   MCHC 32.3 32.0 - 36.0 g/dL   RDW 53.6 (H) 14.4 - 31.5 %   Platelets 178 150 - 440 K/uL  Magnesium     Status: None   Collection Time: 12/05/14  7:47 AM  Result Value Ref Range   Magnesium 2.0 1.7 - 2.4 mg/dL  Troponin  I     Status: None   Collection Time: 12/05/14  7:47 AM  Result Value Ref Range   Troponin I 0.03 <0.031 ng/mL   No results found.   ASSESSMENT AND PLAN: Prolonged QT interval secondary to overdose of Paxil with occasional complex ventricular arrhythmias. Currently patient got 4 g of magnesium appears to be stable with no acute changes on EKG. Advise doing cardiac enzymes 3 and getting an echocardiogram and will follow the patient closely with you thank you very much for referral.  Taniesha Glanz A

## 2014-12-05 NOTE — H&P (Signed)
[facility] [place] [facility]   PATIENT NAME: Matthew Foley    MR#:  [id number]  DATE OF BIRTH:  06/30/1932  DATE OF ADMISSION:  12/04/2014  PRIMARY CARE PHYSICIAN: Jaclyn Shaggy, MD   REQUESTING/REFERRING PHYSICIAN: Margaret Pyle  CHIEF COMPLAINT:   Chief Complaint  Patient presents with  . Drug Overdose   international drug overdosage Intermittent run of V. tach on cardiac monitoring  HISTORY OF PRESENT ILLNESS:  Matthew Foley  is a 81 y.o. male with below mentioned past medical history, ongoing depression following his wife's death 3 years ago with recent worsening brought into the ED with the complaints of interstitial drug OD with the 29 pills of Paxil 20 mg each with suicidal ideation. Patient was evaluated by the ED physician and was found to be alert awake and oriented on arrival, initial EKG showed normal sinus rhythm with ventricular rate of 74 bpm, bifascicular proximal and a QTC intervals 523. Patient had transient hypotension while in the ED for which he received IV fluids normal saline bolus and since then his blood pressure is stable and maintaining in normal range. Poison Control Center was contacted by the ED physician who recommended supportive treatment only at this time with close monitoring. Psychiatric on call was consulted by the ED physician who evaluated the patient and advised inpatient psychiatric admission once medically stable. Patient was being monitored in the ED and was completely asymptomatic but on cardiac monitor he was noted to have intermittent episodes of runs of V. tach lasting for a few seconds each time. Patient continued to be alert awake and oriented without any symptoms such as chest pain shortness of breath dizziness or palpitations. Cardiology on call was consulted by the ED physician who recommended to start amiodarone and advised further monitoring. Patient was given 200 mg of amiodarone by mouth and hospitalist  service was considered for further management.  PAST MEDICAL HISTORY:   Past Medical History  Diagnosis Date  . Depression   . Hypertension   . Hyperlipidemia   . CAD (coronary artery disease)     at least 1 stent  . Hx of pancreatitis   . Rheumatoid arthritis   . Hypothyroidism   . GERD (gastroesophageal reflux disease)     PAST SURGICAL HISTORY:   Past Surgical History  Procedure Laterality Date  . Knee replacement Bilateral   . Cholecystectomy    . Hernia repair    . Toe amputation      Right, 2nd toe  . Joint replacement      SOCIAL HISTORY:   History  Substance Use Topics  . Smoking status: Former Games developer  . Smokeless tobacco: Former Neurosurgeon  . Alcohol Use: Yes    FAMILY HISTORY:   Family History  Problem Relation Age of Onset  . Hypertension Mother   . Hypertension Father   . CAD Father     DRUG ALLERGIES:  Not on File  REVIEW OF SYSTEMS:   Review of Systems  Constitutional: Negative for fever, chills and malaise/fatigue.  HENT: Negative for ear pain, hearing loss, nosebleeds, sore throat and tinnitus.   Eyes: Negative for blurred vision, double vision, pain, discharge and redness.  Respiratory: Negative for cough, hemoptysis, sputum production, shortness of breath and wheezing.   Cardiovascular: Negative for chest pain, palpitations, orthopnea and leg swelling.  Gastrointestinal: Negative for nausea, vomiting, abdominal pain, diarrhea, constipation, blood in stool and melena.  Genitourinary: Negative for dysuria, urgency, frequency and hematuria.  Musculoskeletal:  Negative for back pain, joint pain and neck pain.  Skin: Negative for itching and rash.  Neurological: Negative for dizziness, tingling, sensory change, focal weakness and seizures.  Endo/Heme/Allergies: Does not bruise/bleed easily.  Psychiatric/Behavioral: Positive for depression and suicidal ideas. The patient is nervous/anxious and has insomnia.     MEDICATIONS AT HOME:   Prior to  Admission medications   Medication Sig Start Date End Date Taking? Authorizing Provider  ALPRAZolam Prudy Feeler) 0.5 MG tablet Take 0.5 mg by mouth 2 (two) times daily.   Yes Historical Provider, MD  baclofen (LIORESAL) 10 MG tablet Take 10 mg by mouth 3 (three) times daily.   Yes Historical Provider, MD  lansoprazole (PREVACID) 30 MG capsule Take 30 mg by mouth daily at 12 noon.   Yes Historical Provider, MD  levothyroxine (SYNTHROID, LEVOTHROID) 75 MCG tablet Take 75 mcg by mouth daily before breakfast.   Yes Historical Provider, MD  lisinopril (PRINIVIL,ZESTRIL) 2.5 MG tablet Take 2.5 mg by mouth daily.   Yes Historical Provider, MD  Multiple Vitamins-Minerals (MULTIVITAMIN WITH MINERALS) tablet Take 1 tablet by mouth daily.   Yes Historical Provider, MD  PARoxetine (PAXIL) 20 MG tablet Take 20 mg by mouth daily.   Yes Historical Provider, MD      VITAL SIGNS:  Blood pressure 122/83, pulse 95, temperature 97.6 F (36.4 C), temperature source Oral, resp. rate 17, height 6\' 2"  (1.88 m), weight 68.04 kg (150 lb), SpO2 97 %.  PHYSICAL EXAMINATION:  Physical Exam  Constitutional: He is oriented to person, place, and time. He appears well-developed and well-nourished. No distress.  HENT:  Head: Normocephalic and atraumatic.  Right Ear: External ear normal.  Left Ear: External ear normal.  Nose: Nose normal.  Mouth/Throat: Oropharynx is clear and moist. No oropharyngeal exudate.  Eyes: EOM are normal. Pupils are equal, round, and reactive to light. No scleral icterus.  Neck: Normal range of motion. Neck supple. No JVD present. No thyromegaly present.  Cardiovascular: Normal rate, regular rhythm, normal heart sounds and intact distal pulses.  Exam reveals no friction rub.   No murmur heard. Respiratory: Effort normal and breath sounds normal. No respiratory distress. He has no wheezes. He has no rales. He exhibits no tenderness.  GI: Soft. Bowel sounds are normal. He exhibits no distension and no  mass. There is no tenderness. There is no rebound and no guarding.  Musculoskeletal: Normal range of motion. He exhibits no edema.  Lymphadenopathy:    He has no cervical adenopathy.  Neurological: He is alert and oriented to person, place, and time. He has normal reflexes. He displays normal reflexes. No cranial nerve deficit. He exhibits normal muscle tone.  Skin: Skin is warm. No rash noted. No erythema.  Psychiatric:  Mode depressed   LABORATORY PANEL:   CBC  Recent Labs Lab 12/04/14 1543  WBC 3.1*  HGB 11.0*  HCT 34.1*  PLT 135*   ------------------------------------------------------------------------------------------------------------------  Chemistries   Recent Labs Lab 12/04/14 1543  NA 142  K 3.0*  CL 104  CO2 27  GLUCOSE 84  BUN 11  CREATININE 1.15  CALCIUM 8.5*  MG 1.5*  AST 26  ALT 11*  ALKPHOS 62  BILITOT 0.3   ------------------------------------------------------------------------------------------------------------------  Cardiac Enzymes  Recent Labs Lab 12/05/14 0239  TROPONINI 0.03   ------------------------------------------------------------------------------------------------------------------  RADIOLOGY:  No results found.  EKG:   Orders placed or performed during the hospital encounter of 12/04/14  . EKG 12-Lead 1. Normal sinus rhythm with ventricular rate of 74 bpm,  RBBB, left anterior fascicular block, QTC 523. Repeat QTC on next EKG 537. Second repeat QTC on thought EKG 555.   [name] EKG 12-Lead 2. Normal sinus rhythm with ventricular rate of 88 bpm, PAC's, bifascicular block. QTC 549   . ED EKG  . ED EKG  . ED EKG  . ED EKG    IMPRESSION AND PLAN:   1. Intentional drug overdosage with the 29 pills of Paxil 20 mg each with suicidal ideation, status post psychiatric evaluation advised inpatient psychiatric admission once medically stable.  2. Arrhythmia with intermittent episodes of heartburn of V. tach on cardiac monitoring,  patient asymptomatic, vital signs stable. Started on oral amiodarone by ED physician or cardiologist advice. Patient stable clinically. 3. Hypertension, stable on home medications. 4. History of coronary artery disease, stable. No chest pain, troponin 2 WNL. Follow-up troponins, cardiology recommendation for further advice. 5. Depression, worsening. Evaluated by psych in ED. Follow-up psychiatric Recommendations. 6. Hypothyroidism, stable on Synthroid. Continue same.  Plan: Admit to ICU for close monitoring, cardiac monitoring, continue amiodarone per cardiology advice. Cycle cardiac enzymes. Follow cardiology recommendations. Continue home medications except for Paxil which is on hold per psychiatry this time.    All the records are reviewed and case discussed with ED provider. Management plans discussed with the patient, family and they are in agreement.  CODE STATUS: Full code  TOTAL TIME TAKING CARE OF THIS PATIENT: 50 minutes.    Crissie Figures M.D on 12/05/2014 at 5:07 AM  Between 7am to 6pm - Pager - [phone]  After 6pm go to [email or web address] - password [email or web address] [facility]  [facility]  Office  [phone]  CC: Primary care physician; Jaclyn Shaggy, MD

## 2014-12-05 NOTE — Care Management Note (Signed)
Case Management Note  Patient Details  Name: Matthew Foley MRN: [id number] Date of Birth: 1932-07-12  Subjective/Objective:    Patient transferred to the floor. He lives at home with his daughter. Admitted with V tach secondary to an intentional overdose of approximately 30 Paxil. Plan is geriatric psychiatric unit once medically stable.                  Action/Plan:   Expected Discharge Date:                  Expected Discharge Plan:     In-House Referral:     Discharge planning Services     Post Acute Care Choice:    Choice offered to:     DME Arranged:    DME Agency:     HH Arranged:    HH Agency:     Status of Service:     Medicare Important Message Given:    Date Medicare IM Given:    Medicare IM give by:    Date Additional Medicare IM Given:    Additional Medicare Important Message give by:     If discussed at Long Length of Stay Meetings, dates discussed:    Additional Comments:  Marily Memos, RN 12/05/2014, 11:38 AM

## 2014-12-05 NOTE — Progress Notes (Signed)
*  PRELIMINARY RESULTS* Echocardiogram 2D Echocardiogram has been performed.  Matthew Foley 12/05/2014, 12:09 PM

## 2014-12-05 NOTE — ED Provider Notes (Signed)
-----------------------------------------   4:30 AM on 12/05/2014 -----------------------------------------  Assuming care from Dr. York Cerise.  In short, Matthew Foley is a 81 y.o. male with a chief complaint of Drug Overdose .  Refer to the original H&P for additional details.  The current plan of care is to monitor the patient and follow-up his blood work.  ED ECG REPORT#4 I, Rebecka Apley, the attending physician, personally viewed and interpreted this ECG.   Date: 12/05/2014  EKG Time: 142  Rate: 88  Rhythm: normal sinus rhythm, prolonged QT interval  Axis: Normal  Intervals:right bundle branch block and left anterior fascicular block, bifascicular block  ST&T Change: ST depression in lead V3 V4 and V5  The patient had normal blood pressure throughout but is continued to have prolonged QT and these episodes of ventricular tachycardia on the monitor. We did repeat the patient's troponin which was found to be 0.03 but at this time I feel that the patient needs to be admitted to the hospital and have his arrhythmia stabilized before being transferred to a psych facility. I did discuss this with Dr. Betti Cruz who accepted the patient to the hospitalist service.  I contacted the cardiologist Dr. Welton Flakes and asked for recommendations based on the patient's arrhythmia. He recommended either amiodarone or metoprolol so I did choose to give the patient amiodarone 200 mg by mouth 1 dose.    Rebecka Apley, MD 12/05/14 [phone]

## 2014-12-05 NOTE — Care Management (Signed)
Patient transferred to 2A from ICU.  He is currently under IVC for intentional overdose of Paxil.   It is documented discharge plan for for transfer to [facility] psych facility.  Referral to CSW

## 2014-12-05 NOTE — Consult Note (Signed)
[facility] Face-to-Face Psychiatry Consult   Reason for Consult:  81 year old man with a history of major depression admitted to the hospital after overdosing on antidepressives Referring Physician:  Reece Levy Patient Identification: Matthew Foley MRN:  [id number] Principal Diagnosis: Drug overdose, intentional Diagnosis:   Patient Active Problem List   Diagnosis Date Noted  . Drug overdose, intentional [T50.902A] 12/05/2014  . Arrhythmia, ventricular [I49.9] 12/05/2014  . CAD (coronary artery disease) [I25.10] 12/05/2014  . Depression [F32.9] 12/05/2014  . Major depression [F32.2] 12/04/2014  . Suicidal behavior [F48.9] 12/04/2014  . Hypothyroid [E03.9] 12/04/2014  . Hypertension [I10] 12/04/2014    Total Time spent with patient: 45 minutes  Subjective:   Matthew Foley is a 81 y.o. male patient admitted with patient overdosed on Paxil. Suicidal intent. Depressed. Now minimizing the problem.[name]  HPI:  See full note from the emergency room. This 81 year old man with a history of major depression overdosed on his Paxil was suicidal intent. Mood stays depressed. He is now minimizing his symptoms and intention but he's had suicide attempts in the past as well. Denies he's having any psychotic symptoms. Patient's wife died a few years ago and since then he has been severely depressed.  Past psychiatric history: History of depression with outpatient treatment by psychiatry in the past more recently by his primary care doctor. He had a upcoming appointment to see a psychiatrist at our office  Medical history: History of coronary artery disease hypothyroidism high blood pressure history of arrhythmia  Social history: Patient lives with his daughter. A friend of his daughter also either lives there were comes in frequently to help the patient out. He feels very isolated since his wife died 3 years ago. HPI Elements:   Quality:  Major depression and suicidal ideation. Severity:  Severe  life-threatening. Timing:  Bad and worse the last few days. Duration:  Ongoing for a couple years. Context:  Isolation and loneliness.  Past Medical History:  Past Medical History  Diagnosis Date  . Depression   . Hypertension   . Hyperlipidemia   . CAD (coronary artery disease)     at least 1 stent  . Hx of pancreatitis   . Rheumatoid arthritis   . Hypothyroidism   . GERD (gastroesophageal reflux disease)     Past Surgical History  Procedure Laterality Date  . Knee replacement Bilateral   . Cholecystectomy    . Hernia repair    . Toe amputation      Right, 2nd toe  . Joint replacement     Family History:  Family History  Problem Relation Age of Onset  . Hypertension Mother   . Hypertension Father   . CAD Father    Social History:  History  Alcohol Use  . Yes     History  Drug Use No    History   Social History  . Marital Status: Widowed    Spouse Name: N/A  . Number of Children: N/A  . Years of Education: N/A   Social History Main Topics  . Smoking status: Former Research scientist (life sciences)  . Smokeless tobacco: Former Systems developer  . Alcohol Use: Yes  . Drug Use: No  . Sexual Activity: Not on file   Other Topics Concern  . None   Social History Narrative   Additional Social History:                          Allergies:  Not on File  Labs:  Results for orders placed or performed during the hospital encounter of 12/04/14 (from the past 48 hour(s))  Acetaminophen level     Status: Abnormal   Collection Time: 12/04/14  3:43 PM  Result Value Ref Range   Acetaminophen (Tylenol), Serum <10 (L) 10 - 30 ug/mL    Comment:        THERAPEUTIC CONCENTRATIONS VARY SIGNIFICANTLY. A RANGE OF 10-30 ug/mL MAY BE AN EFFECTIVE CONCENTRATION FOR MANY PATIENTS. HOWEVER, SOME ARE BEST TREATED AT CONCENTRATIONS OUTSIDE THIS RANGE. ACETAMINOPHEN CONCENTRATIONS >150 ug/mL AT 4 HOURS AFTER INGESTION AND >50 ug/mL AT 12 HOURS AFTER INGESTION ARE OFTEN ASSOCIATED WITH  TOXIC REACTIONS.   Comprehensive metabolic panel     Status: Abnormal   Collection Time: 12/04/14  3:43 PM  Result Value Ref Range   Sodium 142 135 - 145 mmol/L   Potassium 3.0 (L) 3.5 - 5.1 mmol/L   Chloride 104 101 - 111 mmol/L   CO2 27 22 - 32 mmol/L   Glucose, Bld 84 65 - 99 mg/dL   BUN 11 6 - 20 mg/dL   Creatinine, Ser 1.15 0.61 - 1.24 mg/dL   Calcium 8.5 (L) 8.9 - 10.3 mg/dL   Total Protein 6.2 (L) 6.5 - 8.1 g/dL   Albumin 3.1 (L) 3.5 - 5.0 g/dL   AST 26 15 - 41 U/L   ALT 11 (L) 17 - 63 U/L   Alkaline Phosphatase 62 38 - 126 U/L   Total Bilirubin 0.3 0.3 - 1.2 mg/dL   GFR calc non Af Amer 59 (L) >60 mL/min   GFR calc Af Amer >60 >60 mL/min    Comment: (NOTE) The eGFR has been calculated using the CKD EPI equation. This calculation has not been validated in all clinical situations. eGFR's persistently <60 mL/min signify possible Chronic Kidney Disease.    Anion gap 11 5 - 15  Ethanol (ETOH)     Status: Abnormal   Collection Time: 12/04/14  3:43 PM  Result Value Ref Range   Alcohol, Ethyl (B) 22 (H) <5 mg/dL    Comment:        LOWEST DETECTABLE LIMIT FOR SERUM ALCOHOL IS 5 mg/dL FOR MEDICAL PURPOSES ONLY   Salicylate level     Status: None   Collection Time: 12/04/14  3:43 PM  Result Value Ref Range   Salicylate Lvl <5.4 2.8 - 30.0 mg/dL  CBC with Differential/Platelet     Status: Abnormal   Collection Time: 12/04/14  3:43 PM  Result Value Ref Range   WBC 3.1 (L) 3.8 - 10.6 K/uL   RBC 3.67 (L) 4.40 - 5.90 MIL/uL   Hemoglobin 11.0 (L) 13.0 - 18.0 g/dL   HCT 34.1 (L) 40.0 - 52.0 %   MCV 92.9 80.0 - 100.0 fL   MCH 29.9 26.0 - 34.0 pg   MCHC 32.2 32.0 - 36.0 g/dL   RDW 15.1 (H) 11.5 - 14.5 %   Platelets 135 (L) 150 - 440 K/uL   Neutrophils Relative % 47 %   Neutro Abs 1.5 1.4 - 6.5 K/uL   Lymphocytes Relative 41 %   Lymphs Abs 1.3 1.0 - 3.6 K/uL   Monocytes Relative 10 %   Monocytes Absolute 0.3 0.2 - 1.0 K/uL   Eosinophils Relative 1 %   Eosinophils  Absolute 0.0 0 - 0.7 K/uL   Basophils Relative 1 %   Basophils Absolute 0.0 0 - 0.1 K/uL  Magnesium     Status: Abnormal   Collection Time: 12/04/14  3:43 PM  Result Value Ref Range   Magnesium 1.5 (L) 1.7 - 2.4 mg/dL  Urinalysis complete, with microscopic ([facility] only)     Status: Abnormal   Collection Time: 12/04/14  3:43 PM  Result Value Ref Range   Color, Urine YELLOW (A) YELLOW   APPearance CLEAR (A) CLEAR   Glucose, UA NEGATIVE NEGATIVE mg/dL   Bilirubin Urine NEGATIVE NEGATIVE   Ketones, ur NEGATIVE NEGATIVE mg/dL   Specific Gravity, Urine 1.004 (L) 1.005 - 1.030   Hgb urine dipstick 1+ (A) NEGATIVE   pH 7.0 5.0 - 8.0   Protein, ur NEGATIVE NEGATIVE mg/dL   Nitrite NEGATIVE NEGATIVE   Leukocytes, UA NEGATIVE NEGATIVE   RBC / HPF 0-5 0 - 5 RBC/hpf   WBC, UA NONE SEEN 0 - 5 WBC/hpf   Bacteria, UA NONE SEEN NONE SEEN   Squamous Epithelial / LPF NONE SEEN NONE SEEN  Urine Drug Screen, Qualitative ([facility] only)     Status: Abnormal   Collection Time: 12/04/14  3:43 PM  Result Value Ref Range   Tricyclic, Ur Screen NONE DETECTED NONE DETECTED   Amphetamines, Ur Screen NONE DETECTED NONE DETECTED   MDMA (Ecstasy)Ur Screen NONE DETECTED NONE DETECTED   Cocaine Metabolite,Ur [place] NONE DETECTED NONE DETECTED   Opiate, Ur Screen NONE DETECTED NONE DETECTED   Phencyclidine (PCP) Ur S NONE DETECTED NONE DETECTED   Cannabinoid 50 Ng, Ur  NONE DETECTED NONE DETECTED   Barbiturates, Ur Screen NONE DETECTED NONE DETECTED   Benzodiazepine, Ur Scrn POSITIVE (A) NONE DETECTED   Methadone Scn, Ur NONE DETECTED NONE DETECTED    Comment: (NOTE) 115  Tricyclics, urine               Cutoff 1000 ng/mL 200  Amphetamines, urine             Cutoff 1000 ng/mL 300  MDMA (Ecstasy), urine           Cutoff 500 ng/mL 400  Cocaine Metabolite, urine       Cutoff 300 ng/mL 500  Opiate, urine                   Cutoff 300 ng/mL 600  Phencyclidine (PCP), urine      Cutoff 25 ng/mL 700  Cannabinoid, urine               Cutoff 50 ng/mL 800  Barbiturates, urine             Cutoff 200 ng/mL 900  Benzodiazepine, urine           Cutoff 200 ng/mL 1000 Methadone, urine                Cutoff 300 ng/mL 1100 1200 The urine drug screen provides only a preliminary, unconfirmed 1300 analytical test result and should not be used for non-medical 1400 purposes. Clinical consideration and professional judgment should 1500 be applied to any positive drug screen result due to possible 1600 interfering substances. A more specific alternate chemical method 1700 must be used in order to obtain a confirmed analytical result.  1800 Gas chromato graphy / mass spectrometry (GC/MS) is the preferred 1900 confirmatory method.   T4, free     Status: None   Collection Time: 12/04/14  3:43 PM  Result Value Ref Range   Free T4 0.99 0.61 - 1.12 ng/dL  Troponin I     Status: Abnormal   Collection Time: 12/04/14 11:14 PM  Result Value Ref Range  Troponin I 0.04 (H) <0.031 ng/mL    Comment: READ BACK AND VERIFIED BY LUIS FLORES @2355  12/04/14.Marland KitchenMarland KitchenAJO        PERSISTENTLY INCREASED TROPONIN VALUES IN THE RANGE OF 0.04-0.49 ng/mL CAN BE SEEN IN:       -UNSTABLE ANGINA       -CONGESTIVE HEART FAILURE       -MYOCARDITIS       -CHEST TRAUMA       -ARRYHTHMIAS       -LATE PRESENTING MYOCARDIAL INFARCTION       -COPD   CLINICAL FOLLOW-UP RECOMMENDED.   Troponin I     Status: None   Collection Time: 12/05/14  2:39 AM  Result Value Ref Range   Troponin I 0.03 <0.031 ng/mL    Comment:        NO INDICATION OF MYOCARDIAL INJURY.   Glucose, capillary     Status: None   Collection Time: 12/05/14  6:11 AM  Result Value Ref Range   Glucose-Capillary 93 65 - 99 mg/dL  MRSA PCR Screening     Status: None   Collection Time: 12/05/14  6:30 AM  Result Value Ref Range   MRSA by PCR NEGATIVE NEGATIVE    Comment:        The GeneXpert MRSA Assay (FDA approved for NASAL specimens only), is one component of a comprehensive MRSA  colonization surveillance program. It is not intended to diagnose MRSA infection nor to guide or monitor treatment for MRSA infections.   Basic metabolic panel     Status: Abnormal   Collection Time: 12/05/14  7:47 AM  Result Value Ref Range   Sodium 143 135 - 145 mmol/L   Potassium 3.0 (L) 3.5 - 5.1 mmol/L   Chloride 100 (L) 101 - 111 mmol/L   CO2 31 22 - 32 mmol/L   Glucose, Bld 109 (H) 65 - 99 mg/dL   BUN 7 6 - 20 mg/dL   Creatinine, Ser 1.03 0.61 - 1.24 mg/dL   Calcium 9.6 8.9 - 10.3 mg/dL   GFR calc non Af Amer >60 >60 mL/min   GFR calc Af Amer >60 >60 mL/min    Comment: (NOTE) The eGFR has been calculated using the CKD EPI equation. This calculation has not been validated in all clinical situations. eGFR's persistently <60 mL/min signify possible Chronic Kidney Disease.    Anion gap 12 5 - 15  CBC     Status: Abnormal   Collection Time: 12/05/14  7:47 AM  Result Value Ref Range   WBC 6.6 3.8 - 10.6 K/uL   RBC 4.40 4.40 - 5.90 MIL/uL   Hemoglobin 13.2 13.0 - 18.0 g/dL   HCT 40.8 40.0 - 52.0 %   MCV 92.6 80.0 - 100.0 fL   MCH 29.9 26.0 - 34.0 pg   MCHC 32.3 32.0 - 36.0 g/dL   RDW 15.6 (H) 11.5 - 14.5 %   Platelets 178 150 - 440 K/uL  Magnesium     Status: None   Collection Time: 12/05/14  7:47 AM  Result Value Ref Range   Magnesium 2.0 1.7 - 2.4 mg/dL  Troponin I     Status: None   Collection Time: 12/05/14  7:47 AM  Result Value Ref Range   Troponin I 0.03 <0.031 ng/mL    Comment:        NO INDICATION OF MYOCARDIAL INJURY.     Vitals: Blood pressure 131/84, pulse 59, temperature 98.3 F (36.8 C), temperature source Oral, resp. rate  20, height 6' 2"  (1.88 m), weight 67.1 kg (147 lb 14.9 oz), SpO2 99 %.  Risk to Self: Suicidal Ideation: Yes-Currently Present Suicidal Intent: Yes-Currently Present Is patient at risk for suicide?: Yes Suicidal Plan?: Yes-Currently Present Specify Current Suicidal Plan: overdose pf paroxetine Access to Means: Yes Specify  Access to Suicidal Means: medications What has been your use of drugs/alcohol within the last 12 months?: none How many times?: 1 Other Self Harm Risks: living alone; wife died 09/03/11 Triggers for Past Attempts: Other personal contacts Intentional Self Injurious Behavior: None Risk to Others: Homicidal Ideation: No Thoughts of Harm to Others: No Current Homicidal Intent: No Current Homicidal Plan: No Access to Homicidal Means: No Identified Victim: none History of harm to others?: No Assessment of Violence: On admission Violent Behavior Description: none Does patient have access to weapons?: No Criminal Charges Pending?: No Does patient have a court date: No Prior Inpatient Therapy: Prior Inpatient Therapy: Yes Prior Therapy Dates: 4 years ago Prior Therapy Facilty/Provider([facility]): [facility] Reason for Treatment: Depression Prior Outpatient Therapy: Prior Outpatient Therapy: Yes Prior Therapy Dates: unknown Prior Therapy Facilty/Provider(s): Dr. Nicolasa Ducking Reason for Treatment: Depression Does patient have an ACCT team?: No Does patient have Intensive In-House Services?  : No Does patient have [facility] services? : No Does patient have P4CC services?: No  Current Facility-Administered Medications  Medication Dose Route Frequency Provider Last Rate Last Dose  . acetaminophen (TYLENOL) tablet 650 mg  650 mg Oral Q6H PRN Juluis Mire, MD       Or  . acetaminophen (TYLENOL) suppository 650 mg  650 mg Rectal Q6H PRN Juluis Mire, MD      . ALPRAZolam Duanne Moron) tablet 0.5 mg  0.5 mg Oral BID Juluis Mire, MD   0.5 mg at 12/05/14 1000  . amiodarone (PACERONE) tablet 200 mg  200 mg Oral Daily Loney Hering, MD   200 mg at 12/05/14 1004  . aspirin EC tablet 81 mg  81 mg Oral Daily Juluis Mire, MD   81 mg at 12/05/14 1004  . enoxaparin (LOVENOX) injection 40 mg  40 mg Subcutaneous Q24H Juluis Mire, MD      . levothyroxine (SYNTHROID, LEVOTHROID) tablet 75 mcg  75 mcg Oral QAC  breakfast Gonzella Lex, MD   75 mcg at 12/05/14 0757  . lisinopril (PRINIVIL,ZESTRIL) tablet 2.5 mg  2.5 mg Oral Daily Juluis Mire, MD   2.5 mg at 12/05/14 1001  . pantoprazole (PROTONIX) EC tablet 40 mg  40 mg Oral Daily Gonzella Lex, MD   40 mg at 12/05/14 1004  . potassium chloride SA (K-DUR,KLOR-CON) CR tablet 20 mEq  20 mEq Oral BID Vaughan Basta, MD   20 mEq at 12/05/14 1004  . sodium chloride 0.9 % injection 3 mL  3 mL Intravenous Q12H Juluis Mire, MD   3 mL at 12/05/14 1010    Musculoskeletal: Strength & Muscle Tone: within normal limits Gait & Station: ataxic Patient leans: N/A  Psychiatric Specialty Exam: Physical Exam  Constitutional: He appears well-developed and well-nourished.  HENT:  Head: Normocephalic and atraumatic.  Eyes: Conjunctivae are normal. Pupils are equal, round, and reactive to light.  Neck: Normal range of motion.  Cardiovascular: Normal heart sounds.   Respiratory: Effort normal.  GI: Soft.  Musculoskeletal: Normal range of motion.  Neurological: He is alert.  Skin: Skin is warm and dry.  Psychiatric: His speech is delayed. He is slowed. Cognition and memory are impaired. He  exhibits a depressed mood. He expresses suicidal ideation.    Review of Systems  Constitutional: Negative.   HENT: Negative.   Eyes: Negative.   Respiratory: Negative.   Cardiovascular: Negative.   Gastrointestinal: Negative.   Musculoskeletal: Negative.   Skin: Negative.   Neurological: Negative.   Psychiatric/Behavioral: Positive for depression and suicidal ideas. Negative for hallucinations and substance abuse. The patient has insomnia. The patient is not nervous/anxious.     Blood pressure 131/84, pulse 59, temperature 98.3 F (36.8 C), temperature source Oral, resp. rate 20, height 6' 2"  (1.88 m), weight 67.1 kg (147 lb 14.9 oz), SpO2 99 %.Body mass index is 18.98 kg/(m^2).  General Appearance: Fairly Groomed  Engineer, water::  Fair  Speech:  Slow   Volume:  Decreased  Mood:  Depressed  Affect:  Depressed  Thought Process:  Linear  Orientation:  Full (Time, Place, and Person)  Thought Content:  Negative  Suicidal Thoughts:  Yes.  with intent/plan  Homicidal Thoughts:  No  Memory:  Immediate;   Fair Recent;   Fair Remote;   Fair  Judgement:  Fair  Insight:  Lacking  Psychomotor Activity:  Normal  Concentration:  Fair  Recall:  [employer] of Knowledge:Fair  Language: Fair  Akathisia:  Negative  Handed:  Right  AIMS (if indicated):     Assets:  Communication Skills Desire for Improvement Housing  ADL's:  Intact  Cognition: Impaired,  Mild  Sleep:      Medical Decision Making: Review of Psycho-Social Stressors (1), Review or order clinical lab tests (1), Established Problem, Worsening (2), Review of Last Therapy Session (1) and Review of Medication Regimen & Side Effects (2)  Treatment Plan Summary: Plan Patient was admitted to the medical service because of some cardiac arrhythmias probably associated with his overdose of paroxetine. Now appears to be stabilizing. No acute physical complaints. He is minimizing his symptoms of depression but admits that he had serious suicidal ideation yesterday. Patient has multiple risk factors for suicide and needs inpatient treatment for stability. He is age precludes his admission to the psychiatry ward here. Please refer him to a geriatric psychiatry unit in the area. Patient advised of plan. Continue sitter for now.  Plan:  Recommend psychiatric Inpatient admission when medically cleared. Disposition: Continue sitter continue IVC admit to psychiatry but he needs a gero unit  Alethia Berthold 12/05/2014 4:29 PM

## 2014-12-06 LAB — BASIC METABOLIC PANEL
ANION GAP: 10 (ref 5–15)
BUN: 16 mg/dL (ref 6–20)
CALCIUM: 9.3 mg/dL (ref 8.9–10.3)
CO2: 30 mmol/L (ref 22–32)
Chloride: 101 mmol/L (ref 101–111)
Creatinine, Ser: 1.2 mg/dL (ref 0.61–1.24)
GFR calc non Af Amer: 56 mL/min — ABNORMAL LOW (ref >60)
Glucose, Bld: 99 mg/dL (ref 65–99)
Potassium: 3.5 mmol/L (ref 3.5–5.1)
Sodium: 141 mmol/L (ref 135–145)

## 2014-12-06 MED ORDER — ASPIRIN 81 MG PO TBEC: 81.0000 mg | DELAYED_RELEASE_TABLET | Freq: Every day | ORAL | Status: DC

## 2014-12-06 MED ORDER — AMIODARONE HCL 200 MG PO TABS: 200.0000 mg | ORAL_TABLET | Freq: Every day | ORAL | Status: DC

## 2014-12-06 NOTE — Progress Notes (Signed)
SUBJECTIVE: feeling much better as out of CCU.   Filed Vitals:   12/05/14 1215 12/05/14 2056 12/06/14 0600 12/06/14 0731  BP: 131/84 137/75 126/75 119/63  Pulse: 59 73 67 64  Temp: 98.3 F (36.8 C) 98.1 F (36.7 C) 98.5 F (36.9 C) 98.5 F (36.9 C)  TempSrc:  Oral Oral Oral  Resp: 20 20  16   Height:      Weight:      SpO2: 99% 97% 100% 97%    Intake/Output Summary (Last 24 hours) at 12/06/14 1036 Last data filed at 12/06/14 0952  Gross per 24 hour  Intake    480 ml  Output   1356 ml  Net   -876 ml    LABS: Basic Metabolic Panel:  Recent Labs  12/08/14 1543 12/05/14 0747 12/06/14 0834  NA 142 143 141  K 3.0* 3.0* 3.5  CL 104 100* 101  CO2 27 31 30   GLUCOSE 84 109* 99  BUN 11 7 16   CREATININE 1.15 1.03 1.20  CALCIUM 8.5* 9.6 9.3  MG 1.5* 2.0  --    Liver Function Tests:  Recent Labs  12/04/14 1543  AST 26  ALT 11*  ALKPHOS 62  BILITOT 0.3  PROT 6.2*  ALBUMIN 3.1*   No results for input(s): LIPASE, AMYLASE in the last 72 hours. CBC:  Recent Labs  12/04/14 1543 12/05/14 0747  WBC 3.1* 6.6  NEUTROABS 1.5  --   HGB 11.0* 13.2  HCT 34.1* 40.8  MCV 92.9 92.6  PLT 135* 178   Cardiac Enzymes:  Recent Labs  12/04/14 2314 12/05/14 0239 12/05/14 0747  TROPONINI 0.04* 0.03 0.03   BNP: Invalid input(s): POCBNP D-Dimer: No results for input(s): DDIMER in the last 72 hours. Hemoglobin A1C: No results for input(s): HGBA1C in the last 72 hours. Fasting Lipid Panel: No results for input(s): CHOL, HDL, LDLCALC, TRIG, CHOLHDL, LDLDIRECT in the last 72 hours. Thyroid Function Tests: No results for input(s): TSH, T4TOTAL, T3FREE, THYROIDAB in the last 72 hours.  Invalid input(s): FREET3 Anemia Panel: No results for input(s): VITAMINB12, FOLATE, FERRITIN, TIBC, IRON, RETICCTPCT in the last 72 hours.   PHYSICAL EXAM General: Well developed, well nourished, in no acute distress HEENT:  Normocephalic and atramatic Neck:  No JVD.  Lungs: Clear  bilaterally to auscultation and percussion. Heart: HRRR . Normal S1 and S2 without gallops or murmurs.  Abdomen: Bowel sounds are positive, abdomen soft and non-tender  Msk:  Back normal, normal gait. Normal strength and tone for age. Extremities: No clubbing, cyanosis or edema.   Neuro: Alert and oriented X 3. Psych:  Good affect, responds appropriately  TELEMETRY:NSR  ASSESSMENT AND PLAN: Patient has cardiomyopathy with moderate to severe LV systolic dysfunction. Probably has CAD, but not having chest pains. VT, probably was due to drug overdosage and electrolyte imbalance and has not occurred since then. Advise out patient w/u for CAD.  Principal Problem:   Drug overdose, intentional Active Problems:   Major depression   Suicidal behavior   Hypothyroid   Hypertension   Arrhythmia, ventricular   CAD (coronary artery disease)   Depression    KHAN,SHAUKAT A, MD, [facility] 12/06/2014 10:36 AM

## 2014-12-06 NOTE — Clinical Social Work Note (Signed)
Per Psy recommendation CSW is currently working to find geri psy facility for pt. Facilities contacted listed below.  Once current fax finishes CSW will fax out to additional 8 facilities.    [place] [facility]

## 2014-12-06 NOTE — Clinical Social Work Note (Signed)
CSW called sheriff for transport to [facility] for pt.  Pt's family was able to bring clothing for pt to transfer with.

## 2014-12-06 NOTE — Discharge Instructions (Signed)
Follow with PMD in 2 weeks. °

## 2014-12-06 NOTE — Clinical Social Work Note (Signed)
CSW was able to find geri psy bed in [facility].  CSW has send DC packet to facility and will call for sheriff transport once family has brought clothes to [facility] for pt to transfer with.  ETA for family is 3:15pm  CSW notified pt's daughter Clydie Braun [phone].  CSW explained that this placement is needed to continue pt's treatment.  Pt's daughter confirmed that she understood this.  CSW signing off.

## 2014-12-06 NOTE — Discharge Summary (Signed)
[facility] [place] [facility]   PATIENT NAME: Matthew Foley    MR#:  [id number]  DATE OF BIRTH:  06-Feb-1933  DATE OF ADMISSION:  12/04/2014 ADMITTING PHYSICIAN: Crissie Figures, MD  DATE OF DISCHARGE: 12/06/2014  PRIMARY CARE PHYSICIAN: Jaclyn Shaggy, MD    ADMISSION DIAGNOSIS:  Ventricular tachycardia (paroxysmal) [I47.2] Intentional drug overdose, initial encounter [T50.902A] Involuntary commitment [Z04.6]  DISCHARGE DIAGNOSIS:  Principal Problem:   Drug overdose, intentional Active Problems:   Major depression   Suicidal behavior   Hypothyroid   Hypertension   Arrhythmia, ventricular   CAD (coronary artery disease)   Depression   SECONDARY DIAGNOSIS:   Past Medical History  Diagnosis Date  . Depression   . Hypertension   . Hyperlipidemia   . CAD (coronary artery disease)     at least 1 stent  . Hx of pancreatitis   . Rheumatoid arthritis   . Hypothyroidism   . GERD (gastroesophageal reflux disease)     HOSPITAL COURSE:   1. Intentional drug overdosage with the 29 pills of Paxil 20 mg each with suicidal ideation, status post psychiatric evaluation advised inpatient psychiatric admission once medically stable.   kept 24 hrs on telemetry- remained stable- started amiodarone.   medically stable now- as per Psych- can be d/c to [facility] psych unit. 2. Arrhythmia with intermittent episodes of V. tach on cardiac monitoring, patient asymptomatic, vital signs stable. Started on oral amiodarone by cardiologist advice. 3. Hypertension, stable on home medications. 4. History of coronary artery disease, stable. No chest pain, troponin 2 WNL. negative troponins, appreciated cardiology recommendation for further advice. 5. Depression, worsening. Evaluated by psych in ED. Follow-up psychiatric Recommendations. 6. Hypothyroidism, stable on Synthroid. Continue same. 7. Hypokalemia- replaced.  DISCHARGE CONDITIONS:   Stable.  CONSULTS  OBTAINED:  Treatment Team:  Audery Amel, MD Yevonne Pax, MD  DRUG ALLERGIES:  Not on File  DISCHARGE MEDICATIONS:   Current Discharge Medication List    START taking these medications   Details  amiodarone (PACERONE) 200 MG tablet Take 1 tablet (200 mg total) by mouth daily. Qty: 30 tablet, Refills: 0    aspirin EC 81 MG EC tablet Take 1 tablet (81 mg total) by mouth daily. Qty: 30 tablet, Refills: 0      CONTINUE these medications which have NOT CHANGED   Details  ALPRAZolam (XANAX) 0.5 MG tablet Take 0.5 mg by mouth 2 (two) times daily.    baclofen (LIORESAL) 10 MG tablet Take 10 mg by mouth 3 (three) times daily.    lansoprazole (PREVACID) 30 MG capsule Take 30 mg by mouth daily at 12 noon.    levothyroxine (SYNTHROID, LEVOTHROID) 75 MCG tablet Take 75 mcg by mouth daily before breakfast.    lisinopril (PRINIVIL,ZESTRIL) 2.5 MG tablet Take 2.5 mg by mouth daily.    Multiple Vitamins-Minerals (MULTIVITAMIN WITH MINERALS) tablet Take 1 tablet by mouth daily.    PARoxetine (PAXIL) 20 MG tablet Take 20 mg by mouth daily.         DISCHARGE INSTRUCTIONS:    Follow with PMD and psychiatry in 2 weeks.  If you experience worsening of your admission symptoms, develop shortness of breath, life threatening emergency, suicidal or homicidal thoughts you must seek medical attention immediately by calling 911 or calling your MD immediately  if symptoms less severe.  You Must read complete instructions/literature along with all the possible adverse reactions/side effects for all the Medicines you take and that have been  prescribed to you. Take any new Medicines after you have completely understood and accept all the possible adverse reactions/side effects.   Please note  You were cared for by a hospitalist during your hospital stay. If you have any questions about your discharge medications or the care you received while you were in the hospital after you are discharged,  you can call the unit and asked to speak with the hospitalist on call if the hospitalist that took care of you is not available. Once you are discharged, your primary care physician will handle any further medical issues. Please note that NO REFILLS for any discharge medications will be authorized once you are discharged, as it is imperative that you return to your primary care physician (or establish a relationship with a primary care physician if you do not have one) for your aftercare needs so that they can reassess your need for medications and monitor your lab values.    Today   CHIEF COMPLAINT:   Chief Complaint  Patient presents with  . Drug Overdose   no complains today.  HISTORY OF PRESENT ILLNESS:  Matthew Foley  is a 81 y.o. male with a known history of ongoing depression following his wife's death 3 years ago with recent worsening brought into the ED with the complaints of interstitial drug OD with the 29 pills of Paxil 20 mg each with suicidal ideation. Patient was evaluated by the ED physician and was found to be alert awake and oriented on arrival, initial EKG showed normal sinus rhythm with ventricular rate of 74 bpm, bifascicular proximal and a QTC intervals 523. Patient had transient hypotension while in the ED for which he received IV fluids normal saline bolus and since then his blood pressure is stable and maintaining in normal range. Poison Control Center was contacted by the ED physician who recommended supportive treatment only at this time with close monitoring. Psychiatric on call was consulted by the ED physician who evaluated the patient and advised inpatient psychiatric admission once medically stable. Patient was being monitored in the ED and was completely asymptomatic but on cardiac monitor he was noted to have intermittent episodes of runs of V. tach lasting for a few seconds each time. Patient continued to be alert awake and oriented without any symptoms such as chest  pain shortness of breath dizziness or palpitations. Cardiology on call was consulted by the ED physician who recommended to start amiodarone and advised further monitoring. Patient was given 200 mg of amiodarone by mouth and hospitalist service was considered for further management   VITAL SIGNS:  Blood pressure 137/88, pulse 73, temperature 97.2 F (36.2 C), temperature source Oral, resp. rate 18, height 6\' 2"  (1.88 m), weight 67.1 kg (147 lb 14.9 oz), SpO2 100 %.  I/O:   Intake/Output Summary (Last 24 hours) at 12/06/14 1232 Last data filed at 12/06/14 0952  Gross per 24 hour  Intake    240 ml  Output   1356 ml  Net  -1116 ml    PHYSICAL EXAMINATION:  GENERAL: 81 y.o.-year-old patient lying in the bed with no acute distress.  EYES: Pupils equal, round, reactive to light and accommodation. No scleral icterus. Extraocular muscles intact.  HEENT: Head atraumatic, normocephalic. Oropharynx and nasopharynx clear.  NECK: Supple, no jugular venous distention. No thyroid enlargement, no tenderness.  LUNGS: Normal breath sounds bilaterally, no wheezing, rales,rhonchi or crepitation. No use of accessory muscles of respiration.  CARDIOVASCULAR: S1, S2 normal. No murmurs, rubs, or gallops.  ABDOMEN: Soft, nontender, nondistended. Bowel sounds present. No organomegaly or mass.  EXTREMITIES: No pedal edema, cyanosis, or clubbing.  NEUROLOGIC: Cranial nerves II through XII are intact. Muscle strength 5/5 in all extremities. Sensation intact. Gait not checked.  PSYCHIATRIC: The patient is alert and oriented x 3.  SKIN: No obvious rash, lesion, or ulcer.   DATA REVIEW:   CBC  Recent Labs Lab 12/05/14 0747  WBC 6.6  HGB 13.2  HCT 40.8  PLT 178    Chemistries   Recent Labs Lab 12/04/14 1543 12/05/14 0747 12/06/14 0834  NA 142 143 141  K 3.0* 3.0* 3.5  CL 104 100* 101  CO2 27 31 30   GLUCOSE 84 109* 99  BUN 11 7 16   CREATININE 1.15 1.03 1.20  CALCIUM 8.5* 9.6 9.3   MG 1.5* 2.0  --   AST 26  --   --   ALT 11*  --   --   ALKPHOS 62  --   --   BILITOT 0.3  --   --     Cardiac Enzymes  Recent Labs Lab 12/05/14 0747  TROPONINI 0.03    Microbiology Results  Results for orders placed or performed during the hospital encounter of 12/04/14  MRSA PCR Screening     Status: None   Collection Time: 12/05/14  6:30 AM  Result Value Ref Range Status   MRSA by PCR NEGATIVE NEGATIVE Final    Comment:        The GeneXpert MRSA Assay (FDA approved for NASAL specimens only), is one component of a comprehensive MRSA colonization surveillance program. It is not intended to diagnose MRSA infection nor to guide or monitor treatment for MRSA infections.     RADIOLOGY:  No results found.   Management plans discussed with the patient, family and they are in agreement.  CODE STATUS:     Code Status Orders        Start     Ordered   12/05/14 0603  Full code   Continuous     12/05/14 0602      TOTAL TIME TAKING CARE OF THIS PATIENT: 35 minutes.    12/07/14 M.D on 12/06/2014 at 12:32 PM  Between 7am to 6pm - Pager - [phone]  After 6pm go to [email or web address] - password [email or web address] [facility]  [facility]  Office  [phone]  CC: Primary care physician; Natrona heights, MD

## 2014-12-17 ENCOUNTER — Ambulatory Visit: Payer: Medicare PPO | Admitting: Psychiatry

## 2015-01-09 IMAGING — CR DG ABDOMEN 3V
1 series · 4 of 4 positions shown · non-contrast
Comparison: CT dated 01/15/2014. Chest radiographs dated
10/21/2012.

CLINICAL DATA: Epigastric abdominal pain and vomiting.

EXAM:
ABDOMEN SERIES

[Series 1: w chest pa · 0.14mm/px · 4 of 4 slices shown]
[im 1/4]
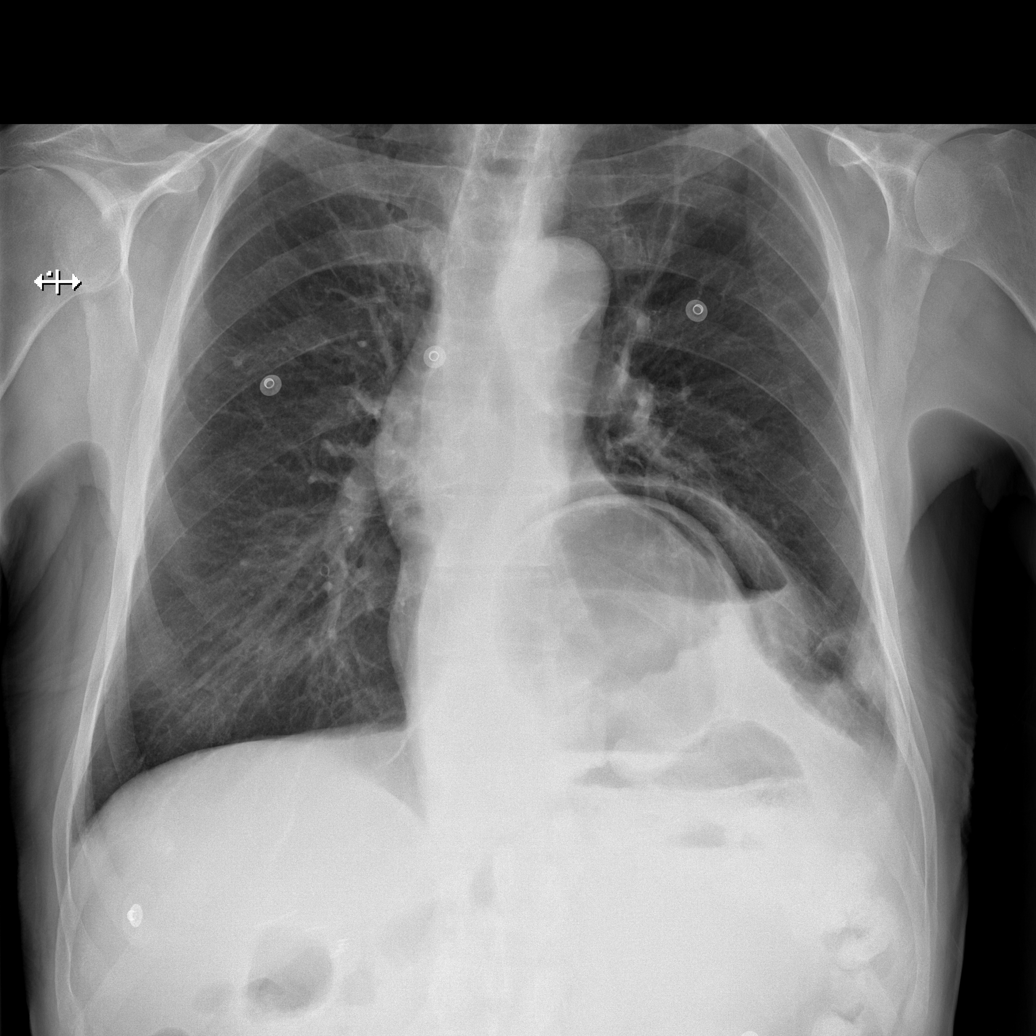
[im 2/4]
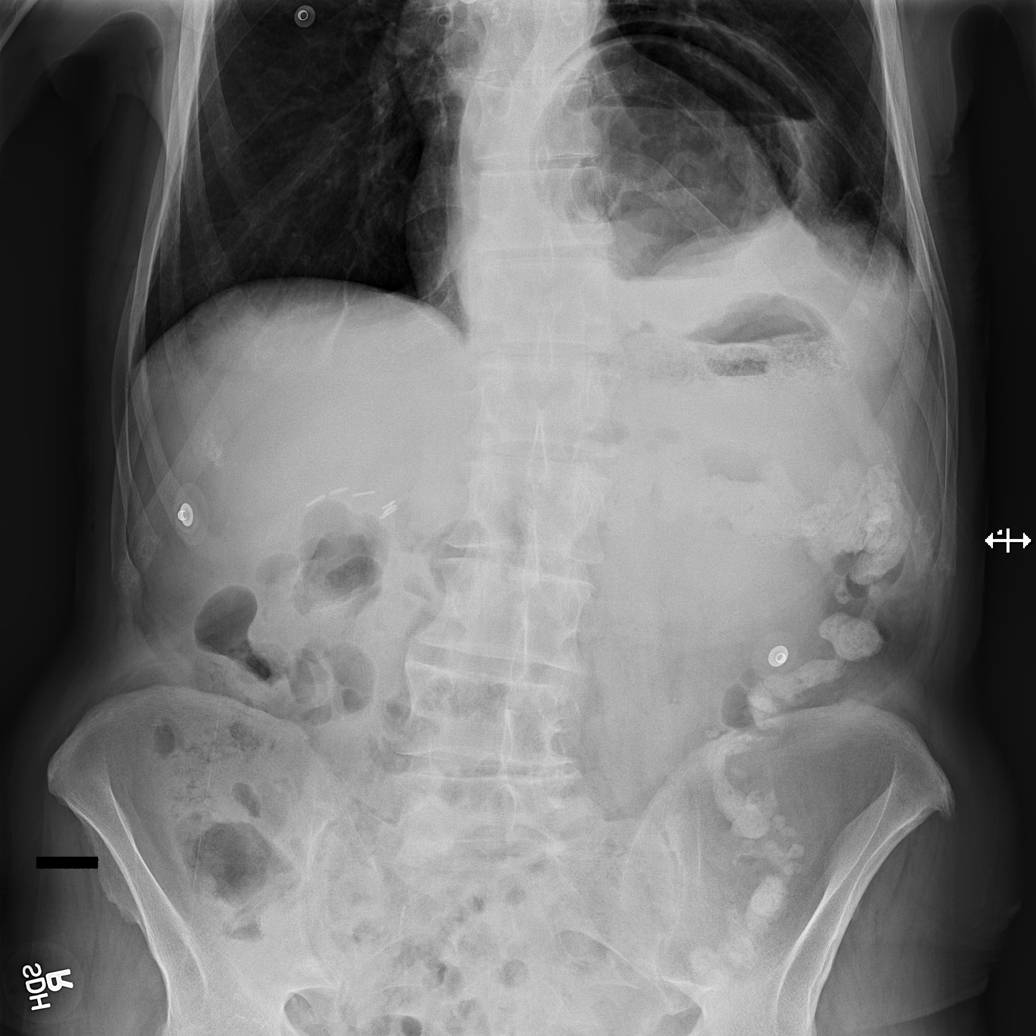
[im 3/4]
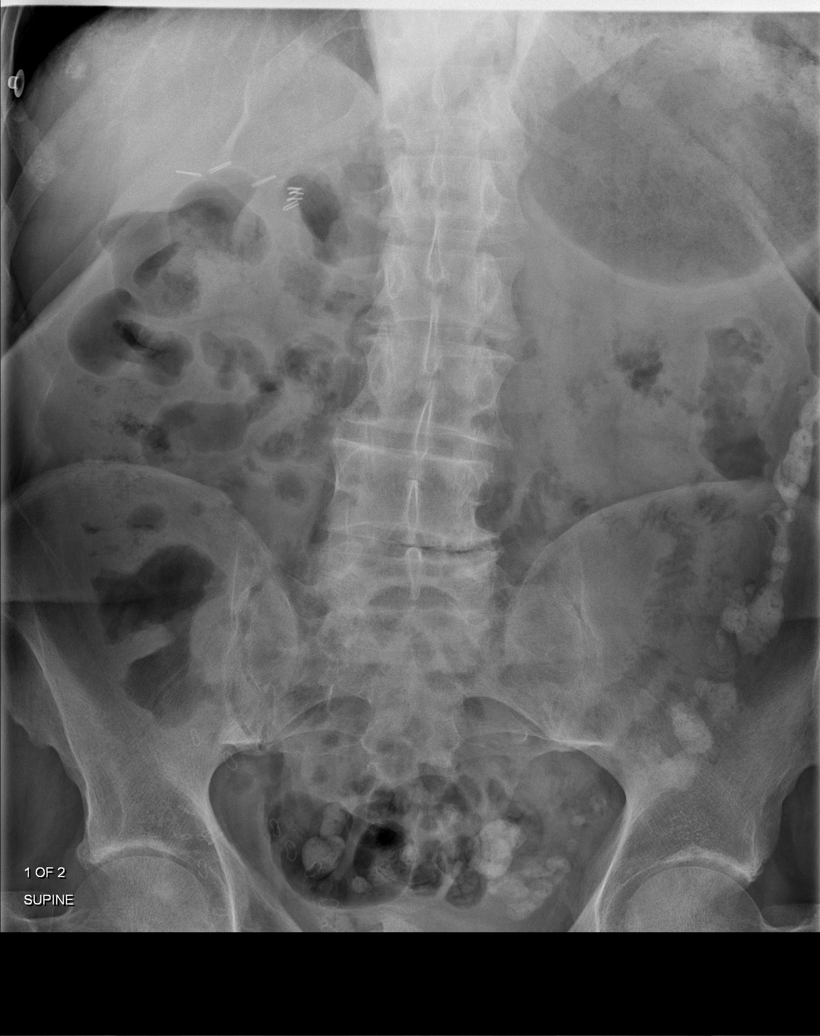
[im 4/4]
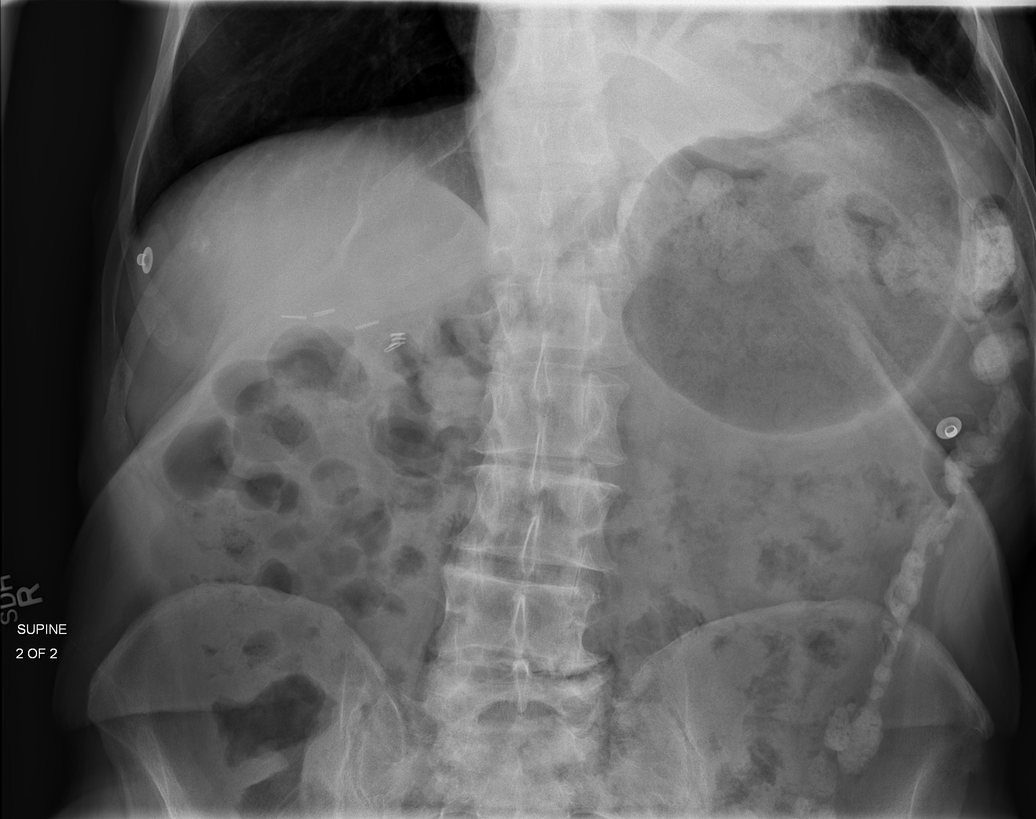

[4 of 4 positions shown; findings below may reference images not displayed]

FINDINGS: Normal sized heart. A large hiatal hernia is again demonstrated with
scarring at the left lung base. There is persistent free peritoneal
air within the hiatal hernia. There is also a suggestion of interval
gastric fold thickening within the hiatal hernia. The patient had a
cholecystectomy 12 days ago. The right lung remains clear. A small
calcified granuloma on the right is unchanged. Normal bowel gas
pattern. Cholecystectomy clips. Right lower pelvic skin clips.
IMPRESSION: Large hiatal hernia with continued free peritoneal air within the
hiatal hernia. There is also a possible edema of the stomach within
the hernia. The current symptoms and persistent free peritoneal air
raise the possibility of obstruction or perforation of the hernia,
possibly due to volvulus. Recommend consideration of direct
endoscopic visualization.

These results were called by telephone at the time of interpretation
on 01/21/2014 at [DATE] to Dr. MUGGIE NAKA , who verbally
acknowledged these results.

## 2015-08-28 ENCOUNTER — Other Ambulatory Visit: Payer: Self-pay | Admitting: Cardiology

## 2015-08-28 DIAGNOSIS — R9389 Abnormal findings on diagnostic imaging of other specified body structures: Secondary | ICD-10-CM

## 2015-08-29 ENCOUNTER — Emergency Department: Payer: Medicare PPO

## 2015-08-29 ENCOUNTER — Encounter: Payer: Self-pay | Admitting: *Deleted

## 2015-08-29 ENCOUNTER — Emergency Department
Admission: EM | Admit: 2015-08-29 | Discharge: 2015-08-29 | Disposition: A | Discharge status: Home / Self Care | Payer: Medicare PPO | Attending: Emergency Medicine | Admitting: Emergency Medicine

## 2015-08-29 DIAGNOSIS — I10 Essential (primary) hypertension: Secondary | ICD-10-CM | POA: Insufficient documentation

## 2015-08-29 DIAGNOSIS — Z87891 Personal history of nicotine dependence: Secondary | ICD-10-CM | POA: Insufficient documentation

## 2015-08-29 DIAGNOSIS — L03119 Cellulitis of unspecified part of limb: Secondary | ICD-10-CM | POA: Insufficient documentation

## 2015-08-29 DIAGNOSIS — Z79899 Other long term (current) drug therapy: Secondary | ICD-10-CM | POA: Diagnosis not present

## 2015-08-29 DIAGNOSIS — Z7982 Long term (current) use of aspirin: Secondary | ICD-10-CM | POA: Diagnosis not present

## 2015-08-29 DIAGNOSIS — R2243 Localized swelling, mass and lump, lower limb, bilateral: Secondary | ICD-10-CM | POA: Diagnosis present

## 2015-08-29 DIAGNOSIS — L0390 Cellulitis, unspecified: Secondary | ICD-10-CM

## 2015-08-29 LAB — BASIC METABOLIC PANEL
ANION GAP: 10 (ref 5–15)
BUN: 18 mg/dL (ref 6–20)
CO2: 26 mmol/L (ref 22–32)
CREATININE: 0.85 mg/dL (ref 0.61–1.24)
Calcium: 8.8 mg/dL — ABNORMAL LOW (ref 8.9–10.3)
Chloride: 103 mmol/L (ref 101–111)
GFR calc Af Amer: >60 mL/min (ref >60)
Glucose, Bld: 99 mg/dL (ref 65–99)
Potassium: 3.9 mmol/L (ref 3.5–5.1)
Sodium: 139 mmol/L (ref 135–145)

## 2015-08-29 LAB — CBC
HCT: 38 % — ABNORMAL LOW (ref 40.0–52.0)
Hemoglobin: 12.4 g/dL — ABNORMAL LOW (ref 13.0–18.0)
MCH: 31.4 pg (ref 26.0–34.0)
MCHC: 32.6 g/dL (ref 32.0–36.0)
MCV: 96.4 fL (ref 80.0–100.0)
PLATELETS: 145 10*3/uL — AB (ref 150–440)
RBC: 3.94 MIL/uL — ABNORMAL LOW (ref 4.40–5.90)
RDW: 16 % — ABNORMAL HIGH (ref 11.5–14.5)
WBC: 6.1 10*3/uL (ref 3.8–10.6)

## 2015-08-29 LAB — BRAIN NATRIURETIC PEPTIDE: B Natriuretic Peptide: 335 pg/mL — ABNORMAL HIGH (ref 0.0–100.0)

## 2015-08-29 LAB — TROPONIN I: Troponin I: <0.03 ng/mL (ref <0.031)

## 2015-08-29 MED ORDER — CEPHALEXIN 500 MG PO CAPS: 500.0000 mg | ORAL_CAPSULE | Freq: Four times a day (QID) | ORAL | Status: DC

## 2015-08-29 MED ORDER — CEPHALEXIN 500 MG PO CAPS
500.0000 mg | ORAL_CAPSULE | Freq: Once | ORAL | Status: AC
  Administered 2015-08-29: 500 mg via ORAL
  Filled 2015-08-29: qty 1

## 2015-08-29 NOTE — ED Provider Notes (Signed)
[facility] Emergency Department Provider Note    ____________________________________________  Time seen: ~2110  I have reviewed the triage vital signs and the nursing notes.   HISTORY  Chief Complaint Leg Swelling   History limited by: Not Limited   HPI Matthew Foley is a 82 y.o. male who presents to the emergency department today because of concern for bilateral leg swelling and edema. Patient was sent from primary care. The patient states that he first started noticing prongs with his lower extremities 2 days ago. He states it did start to swell and become red. He denies any significant pain. He states it has been a little itchy and every once all have a twinge of discomfort. Patient denies any fevers. He denies any associated chest pain, shortness of breath, nausea or vomiting.    Past Medical History  Diagnosis Date  . Depression   . Hypertension   . Hyperlipidemia   . CAD (coronary artery disease)     at least 1 stent  . Hx of pancreatitis   . Rheumatoid arthritis (HCC)   . Hypothyroidism   . GERD (gastroesophageal reflux disease)     Patient Active Problem List   Diagnosis Date Noted  . Drug overdose, intentional (HCC) 12/05/2014  . Arrhythmia, ventricular 12/05/2014  . CAD (coronary artery disease) 12/05/2014  . Depression 12/05/2014  . Major depression (HCC) 12/04/2014  . Suicidal behavior 12/04/2014  . Hypothyroid 12/04/2014  . Hypertension 12/04/2014    Past Surgical History  Procedure Laterality Date  . Knee replacement Bilateral   . Cholecystectomy    . Hernia repair    . Toe amputation      Right, 2nd toe  . Joint replacement      Current Outpatient Rx  Name  Route  Sig  Dispense  Refill  . ALPRAZolam (XANAX) 0.5 MG tablet   Oral   Take 0.5 mg by mouth 2 (two) times daily.         [name] amiodarone (PACERONE) 200 MG tablet   Oral   Take 1 tablet (200 mg total) by mouth daily.   30 tablet   0   . aspirin  EC 81 MG EC tablet   Oral   Take 1 tablet (81 mg total) by mouth daily.   30 tablet   0   . baclofen (LIORESAL) 10 MG tablet   Oral   Take 10 mg by mouth 3 (three) times daily.         . lansoprazole (PREVACID) 30 MG capsule   Oral   Take 30 mg by mouth daily at 12 noon.         [name] levothyroxine (SYNTHROID, LEVOTHROID) 75 MCG tablet   Oral   Take 75 mcg by mouth daily before breakfast.         . lisinopril (PRINIVIL,ZESTRIL) 2.5 MG tablet   Oral   Take 2.5 mg by mouth daily.         . Multiple Vitamins-Minerals (MULTIVITAMIN WITH MINERALS) tablet   Oral   Take 1 tablet by mouth daily.         [name] PARoxetine (PAXIL) 20 MG tablet   Oral   Take 20 mg by mouth daily.           Allergies Review of patient's allergies indicates no known allergies.  Family History  Problem Relation Age of Onset  . Hypertension Mother   . Hypertension Father   . CAD Father  Social History Social History  Substance Use Topics  . Smoking status: Former Games developer  . Smokeless tobacco: Former Neurosurgeon  . Alcohol Use: Yes    Review of Systems  Constitutional: Negative for fever. Cardiovascular: Negative for chest pain. Respiratory: Negative for shortness of breath. Gastrointestinal: Negative for abdominal pain, vomiting and diarrhea. Neurological: Negative for headaches, focal weakness or numbness.  10-point ROS otherwise negative.  ____________________________________________   PHYSICAL EXAM:  VITAL SIGNS: ED Triage Vitals  Enc Vitals Group     BP 08/29/15 1723 133/75 mmHg     Pulse Rate 08/29/15 1723 65     Resp 08/29/15 1723 20     Temp 08/29/15 1723 98 F (36.7 C)     Temp Source 08/29/15 1723 Oral     SpO2 08/29/15 1723 96 %     Weight 08/29/15 1723 168 lb (76.204 kg)     Height 08/29/15 1723 6\' 2"  (1.88 m)   Constitutional: Alert and oriented. Well appearing and in no distress. Eyes: Conjunctivae are normal. PERRL. Normal extraocular movements. ENT    Head: Normocephalic and atraumatic.   Nose: No congestion/rhinnorhea.   Mouth/Throat: Mucous membranes are moist.   Neck: No stridor. Hematological/Lymphatic/Immunilogical: No cervical lymphadenopathy. Cardiovascular: Normal rate, regular rhythm.  No murmurs, rubs, or gallops. Respiratory: Normal respiratory effort without tachypnea nor retractions. Breath sounds are clear and equal bilaterally. No wheezes/rales/rhonchi. Gastrointestinal: Soft and nontender. No distention.  Genitourinary: Deferred Musculoskeletal: Normal range of motion in all extremities. No joint effusions.  Bilateral lower extremity edema, erythema. Some cracking of the skin. Dorsalis pedis 2+ bilateral lower extremities. Neurologic:  Normal speech and language. No gross focal neurologic deficits are appreciated.  Skin:  Skin is warm, dry and intact. No rash noted. Psychiatric: Mood and affect are normal. Speech and behavior are normal. Patient exhibits appropriate insight and judgment.  ____________________________________________    LABS (pertinent positives/negatives)  Labs Reviewed  BASIC METABOLIC PANEL - Abnormal; Notable for the following:    Calcium 8.8 (*)    All other components within normal limits  CBC - Abnormal; Notable for the following:    RBC 3.94 (*)    Hemoglobin 12.4 (*)    HCT 38.0 (*)    RDW 16.0 (*)    Platelets 145 (*)    All other components within normal limits  BRAIN NATRIURETIC PEPTIDE - Abnormal; Notable for the following:    B Natriuretic Peptide 335.0 (*)    All other components within normal limits  TROPONIN I     ____________________________________________   EKG  I, , attending physician, personally viewed and interpreted this EKG  EKG Time: 1731 Rate: 63 Rhythm: normal sinus rhythm Axis: left axis deviation Intervals: qtc 466 QRS: RBBB, LAFB ST changes: no st elevation Impression: abnormal  ekg   ____________________________________________    RADIOLOGY  CXR  IMPRESSION: Chronic lung disease. No superimposed acute abnormality.   ____________________________________________   PROCEDURES  Procedure(s) performed: None  Critical Care performed: No  ____________________________________________   INITIAL IMPRESSION / ASSESSMENT AND PLAN / ED COURSE  Pertinent labs & imaging results that were available during my care of the patient were reviewed by me and considered in my medical decision making (see chart for details).  Patient presented to the emergency department today because of concerns for bilateral lower extremity swelling and erythema. On exam patient does have bilateral swelling, erythema to the lower extremities. No significant tenderness. Patient without any systemic signs of illness. This point will treat for  cellulitis. I did discuss return precautions. Additionally did recommend dermatology follow-up if the symptoms do not resolve with antibiotics.  ____________________________________________   FINAL CLINICAL IMPRESSION(S) / ED DIAGNOSES  Final diagnoses:  Cellulitis, unspecified cellulitis site, unspecified extremity site, unspecified laterality     Phineas Semen, MD 08/29/15 2233

## 2015-08-29 NOTE — Discharge Instructions (Signed)
Please seek medical attention for any high fevers, chest pain, shortness of breath, change in behavior, persistent vomiting, bloody stool or any other new or concerning symptoms. ° ° °Cellulitis °Cellulitis is an infection of the skin and the tissue beneath it. The infected area is usually red and tender. Cellulitis occurs most often in the arms and lower legs.  °CAUSES  °Cellulitis is caused by bacteria that enter the skin through cracks or cuts in the skin. The most common types of bacteria that cause cellulitis are staphylococci and streptococci. °SIGNS AND SYMPTOMS  °· Redness and warmth. °· Swelling. °· Tenderness or pain. °· Fever. °DIAGNOSIS  °Your health care provider can usually determine what is wrong based on a physical exam. Blood tests may also be done. °TREATMENT  °Treatment usually involves taking an antibiotic medicine. °HOME CARE INSTRUCTIONS  °· Take your antibiotic medicine as directed by your health care provider. Finish the antibiotic even if you start to feel better. °· Keep the infected arm or leg elevated to reduce swelling. °· Apply a warm cloth to the affected area up to 4 times per day to relieve pain. °· Take medicines only as directed by your health care provider. °· Keep all follow-up visits as directed by your health care provider. °SEEK MEDICAL CARE IF:  °· You notice red streaks coming from the infected area. °· Your red area gets larger or turns dark in color. °· Your bone or joint underneath the infected area becomes painful after the skin has healed. °· Your infection returns in the same area or another area. °· You notice a swollen bump in the infected area. °· You develop new symptoms. °· You have a fever. °SEEK IMMEDIATE MEDICAL CARE IF:  °· You feel very sleepy. °· You develop vomiting or diarrhea. °· You have a general ill feeling (malaise) with muscle aches and pains. °  °This information is not intended to replace advice given to you by your health care provider. Make sure  you discuss any questions you have with your health care provider. °  °Document Released: 03/18/2005 Document Revised: 02/27/2015 Document Reviewed: 08/24/2011 °Elsevier Interactive Patient Education ©2016 [employer] ° °

## 2015-08-29 NOTE — ED Notes (Signed)
Pt bilateral legs are red and swollen with excoriated areas, pt denies any other symptoms, pt sent from [facility]

## 2015-09-02 ENCOUNTER — Ambulatory Visit
Admission: RE | Admit: 2015-09-02 | Discharge: 2015-09-02 | Disposition: A | Discharge status: Home / Self Care | Payer: Medicare PPO | Source: Ambulatory Visit | Attending: Cardiology | Admitting: Cardiology

## 2015-09-02 DIAGNOSIS — R928 Other abnormal and inconclusive findings on diagnostic imaging of breast: Secondary | ICD-10-CM | POA: Diagnosis present

## 2015-09-02 DIAGNOSIS — J439 Emphysema, unspecified: Secondary | ICD-10-CM | POA: Diagnosis not present

## 2015-09-02 DIAGNOSIS — R9389 Abnormal findings on diagnostic imaging of other specified body structures: Secondary | ICD-10-CM

## 2015-09-02 DIAGNOSIS — I712 Thoracic aortic aneurysm, without rupture: Secondary | ICD-10-CM | POA: Diagnosis not present

## 2015-10-28 ENCOUNTER — Encounter (INDEPENDENT_AMBULATORY_CARE_PROVIDER_SITE_OTHER): Payer: Self-pay

## 2015-10-28 ENCOUNTER — Emergency Department: Payer: Medicare PPO

## 2015-10-28 ENCOUNTER — Encounter: Payer: Self-pay | Admitting: Internal Medicine

## 2015-10-28 ENCOUNTER — Inpatient Hospital Stay
Admission: EM | Admit: 2015-10-28 | Discharge: 2015-10-31 | DRG: 557 | Disposition: A | Discharge status: Skilled Nursing Facility | Payer: Medicare PPO | Attending: Internal Medicine | Admitting: Internal Medicine

## 2015-10-28 DIAGNOSIS — N179 Acute kidney failure, unspecified: Secondary | ICD-10-CM | POA: Diagnosis not present

## 2015-10-28 DIAGNOSIS — K922 Gastrointestinal hemorrhage, unspecified: Secondary | ICD-10-CM

## 2015-10-28 DIAGNOSIS — N400 Enlarged prostate without lower urinary tract symptoms: Secondary | ICD-10-CM | POA: Diagnosis not present

## 2015-10-28 DIAGNOSIS — I2510 Atherosclerotic heart disease of native coronary artery without angina pectoris: Secondary | ICD-10-CM | POA: Diagnosis not present

## 2015-10-28 DIAGNOSIS — I472 Ventricular tachycardia: Secondary | ICD-10-CM | POA: Diagnosis not present

## 2015-10-28 DIAGNOSIS — I4891 Unspecified atrial fibrillation: Secondary | ICD-10-CM | POA: Diagnosis present

## 2015-10-28 DIAGNOSIS — Z9049 Acquired absence of other specified parts of digestive tract: Secondary | ICD-10-CM | POA: Diagnosis not present

## 2015-10-28 DIAGNOSIS — K219 Gastro-esophageal reflux disease without esophagitis: Secondary | ICD-10-CM | POA: Diagnosis present

## 2015-10-28 DIAGNOSIS — Z89421 Acquired absence of other right toe(s): Secondary | ICD-10-CM

## 2015-10-28 DIAGNOSIS — Z9181 History of falling: Secondary | ICD-10-CM | POA: Diagnosis not present

## 2015-10-28 DIAGNOSIS — R627 Adult failure to thrive: Secondary | ICD-10-CM | POA: Diagnosis not present

## 2015-10-28 DIAGNOSIS — T796XXA Traumatic ischemia of muscle, initial encounter: Secondary | ICD-10-CM

## 2015-10-28 DIAGNOSIS — M6282 Rhabdomyolysis: Secondary | ICD-10-CM | POA: Diagnosis present

## 2015-10-28 DIAGNOSIS — N182 Chronic kidney disease, stage 2 (mild): Secondary | ICD-10-CM | POA: Diagnosis present

## 2015-10-28 DIAGNOSIS — Z7982 Long term (current) use of aspirin: Secondary | ICD-10-CM

## 2015-10-28 DIAGNOSIS — Z818 Family history of other mental and behavioral disorders: Secondary | ICD-10-CM

## 2015-10-28 DIAGNOSIS — A419 Sepsis, unspecified organism: Secondary | ICD-10-CM | POA: Diagnosis not present

## 2015-10-28 DIAGNOSIS — Z8249 Family history of ischemic heart disease and other diseases of the circulatory system: Secondary | ICD-10-CM | POA: Diagnosis not present

## 2015-10-28 DIAGNOSIS — N19 Unspecified kidney failure: Secondary | ICD-10-CM

## 2015-10-28 DIAGNOSIS — K920 Hematemesis: Secondary | ICD-10-CM | POA: Diagnosis not present

## 2015-10-28 DIAGNOSIS — W19XXXA Unspecified fall, initial encounter: Secondary | ICD-10-CM | POA: Diagnosis present

## 2015-10-28 DIAGNOSIS — G9341 Metabolic encephalopathy: Secondary | ICD-10-CM | POA: Diagnosis not present

## 2015-10-28 DIAGNOSIS — Z9889 Other specified postprocedural states: Secondary | ICD-10-CM

## 2015-10-28 DIAGNOSIS — E039 Hypothyroidism, unspecified: Secondary | ICD-10-CM | POA: Diagnosis present

## 2015-10-28 DIAGNOSIS — E876 Hypokalemia: Secondary | ICD-10-CM | POA: Diagnosis present

## 2015-10-28 DIAGNOSIS — I4510 Unspecified right bundle-branch block: Secondary | ICD-10-CM | POA: Diagnosis present

## 2015-10-28 DIAGNOSIS — Z96653 Presence of artificial knee joint, bilateral: Secondary | ICD-10-CM | POA: Diagnosis present

## 2015-10-28 DIAGNOSIS — E860 Dehydration: Secondary | ICD-10-CM | POA: Diagnosis not present

## 2015-10-28 DIAGNOSIS — E43 Unspecified severe protein-calorie malnutrition: Secondary | ICD-10-CM | POA: Insufficient documentation

## 2015-10-28 DIAGNOSIS — M069 Rheumatoid arthritis, unspecified: Secondary | ICD-10-CM | POA: Diagnosis not present

## 2015-10-28 DIAGNOSIS — L8990 Pressure ulcer of unspecified site, unspecified stage: Secondary | ICD-10-CM | POA: Insufficient documentation

## 2015-10-28 DIAGNOSIS — Y92019 Unspecified place in single-family (private) house as the place of occurrence of the external cause: Secondary | ICD-10-CM | POA: Diagnosis not present

## 2015-10-28 DIAGNOSIS — Z955 Presence of coronary angioplasty implant and graft: Secondary | ICD-10-CM

## 2015-10-28 DIAGNOSIS — R52 Pain, unspecified: Secondary | ICD-10-CM

## 2015-10-28 DIAGNOSIS — Z6820 Body mass index (BMI) 20.0-20.9, adult: Secondary | ICD-10-CM

## 2015-10-28 DIAGNOSIS — I129 Hypertensive chronic kidney disease with stage 1 through stage 4 chronic kidney disease, or unspecified chronic kidney disease: Secondary | ICD-10-CM | POA: Diagnosis not present

## 2015-10-28 DIAGNOSIS — R410 Disorientation, unspecified: Secondary | ICD-10-CM

## 2015-10-28 DIAGNOSIS — I248 Other forms of acute ischemic heart disease: Secondary | ICD-10-CM | POA: Diagnosis not present

## 2015-10-28 DIAGNOSIS — Z79899 Other long term (current) drug therapy: Secondary | ICD-10-CM | POA: Diagnosis not present

## 2015-10-28 DIAGNOSIS — F332 Major depressive disorder, recurrent severe without psychotic features: Secondary | ICD-10-CM | POA: Diagnosis present

## 2015-10-28 DIAGNOSIS — Z87891 Personal history of nicotine dependence: Secondary | ICD-10-CM | POA: Diagnosis not present

## 2015-10-28 HISTORY — DX: Unspecified atrial fibrillation: I48.91

## 2015-10-28 LAB — MAGNESIUM
MAGNESIUM: 1.8 mg/dL (ref 1.7–2.4)
Magnesium: 1.7 mg/dL (ref 1.7–2.4)

## 2015-10-28 LAB — COMPREHENSIVE METABOLIC PANEL
ALK PHOS: 51 U/L (ref 38–126)
ALT: 38 U/L (ref 17–63)
AST: 116 U/L — AB (ref 15–41)
Albumin: 3.9 g/dL (ref 3.5–5.0)
Anion gap: 16 — ABNORMAL HIGH (ref 5–15)
BUN: 50 mg/dL — AB (ref 6–20)
CALCIUM: 9.8 mg/dL (ref 8.9–10.3)
CHLORIDE: 100 mmol/L — AB (ref 101–111)
CO2: 26 mmol/L (ref 22–32)
CREATININE: 1.41 mg/dL — AB (ref 0.61–1.24)
GFR calc non Af Amer: 45 mL/min — ABNORMAL LOW (ref >60)
GFR, EST AFRICAN AMERICAN: 53 mL/min — AB (ref >60)
Glucose, Bld: 173 mg/dL — ABNORMAL HIGH (ref 65–99)
Potassium: 3.4 mmol/L — ABNORMAL LOW (ref 3.5–5.1)
SODIUM: 142 mmol/L (ref 135–145)
Total Bilirubin: 1.2 mg/dL (ref 0.3–1.2)
Total Protein: 8.2 g/dL — ABNORMAL HIGH (ref 6.5–8.1)

## 2015-10-28 LAB — URINALYSIS COMPLETE WITH MICROSCOPIC (ARMC ONLY)
Bacteria, UA: NONE SEEN
Bilirubin Urine: NEGATIVE
GLUCOSE, UA: NEGATIVE mg/dL
Ketones, ur: NEGATIVE mg/dL
Leukocytes, UA: NEGATIVE
Nitrite: NEGATIVE
PROTEIN: 100 mg/dL — AB
Specific Gravity, Urine: 1.02 (ref 1.005–1.030)
pH: 5 (ref 5.0–8.0)

## 2015-10-28 LAB — CBC WITH DIFFERENTIAL/PLATELET
BASOS ABS: 0 10*3/uL (ref 0–0.1)
Basophils Relative: 0 %
EOS ABS: 0 10*3/uL (ref 0–0.7)
Eosinophils Relative: 0 %
HCT: 43.4 % (ref 40.0–52.0)
Hemoglobin: 14.4 g/dL (ref 13.0–18.0)
LYMPHS ABS: 0.9 10*3/uL — AB (ref 1.0–3.6)
MCH: 32.2 pg (ref 26.0–34.0)
MCHC: 33.2 g/dL (ref 32.0–36.0)
MCV: 97 fL (ref 80.0–100.0)
Monocytes Absolute: 1.2 10*3/uL — ABNORMAL HIGH (ref 0.2–1.0)
Monocytes Relative: 7 %
Neutro Abs: 15.4 10*3/uL — ABNORMAL HIGH (ref 1.4–6.5)
Platelets: 217 10*3/uL (ref 150–440)
RBC: 4.47 MIL/uL (ref 4.40–5.90)
RDW: 14.5 % (ref 11.5–14.5)
WBC: 17.5 10*3/uL — AB (ref 3.8–10.6)

## 2015-10-28 LAB — CK: CK TOTAL: 3635 U/L — AB (ref 49–397)

## 2015-10-28 LAB — TSH: TSH: 12.601 u[IU]/mL — AB (ref 0.350–4.500)

## 2015-10-28 LAB — PROTIME-INR
INR: 1.03
Prothrombin Time: 13.7 seconds (ref 11.4–15.0)

## 2015-10-28 LAB — TROPONIN I
TROPONIN I: 0.13 ng/mL — AB (ref <0.031)
TROPONIN I: 0.44 ng/mL — AB (ref <0.031)
Troponin I: 0.05 ng/mL — ABNORMAL HIGH (ref <0.031)

## 2015-10-28 LAB — TYPE AND SCREEN
ABO/RH(D): O POS
ANTIBODY SCREEN: NEGATIVE

## 2015-10-28 LAB — HEMOGLOBIN
Hemoglobin: 14.6 g/dL (ref 13.0–18.0)
Hemoglobin: 13.5 g/dL (ref 13.0–18.0)

## 2015-10-28 LAB — LIPASE, BLOOD: LIPASE: 18 U/L (ref 11–51)

## 2015-10-28 LAB — ABO/RH: ABO/RH(D): O POS

## 2015-10-28 MED ORDER — TETANUS-DIPHTHERIA TOXOIDS TD 5-2 LFU IM INJ
0.5000 mL | INJECTION | Freq: Once | INTRAMUSCULAR | Status: AC
  Administered 2015-10-28: 0.5 mL via INTRAMUSCULAR
  Filled 2015-10-28: qty 0.5

## 2015-10-28 MED ORDER — ROPINIROLE HCL 0.25 MG PO TABS
0.2500 mg | ORAL_TABLET | Freq: Every evening | ORAL | Status: DC
  Administered 2015-10-28 – 2015-10-30 (×3): 0.25 mg via ORAL
  Filled 2015-10-28 (×3): qty 1

## 2015-10-28 MED ORDER — LEVOTHYROXINE SODIUM 75 MCG PO TABS
75.0000 ug | ORAL_TABLET | Freq: Every day | ORAL | Status: DC
  Administered 2015-10-29: 75 ug via ORAL
  Filled 2015-10-28: qty 1

## 2015-10-28 MED ORDER — ENOXAPARIN SODIUM 40 MG/0.4ML ~~LOC~~ SOLN
40.0000 mg | SUBCUTANEOUS | Status: DC
  Administered 2015-10-28 – 2015-10-30 (×3): 40 mg via SUBCUTANEOUS
  Filled 2015-10-28 (×3): qty 0.4

## 2015-10-28 MED ORDER — ONDANSETRON HCL 4 MG/2ML IJ SOLN
4.0000 mg | Freq: Four times a day (QID) | INTRAMUSCULAR | Status: DC | PRN
  Administered 2015-10-29: 4 mg via INTRAVENOUS
  Filled 2015-10-28: qty 2

## 2015-10-28 MED ORDER — FERROUS SULFATE 325 (65 FE) MG PO TABS
325.0000 mg | ORAL_TABLET | Freq: Every day | ORAL | Status: DC
  Administered 2015-10-28 – 2015-10-31 (×4): 325 mg via ORAL
  Filled 2015-10-28 (×4): qty 1

## 2015-10-28 MED ORDER — SODIUM CHLORIDE 0.9% FLUSH
3.0000 mL | Freq: Two times a day (BID) | INTRAVENOUS | Status: DC
  Administered 2015-10-29 – 2015-10-30 (×3): 3 mL via INTRAVENOUS

## 2015-10-28 MED ORDER — PAROXETINE HCL 20 MG PO TABS
20.0000 mg | ORAL_TABLET | Freq: Every day | ORAL | Status: DC
Start: 2015-10-28 — End: 2015-10-29
  Administered 2015-10-28 – 2015-10-29 (×2): 20 mg via ORAL
  Filled 2015-10-28 (×2): qty 1

## 2015-10-28 MED ORDER — POTASSIUM CHLORIDE IN NACL 20-0.9 MEQ/L-% IV SOLN
INTRAVENOUS | Status: DC
  Administered 2015-10-28 – 2015-10-30 (×4): via INTRAVENOUS
  Filled 2015-10-28 (×6): qty 1000

## 2015-10-28 MED ORDER — SODIUM CHLORIDE 0.9 % IV SOLN
80.0000 mg | Freq: Once | INTRAVENOUS | Status: AC
  Administered 2015-10-28: 80 mg via INTRAVENOUS
  Filled 2015-10-28: qty 80

## 2015-10-28 MED ORDER — AMIODARONE HCL 200 MG PO TABS
200.0000 mg | ORAL_TABLET | Freq: Every day | ORAL | Status: DC
  Administered 2015-10-28 – 2015-10-31 (×4): 200 mg via ORAL
  Filled 2015-10-28 (×4): qty 1

## 2015-10-28 MED ORDER — POTASSIUM CHLORIDE 10 MEQ/100ML IV SOLN
10.0000 meq | INTRAVENOUS | Status: AC
  Administered 2015-10-28 (×3): 10 meq via INTRAVENOUS
  Filled 2015-10-28 (×3): qty 100

## 2015-10-28 MED ORDER — ONDANSETRON HCL 4 MG PO TABS: 4.0000 mg | ORAL_TABLET | Freq: Four times a day (QID) | ORAL | Status: DC | PRN

## 2015-10-28 MED ORDER — ACETAMINOPHEN 325 MG PO TABS
650.0000 mg | ORAL_TABLET | Freq: Four times a day (QID) | ORAL | Status: DC | PRN
  Administered 2015-10-30: 650 mg via ORAL
  Filled 2015-10-28: qty 2

## 2015-10-28 MED ORDER — PANTOPRAZOLE SODIUM 40 MG IV SOLR: 40.0000 mg | Freq: Two times a day (BID) | INTRAVENOUS | Status: DC

## 2015-10-28 MED ORDER — POTASSIUM CHLORIDE 10 MEQ/100ML IV SOLN
10.0000 meq | INTRAVENOUS | Status: AC
  Administered 2015-10-28: 10 meq via INTRAVENOUS
  Filled 2015-10-28 (×4): qty 100

## 2015-10-28 MED ORDER — TAMSULOSIN HCL 0.4 MG PO CAPS
0.4000 mg | ORAL_CAPSULE | Freq: Every day | ORAL | Status: DC
  Administered 2015-10-28 – 2015-10-31 (×4): 0.4 mg via ORAL
  Filled 2015-10-28 (×4): qty 1

## 2015-10-28 MED ORDER — DOCUSATE SODIUM 100 MG PO CAPS
100.0000 mg | ORAL_CAPSULE | Freq: Two times a day (BID) | ORAL | Status: DC
  Administered 2015-10-28 – 2015-10-31 (×6): 100 mg via ORAL
  Filled 2015-10-28 (×6): qty 1

## 2015-10-28 MED ORDER — ALPRAZOLAM 0.5 MG PO TABS
0.5000 mg | ORAL_TABLET | Freq: Two times a day (BID) | ORAL | Status: DC
  Administered 2015-10-28 – 2015-10-29 (×2): 0.5 mg via ORAL
  Filled 2015-10-28 (×2): qty 1

## 2015-10-28 MED ORDER — SODIUM CHLORIDE 0.9 % IV SOLN
8.0000 mg/h | INTRAVENOUS | Status: DC
  Administered 2015-10-28: 8 mg/h via INTRAVENOUS
  Filled 2015-10-28: qty 80

## 2015-10-28 MED ORDER — LISINOPRIL 5 MG PO TABS
2.5000 mg | ORAL_TABLET | Freq: Every day | ORAL | Status: DC
  Administered 2015-10-28 – 2015-10-31 (×4): 2.5 mg via ORAL
  Filled 2015-10-28 (×4): qty 1

## 2015-10-28 MED ORDER — ACETAMINOPHEN 650 MG RE SUPP: 650.0000 mg | Freq: Four times a day (QID) | RECTAL | Status: DC | PRN

## 2015-10-28 NOTE — ED Provider Notes (Addendum)
[facility] Emergency Department Provider Note   ____________________________________________  Time seen: Seen upon arrival to the emergency department  I have reviewed the triage vital signs and the nursing notes.   HISTORY  Chief Complaint Altered Mental Status; Fall; Failure To Thrive; and Hematemesis   HPI Matthew Foley is a 82 y.o. male with a history of CAD and pancreatitis was presented to the emergency department after being found down in his apartment. Medics were called to the patient's apartment after he was not seen by the apartment managers for several days. Per EMS the patient was found on the floor with coffee ground emesis spread throughout the floor as well as on the right side of his face. The patient was also found to be laying in his own feces. The patient denies any pain right now. Denies any complaints. Was not found to be focally weak by EMS. Patient denies drinking or alcohol. He does not recall the events that led up to him being on the floor. He is able to tell me his name but thinks that it is Sunday, it is actually Monday. Attempted to call his daughter, [name], for more history of the patient but her father went straight to voicemail. I did leave a voice message with the return phone number for the hospital.   Past Medical History  Diagnosis Date  . Depression   . Hypertension   . Hyperlipidemia   . CAD (coronary artery disease)     at least 1 stent  . Hx of pancreatitis   . Rheumatoid arthritis (HCC)   . Hypothyroidism   . GERD (gastroesophageal reflux disease)     Patient Active Problem List   Diagnosis Date Noted  . Drug overdose, intentional (HCC) 12/05/2014  . Arrhythmia, ventricular 12/05/2014  . CAD (coronary artery disease) 12/05/2014  . Depression 12/05/2014  . Major depression (HCC) 12/04/2014  . Suicidal behavior 12/04/2014  . Hypothyroid 12/04/2014  . Hypertension 12/04/2014    Past Surgical  History  Procedure Laterality Date  . Knee replacement Bilateral   . Cholecystectomy    . Hernia repair    . Toe amputation      Right, 2nd toe  . Joint replacement      Current Outpatient Rx  Name  Route  Sig  Dispense  Refill  . ALPRAZolam (XANAX) 0.5 MG tablet   Oral   Take 0.5 mg by mouth 2 (two) times daily.         [name] aspirin EC 81 MG EC tablet   Oral   Take 1 tablet (81 mg total) by mouth daily.   30 tablet   0   . ferrous sulfate 325 (65 FE) MG tablet   Oral   Take 325 mg by mouth daily.         [name] ketoconazole (NIZORAL) 2 % shampoo   Topical   Apply 1 application topically once a week.         . levothyroxine (SYNTHROID, LEVOTHROID) 75 MCG tablet   Oral   Take 75 mcg by mouth daily before breakfast.         . lisinopril (PRINIVIL,ZESTRIL) 2.5 MG tablet   Oral   Take 2.5 mg by mouth daily.         . pantoprazole (PROTONIX) 40 MG tablet   Oral   Take 40 mg by mouth daily.         [name] PARoxetine (PAXIL) 20 MG tablet   Oral  Take 20 mg by mouth daily.         . potassium chloride SA (K-DUR,KLOR-CON) 20 MEQ tablet   Oral   Take 20 mEq by mouth 2 (two) times daily.         [name] rOPINIRole (REQUIP) 0.25 MG tablet   Oral   Take 0.25 mg by mouth every evening.         . tamsulosin (FLOMAX) 0.4 MG CAPS capsule   Oral   Take 0.4 mg by mouth daily.           Allergies Review of patient's allergies indicates no known allergies.  Family History  Problem Relation Age of Onset  . Hypertension Mother   . Hypertension Father   . CAD Father     Social History Social History  Substance Use Topics  . Smoking status: Former Games developer  . Smokeless tobacco: Former Neurosurgeon  . Alcohol Use: Yes    Review of Systems Constitutional: No fever/chills Eyes: No visual changes. ENT: No sore throat. Cardiovascular: Denies chest pain. Respiratory: Denies shortness of breath. Gastrointestinal: No abdominal pain.  No nausea, no vomiting.  No diarrhea.   No constipation. Genitourinary: Negative for dysuria. Musculoskeletal: Negative for back pain. Skin: Negative for rash. Neurological: Negative for headaches, focal weakness or numbness.  10-point ROS otherwise negative.  ____________________________________________   PHYSICAL EXAM:  VITAL SIGNS: ED Triage Vitals  Enc Vitals Group     BP 10/28/15 0959 153/110 mmHg     Pulse Rate 10/28/15 0959 88     Resp 10/28/15 0959 16     Temp 10/28/15 0959 97.6 F (36.4 C)     Temp Source 10/28/15 0959 Oral     SpO2 10/28/15 0959 95 %     Weight 10/28/15 0959 153 lb 14.1 oz (69.8 kg)     Height 10/28/15 0959 6\' 1"  (1.854 m)     Head Cir --      Peak Flow --      Pain Score --      Pain Loc --      Pain Edu? --      Excl. in GC? --     Constitutional: Alert and orientedTo self. Wearing c-collar. Streak of coffee ground, crusted emesis from the right side of his mouth onto the right side of his face. Eyes: Conjunctivae are normal. PERRL. EOMI. Head: Atraumatic. Nose: No congestion/rhinnorhea. Mouth/Throat: Mucous membranes are moist.  Oropharynx non-erythematous. Neck: No stridor.  Wearing cervical collar. No tenderness to the midline C-spine. No deformity or step-off. Cardiovascular: Normal rate, regular rhythm. Grossly normal heart sounds.   Respiratory: Normal respiratory effort.  No retractions. Lungs CTAB. Gastrointestinal: Soft and nontender. No distention.  No CVA tenderness. Musculoskeletal: No lower extremity tenderness nor edema.  No joint effusions. Neurologic:  Normal speech and language. 4 out of 5 strength throughout. Oriented to self. Does not recall recent events. Skin:  Small abrasions to the medial aspect of the left foot. Nontender to palpation. Mild erythema with what appears to be beginning of skin breakdown to the right posterior shoulder as well as to the sacral decubitus area. Psychiatric: Mood and affect are normal. Speech and behavior are  normal.  ____________________________________________   LABS (all labs ordered are listed, but only abnormal results are displayed)  Labs Reviewed  CBC WITH DIFFERENTIAL/PLATELET - Abnormal; Notable for the following:    WBC 17.5 (*)    Neutro Abs 15.4 (*)    Lymphs Abs 0.9 (*)  Monocytes Absolute 1.2 (*)    All other components within normal limits  COMPREHENSIVE METABOLIC PANEL - Abnormal; Notable for the following:    Potassium 3.4 (*)    Chloride 100 (*)    Glucose, Bld 173 (*)    BUN 50 (*)    Creatinine, Ser 1.41 (*)    Total Protein 8.2 (*)    AST 116 (*)    GFR calc non Af Amer 45 (*)    GFR calc Af Amer 53 (*)    Anion gap 16 (*)    All other components within normal limits  TROPONIN I - Abnormal; Notable for the following:    Troponin I 0.05 (*)    All other components within normal limits  CK - Abnormal; Notable for the following:    Total CK 3635 (*)    All other components within normal limits  PROTIME-INR  LIPASE, BLOOD  URINALYSIS COMPLETEWITH MICROSCOPIC ([facility] ONLY)  TYPE AND SCREEN  ABO/RH   ____________________________________________  EKG  ED ECG REPORT I, Eliyanna Ault,  Teena Irani, the attending physician, personally viewed and interpreted this ECG.   Date: 10/28/2015  EKG Time: 1016  Rate: 94  Rhythm: normal sinus rhythm  Axis: Normal axis  Intervals:right bundle branch block  ST&T Change: 1 mm ST depression in V3. No ST elevations. No T-wave inversions. Unclear if the depression in V3 is exaggerated by the poor baseline. Right bundle branch block is seen on previous EKGs.  ED ECG REPORT I, Arelia Longest, the attending physician, personally viewed and interpreted this ECG.   Date: 10/28/2015  EKG Time: 1025  Rate: 90  Rhythm: normal sinus rhythm  Axis: Normal  Intervals:first-degree A-V block , left bundle branch block and right bundle branch block  ST&T Change: Minimal ST depression in V3. Poor baseline in II, III, and F aVF. No  ST elevations.   ____________________________________________  RADIOLOGY DG Shoulder Right (Final result) Result time: 10/28/15 11:32:24   Final result by Rad Results In Interface (10/28/15 11:32:24)   Narrative:   CLINICAL DATA: Patient found on floor. Right shoulder pain.  EXAM: RIGHT SHOULDER - 2+ VIEW  COMPARISON: None.  FINDINGS: The right shoulder is located. No acute bone or soft tissue abnormality. A remote Bankart fracture is evident. The visualized right hemithorax is clear. The visualize right clavicle is within normal limits.  IMPRESSION: No acute abnormality.   Electronically Signed By: Marin Roberts M.D. On: 10/28/2015 11:32          DG Foot Complete Left (Final result) Result time: 10/28/15 11:30:58   Procedure changed from DG Foot 2 Views Left      Final result by Rad Results In Interface (10/28/15 11:30:58)   Narrative:   CLINICAL DATA: Pain following fall  EXAM: LEFT FOOT - COMPLETE 3+ VIEW  COMPARISON: None.  FINDINGS: Frontal, oblique, and lateral views were obtained. There is pes planus. Bones are osteoporotic. No acute fracture or dislocation is evident. Flexion deformities are noted in the MTP and PIP joint regions. There is moderate narrowing of all PIP and DIP joints as well as the first MTP joint. Advanced arthropathy at the tarsal-metatarsal junction. No erosive change or bony destruction evident.  IMPRESSION: Extensive arthropathy, most marked at the tarsal-metatarsal level. No fracture or dislocation. Pes planus.   Electronically Signed By: Bretta Bang III M.D. On: 10/28/2015 11:30          CT Head Wo Contrast (Final result) Result time: 10/28/15 11:33:37  Final result by Rad Results In Interface (10/28/15 11:33:37)   Narrative:   CLINICAL DATA: Patient found down today. Altered mental status. Initial encounter.  EXAM: CT HEAD WITHOUT CONTRAST  CT CERVICAL SPINE WITHOUT  CONTRAST  TECHNIQUE: Multidetector CT imaging of the head and cervical spine was performed following the standard protocol without intravenous contrast. Multiplanar CT image reconstructions of the cervical spine were also generated.  COMPARISON: Head CT scan 11/06/2013.  FINDINGS: CT HEAD FINDINGS  There is cortical atrophy and chronic microvascular ischemic change. No evidence of acute intracranial abnormality including hemorrhage, infarct, mass lesion, mass effect, midline shift or abnormal extra-axial fluid collection. Right mastoid effusion is seen. Left mastoid air cells and imaged paranasal sinuses are clear. The calvarium is intact.  CT CERVICAL SPINE FINDINGS  No cervical spine fracture is identified. 0.3 cm anterolisthesis C4 on C5 and trace anterolisthesis C5 on C6 is due to facet arthropathy. The C3-4 and C4-5 facet joints and disc interspaces are fused autologous leak. Lung apices are clear.  IMPRESSION: No acute abnormality head or cervical spine.  Right mastoid effusion.  Multilevel cervical spondylosis.   Electronically Signed By: Drusilla Kanner M.D. On: 10/28/2015 11:33          CT Cervical Spine Wo Contrast (Final result) Result time: 10/28/15 11:33:37   Final result by Rad Results In Interface (10/28/15 11:33:37)   Narrative:   CLINICAL DATA: Patient found down today. Altered mental status. Initial encounter.  EXAM: CT HEAD WITHOUT CONTRAST  CT CERVICAL SPINE WITHOUT CONTRAST  TECHNIQUE: Multidetector CT imaging of the head and cervical spine was performed following the standard protocol without intravenous contrast. Multiplanar CT image reconstructions of the cervical spine were also generated.  COMPARISON: Head CT scan 11/06/2013.  FINDINGS: CT HEAD FINDINGS  There is cortical atrophy and chronic microvascular ischemic change. No evidence of acute intracranial abnormality including hemorrhage, infarct, mass lesion,  mass effect, midline shift or abnormal extra-axial fluid collection. Right mastoid effusion is seen. Left mastoid air cells and imaged paranasal sinuses are clear. The calvarium is intact.  CT CERVICAL SPINE FINDINGS  No cervical spine fracture is identified. 0.3 cm anterolisthesis C4 on C5 and trace anterolisthesis C5 on C6 is due to facet arthropathy. The C3-4 and C4-5 facet joints and disc interspaces are fused autologous leak. Lung apices are clear.  IMPRESSION: No acute abnormality head or cervical spine.  Right mastoid effusion.  Multilevel cervical spondylosis.   Electronically Signed By: Drusilla Kanner M.D. On: 10/28/2015 11:33          DG Chest 1 View (Final result) Result time: 10/28/15 10:58:25   Final result by Rad Results In Interface (10/28/15 10:58:25)   Narrative:   CLINICAL DATA: Confusion/altered mental status  EXAM: CHEST 1 VIEW  COMPARISON: August 29, 2015 chest radiograph and chest CT September 02, 2015  FINDINGS: There is no edema or consolidation. The heart size and pulmonary vascularity are normal. No adenopathy. No bone lesions.  IMPRESSION: No edema or consolidation.   Electronically Signed By: Bretta Bang III M.D. On: 10/28/2015 10:58        ____________________________________________   PROCEDURES   ____________________________________________   INITIAL IMPRESSION / ASSESSMENT AND PLAN / ED COURSE  Pertinent labs & imaging results that were available during my care of the patient were reviewed by me and considered in my medical decision making (see chart for details).  ----------------------------------------- 12:27 PM on 10/28/2015 -----------------------------------------  Patient resting comfortably at this time. No further episodes of vomiting. Appears  to be in rhabdomyolysis. On Protonix drip at this time. The patient's daughters at the bedside. I explained to him as well as his daughter the  need for admission. We are still pending a urine sample. Imaging is very reassuring. Signed out to Dr. Nemiah Commander. ____________________________________________   FINAL CLINICAL IMPRESSION(S) / ED DIAGNOSES  Acute rhabdomyolysis. Mild renal failure. Acute GI bleed.    NEW MEDICATIONS STARTED DURING THIS VISIT:  New Prescriptions   No medications on file     Note:  This document was prepared using Dragon voice recognition software and may include unintentional dictation errors.    Myrna Blazer, MD 10/28/15 1229  Clinically cleared the patient's cervical spine. No tenderness to the cervical spine. Reassuring CT imaging. Ranging all 4 extremities equally.  Myrna Blazer, MD 10/28/15 [phone]

## 2015-10-28 NOTE — Progress Notes (Signed)
Prime Dr. Luberta Robertson and awaiting call back to notify of increased troponins

## 2015-10-28 NOTE — H&P (Signed)
[facility] [place] at [facility]   PATIENT NAME: Matthew Foley    MR#:  [id number]  DATE OF BIRTH:  March 28, 1933  DATE OF ADMISSION:  10/28/2015  PRIMARY CARE PHYSICIAN: Jaclyn Shaggy, MD   REQUESTING/REFERRING PHYSICIAN: Dr. Gladstone Pih  CHIEF COMPLAINT:   Chief Complaint  Patient presents with  . Altered Mental Status  . Fall  . Failure To Thrive  . Hematemesis    HISTORY OF PRESENT ILLNESS:  Matthew Foley  is a 82 y.o. male with a known history of Atrial fibrillation not on anticoagulation, CAD, history of major depression, hypertension, arthritis who lives at home by himself presents to the hospital secondary to a fall and laying on the floor for almost 2 days now. Patient is very confused at this time. Most of the history is obtained from his daughter at bedside. Patient apparently lives in an apartment complex by himself and is independent and ambulates with the help of a cane. He has had a fall may be 1-2 days ago and has been on the floor since then. His daughter has been trying to reach him but nobody was picking up the phone for 2 days now. They had to call the apartment complex people who are okay and and he was noted to be laying in his PCPs and also had coffee-ground dried-up material on his face and also neck. Patient is unable to tell if he had vomited or not. No prior history of hematemesis. No recent illnesses, no fevers or chills. CT of the head negative for any acute findings. Labs indicate rhabdomyolysis and acute renal failure. Urine analysis is pending.  PAST MEDICAL HISTORY:   Past Medical History  Diagnosis Date  . Depression     h/o psychiatric hospitalisations prior  . Hypertension   . Hyperlipidemia   . CAD (coronary artery disease)     at least 1 stent  . Hx of pancreatitis   . Rheumatoid arthritis (HCC)   . Hypothyroidism   . GERD (gastroesophageal reflux disease)   . Atrial fibrillation (HCC)     PAST SURGICAL  HISTORY:   Past Surgical History  Procedure Laterality Date  . Knee replacement Bilateral   . Cholecystectomy    . Hernia repair    . Toe amputation      Right, 2nd toe  . Joint replacement      SOCIAL HISTORY:   Social History  Substance Use Topics  . Smoking status: Former Games developer  . Smokeless tobacco: Former Neurosurgeon     Comment: smokes cigars  . Alcohol Use: 0.0 oz/week    0 Standard drinks or equivalent per week     Comment: Margarita some times    FAMILY HISTORY:   Family History  Problem Relation Age of Onset  . Hypertension Mother   . Hypertension Father   . CAD Father     DRUG ALLERGIES:  No Known Allergies  REVIEW OF SYSTEMS:   Review of Systems  Unable to perform ROS: mental acuity    MEDICATIONS AT HOME:   Prior to Admission medications   Medication Sig Start Date End Date Taking? Authorizing Provider  ALPRAZolam Prudy Feeler) 0.5 MG tablet Take 0.5 mg by mouth 2 (two) times daily.   Yes Historical Provider, MD  aspirin EC 81 MG EC tablet Take 1 tablet (81 mg total) by mouth daily. 12/06/14  Yes Altamese Dilling, MD  ferrous sulfate 325 (65 FE) MG tablet Take 325 mg by mouth daily.  Yes Historical Provider, MD  ketoconazole (NIZORAL) 2 % shampoo Apply 1 application topically once a week.   Yes Historical Provider, MD  levothyroxine (SYNTHROID, LEVOTHROID) 75 MCG tablet Take 75 mcg by mouth daily before breakfast.   Yes Historical Provider, MD  lisinopril (PRINIVIL,ZESTRIL) 2.5 MG tablet Take 2.5 mg by mouth daily.   Yes Historical Provider, MD  pantoprazole (PROTONIX) 40 MG tablet Take 40 mg by mouth daily.   Yes Historical Provider, MD  PARoxetine (PAXIL) 20 MG tablet Take 20 mg by mouth daily.   Yes Historical Provider, MD  potassium chloride SA (K-DUR,KLOR-CON) 20 MEQ tablet Take 20 mEq by mouth 2 (two) times daily.   Yes Historical Provider, MD  rOPINIRole (REQUIP) 0.25 MG tablet Take 0.25 mg by mouth every evening.   Yes Historical Provider, MD   tamsulosin (FLOMAX) 0.4 MG CAPS capsule Take 0.4 mg by mouth daily.   Yes Historical Provider, MD      VITAL SIGNS:  Blood pressure 153/110, pulse 92, temperature 97.6 F (36.4 C), temperature source Oral, resp. rate 19, height 6\' 1"  (1.854 m), weight 69.8 kg (153 lb 14.1 oz), SpO2 99 %.  PHYSICAL EXAMINATION:   Physical Exam  GENERAL:  82 y.o.-year-old ill nourished patient lying in the bed with no acute distress. dishelved appearing. EYES: Pupils equal, round, reactive to light and accommodation. No scleral icterus. Extraocular muscles intact. Milky discharged from right eye. HEENT: Head atraumatic, normocephalic. Oropharynx and nasopharynx clear.  NECK:  Supple, no jugular venous distention. No thyroid enlargement, no tenderness.  LUNGS: Normal breath sounds bilaterally, no wheezing, rales,rhonchi or crepitation. No use of accessory muscles of respiration. Decreased bibasilar breath sounds  CARDIOVASCULAR: S1, S2 normal. No  rubs, or gallops. 2/6 systolic murmur is present ABDOMEN: Soft, nontender, nondistended. Bowel sounds present. No organomegaly or mass.  EXTREMITIES: No pedal edema, cyanosis, or clubbing. Left foot is colder to touch than the right one. No numbness and normal motor function noted. Feeble dorsalis pedis pulse on the left one and good DP on the right one NEUROLOGIC: Cranial nerves II through XII are intact. Following commands. Strength seems to be 5 x 5 in all 4 extremities. Sensation is intact. Gait is not tested PSYCHIATRIC: The patient is alert but oriented 2 only. Very confused. SKIN: No obvious rash, lesion, or ulcer.   LABORATORY PANEL:   CBC  Recent Labs Lab 10/28/15 1022  WBC 17.5*  HGB 14.4  HCT 43.4  PLT 217   ------------------------------------------------------------------------------------------------------------------  Chemistries   Recent Labs Lab 10/28/15 1022  NA 142  K 3.4*  CL 100*  CO2 26  GLUCOSE 173*  BUN 50*   CREATININE 1.41*  CALCIUM 9.8  AST 116*  ALT 38  ALKPHOS 51  BILITOT 1.2   ------------------------------------------------------------------------------------------------------------------  Cardiac Enzymes  Recent Labs Lab 10/28/15 1022  TROPONINI 0.05*   ------------------------------------------------------------------------------------------------------------------  RADIOLOGY:  Dg Chest 1 View  10/28/2015  CLINICAL DATA:  Confusion/altered mental status EXAM: CHEST 1 VIEW COMPARISON:  August 29, 2015 chest radiograph and chest CT September 02, 2015 FINDINGS: There is no edema or consolidation. The heart size and pulmonary vascularity are normal. No adenopathy. No bone lesions. IMPRESSION: No edema or consolidation. Electronically Signed   By: September 04, 2015 III M.D.   On: 10/28/2015 10:58   Dg Shoulder Right  10/28/2015  CLINICAL DATA:  Patient found on floor.  Right shoulder pain. EXAM: RIGHT SHOULDER - 2+ VIEW COMPARISON:  None. FINDINGS: The right shoulder is located. No acute  bone or soft tissue abnormality. A remote Bankart fracture is evident. The visualized right hemithorax is clear. The visualize right clavicle is within normal limits. IMPRESSION: No acute abnormality. Electronically Signed   By: Marin Roberts M.D.   On: 10/28/2015 11:32   Ct Head Wo Contrast  10/28/2015  CLINICAL DATA:  Patient found down today. Altered mental status. Initial encounter. EXAM: CT HEAD WITHOUT CONTRAST CT CERVICAL SPINE WITHOUT CONTRAST TECHNIQUE: Multidetector CT imaging of the head and cervical spine was performed following the standard protocol without intravenous contrast. Multiplanar CT image reconstructions of the cervical spine were also generated. COMPARISON:  Head CT scan 11/06/2013. FINDINGS: CT HEAD FINDINGS There is cortical atrophy and chronic microvascular ischemic change. No evidence of acute intracranial abnormality including hemorrhage, infarct, mass lesion, mass effect,  midline shift or abnormal extra-axial fluid collection. Right mastoid effusion is seen. Left mastoid air cells and imaged paranasal sinuses are clear. The calvarium is intact. CT CERVICAL SPINE FINDINGS No cervical spine fracture is identified. 0.3 cm anterolisthesis C4 on C5 and trace anterolisthesis C5 on C6 is due to facet arthropathy. The C3-4 and C4-5 facet joints and disc interspaces are fused autologous leak. Lung apices are clear. IMPRESSION: No acute abnormality head or cervical spine. Right mastoid effusion. Multilevel cervical spondylosis. Electronically Signed   By: Drusilla Kanner M.D.   On: 10/28/2015 11:33   Ct Cervical Spine Wo Contrast  10/28/2015  CLINICAL DATA:  Patient found down today. Altered mental status. Initial encounter. EXAM: CT HEAD WITHOUT CONTRAST CT CERVICAL SPINE WITHOUT CONTRAST TECHNIQUE: Multidetector CT imaging of the head and cervical spine was performed following the standard protocol without intravenous contrast. Multiplanar CT image reconstructions of the cervical spine were also generated. COMPARISON:  Head CT scan 11/06/2013. FINDINGS: CT HEAD FINDINGS There is cortical atrophy and chronic microvascular ischemic change. No evidence of acute intracranial abnormality including hemorrhage, infarct, mass lesion, mass effect, midline shift or abnormal extra-axial fluid collection. Right mastoid effusion is seen. Left mastoid air cells and imaged paranasal sinuses are clear. The calvarium is intact. CT CERVICAL SPINE FINDINGS No cervical spine fracture is identified. 0.3 cm anterolisthesis C4 on C5 and trace anterolisthesis C5 on C6 is due to facet arthropathy. The C3-4 and C4-5 facet joints and disc interspaces are fused autologous leak. Lung apices are clear. IMPRESSION: No acute abnormality head or cervical spine. Right mastoid effusion. Multilevel cervical spondylosis. Electronically Signed   By: Drusilla Kanner M.D.   On: 10/28/2015 11:33   Dg Foot Complete  Left  10/28/2015  CLINICAL DATA:  Pain following fall EXAM: LEFT FOOT - COMPLETE 3+ VIEW COMPARISON:  None. FINDINGS: Frontal, oblique, and lateral views were obtained. There is pes planus. Bones are osteoporotic. No acute fracture or dislocation is evident. Flexion deformities are noted in the MTP and PIP joint regions. There is moderate narrowing of all PIP and DIP joints as well as the first MTP joint. Advanced arthropathy at the tarsal-metatarsal junction. No erosive change or bony destruction evident. IMPRESSION: Extensive arthropathy, most marked at the tarsal-metatarsal level. No fracture or dislocation. Pes planus. Electronically Signed   By: Bretta Bang III M.D.   On: 10/28/2015 11:30    EKG:   Orders placed or performed during the hospital encounter of 10/28/15  . ED EKG  . ED EKG    IMPRESSION AND PLAN:   [place] Galey  is a 82 y.o. male with a known history of Atrial fibrillation not on anticoagulation, CAD, history of major  depression, hypertension, arthritis who lives at home by himself presents to the hospital secondary to a fall and laying on the floor for almost 2 days now.  #1 acute metabolic encephalopathy-could be from renal failure and rhabdomyolysis and dehydration. -Check urinalysis to rule out infection. -CT of the head with no acute findings. Continue fluids and if the UA is positive, send for cultures and start antibiotics.  #2 rhabdomyolysis-CK greater than 3000. Gentle hydration and monitor CK levels  #3 acute renal failure-secondary to rhabdomyolysis. IV hydration and monitor renal function.  #4 hypokalemia-being replaced.  #5 history of atrial fibrillation- follows with cardiology. Replace electrolytes. -Continue amiodarone. -Aspirin on hold.  #6 hematemesis-known history of GERD. Hemoglobin is stable now. -Check hemoglobin every 8 hours. Hold aspirin. -GI consulted. Started on liquid diet -Continue IV Protonix  #7 severe protein calorie  malnutrition- being monitored as outpatient. Dietary consult placed  #8 BPH-continue Flomax  #9 DVT prophylaxis-on Lovenox   Physical therapy consult    All the records are reviewed and case discussed with ED provider. Management plans discussed with the patient, family and they are in agreement.  CODE STATUS: Full Code  TOTAL TIME TAKING CARE OF THIS PATIENT: 50 minutes.    Enid Baas M.D on 10/28/2015 at 1:05 PM  Between 7am to 6pm - Pager - [phone]  After 6pm go to [email or web address] - password [email or web address] [facility]  [facility]  Office  [phone]  CC: Primary care physician; Jaclyn Shaggy, MD

## 2015-10-28 NOTE — ED Notes (Signed)
Patient presents to the ED via [facility] from apartment.  EMS was called by patient's apartment Production designer, theatre/television/film.  Patient's daughter has been attempting to call patient all weekend with no answer.  Apartment manager went to check on patient and saw patient lying in the floor in own emesis and stool and called 911.  Patient is alert and oriented to self but appears very confused.  Patient is thin and lower legs and purple and skin is flaking.  Left foot is bleeding slightly.  Patient's daughter can be contacted at [phone]

## 2015-10-28 NOTE — Evaluation (Signed)
Physical Therapy Evaluation Patient Details Name: Matthew Foley MRN: [id number] DOB: [birth year] Today's Date: 10/28/2015   History of Present Illness  Matthew Foley  is a 82 y.o. male with a known history of Atrial fibrillation not on anticoagulation, CAD, history of major depression, hypertension, arthritis who lives at home by himself presents to the hospital secondary to a fall and laying on the floor for almost 2 days now. Patient is very confused at this time. Most of the history is obtained from his daughter at bedside. Patient apparently lives in an apartment complex by himself and is independent and ambulates with the help of a cane. He has had a fall may be 1-2 days ago and has been on the floor since then. His daughter has been trying to reach him but nobody was picking up the phone for 2 days now. They had to call the apartment complex people and he was noted to be laying in his feces and also had coffee-ground dried-up material on his face and also neck. Patient is unable to tell if he had vomited or not. No prior history of hematemesis. No recent illnesses, no fevers or chills. CT of the head negative for any acute findings. Labs indicate rhabdomyolysis and acute renal failure. Urine analysis is pending. Pt is AOx2 at time of PT evaluation. Denies other falls in the last 12 months. History does not appear to be accurate and no family present to supplement during PT evaluation.   Clinical Impression  Pt is disoriented to situation and time during PT evaluation. His history does not appear accurate so most of history obtained from record. No family or caregiver present to supplement. Pt is exceedingly weak during PT evaluation. He requires assistance for bed mobility and transfers. He requires 3 attempts and modA+1 for sit to stand transfers and even once upright is unable to stay standing longer than 5 seconds due to being weak, unstable, and tremulous. Pt unable to ambulate at this time.  He is grossly weak compared to baseline and will need SNF placement at discharge in order to return to prior level of function at home.     Follow Up Recommendations SNF    Equipment Recommendations  Other (comment) (TBD at SNF, difficult to assess due to cognition)    Recommendations for Other Services       Precautions / Restrictions Precautions Precautions: Fall Restrictions Weight Bearing Restrictions: No      Mobility  Bed Mobility Overal bed mobility: Needs Assistance Bed Mobility: Supine to Sit;Sit to Supine     Supine to sit: Min assist Sit to supine: Min assist   General bed mobility comments: Pt requires assist for both phases of bed mobility. Able to roll without assistance. Needs foot of bed elevated and cues to scoot up toward [facility]  Transfers Overall transfer level: Needs assistance Equipment used: Rolling walker (2 wheeled) Transfers: Sit to/from Stand Sit to Stand: Mod assist         General transfer comment: Pt requires 3 attempts to come to standing due to weakness and balance deficits. During third attempt is only able to stay standing 3-5 seconds before he loses his balance and falls back onto bed. Pt very tremulous and weak during attempts. Easily fatigued however Sao2 remains >95% on room air  Ambulation/Gait             General Gait Details: Unable to truly ambualte at this time  Stairs  Wheelchair Mobility    Modified Rankin (Stroke Patients Only)       Balance Overall balance assessment: Needs assistance Sitting-balance support: No upper extremity supported Sitting balance-Leahy Scale: Fair     Standing balance support: Bilateral upper extremity supported Standing balance-Leahy Scale: Poor Standing balance comment: Pt with very poor static standing balance. Unable to remain standing even with bilateral UE support due to severe LE weakness and balance deficits                             Pertinent  Vitals/Pain Pain Assessment: No/denies pain    Home Living Family/patient expects to be discharged to:: Unsure                 Additional Comments: Pt reports his lives in an apartment building with 1 flight of stairs to reach apartment. He states that he owns a BSC, walker, and single point cane. Also has a walk-in shower with a seat and grab bars. States he does not own a hospital bed. Pt reports he lives with a "lady friend" but chart indicates he lives alone    Prior Function Level of Independence: Needs assistance   Gait / Transfers Assistance Needed: Ambulates with single point cane.  ADL's / Homemaking Assistance Needed: Reports independence with ADLs but assist for IADLs. Unclear if accurate        Hand Dominance   Dominant Hand: Right    Extremity/Trunk Assessment   Upper Extremity Assessment: Generalized weakness           Lower Extremity Assessment: Generalized weakness         Communication   Communication: No difficulties  Cognition Arousal/Alertness: Awake/alert Behavior During Therapy: WFL for tasks assessed/performed Overall Cognitive Status: No family/caregiver present to determine baseline cognitive functioning Area of Impairment: Orientation Orientation Level: Disoriented to;Time;Situation   Memory: Decreased short-term memory              General Comments      Exercises        Assessment/Plan    PT Assessment Patient needs continued PT services  PT Diagnosis Difficulty walking;Abnormality of gait;Generalized weakness   PT Problem List Decreased strength;Decreased activity tolerance;Decreased balance;Decreased mobility;Decreased cognition;Decreased knowledge of use of DME;Decreased knowledge of precautions;Decreased safety awareness  PT Treatment Interventions DME instruction;Gait training;Stair training;Functional mobility training;Therapeutic activities;Therapeutic exercise;Balance training;Neuromuscular re-education;Cognitive  remediation;Patient/family education   PT Goals (Current goals can be found in the Care Plan section) Acute Rehab PT Goals Patient Stated Goal: Return to prior function at home PT Goal Formulation: With patient Time For Goal Achievement: 11/11/15 Potential to Achieve Goals: Good    Frequency Min 2X/week   Barriers to discharge Decreased caregiver support Per medical record pt lives alone    Co-evaluation               End of Session Equipment Utilized During Treatment: Gait belt;Oxygen Activity Tolerance: Patient limited by fatigue Patient left: in bed;with call bell/phone within reach;with bed alarm set           Time: 9892-1194 PT Time Calculation (min) (ACUTE ONLY): 16 min   Charges:   PT Evaluation $PT Eval Moderate Complexity: 1 Procedure     PT G Codes:       Sharalyn Ink Anapaula Severt PT, DPT   Karlye Ihrig 10/28/2015, 5:26 PM

## 2015-10-29 ENCOUNTER — Inpatient Hospital Stay
Admit: 2015-10-29 | Discharge: 2015-10-29 | Disposition: A | Discharge status: Home / Self Care | Payer: Medicare PPO | Attending: Cardiovascular Disease | Admitting: Cardiovascular Disease

## 2015-10-29 DIAGNOSIS — L8990 Pressure ulcer of unspecified site, unspecified stage: Secondary | ICD-10-CM | POA: Insufficient documentation

## 2015-10-29 DIAGNOSIS — F332 Major depressive disorder, recurrent severe without psychotic features: Secondary | ICD-10-CM

## 2015-10-29 DIAGNOSIS — R410 Disorientation, unspecified: Secondary | ICD-10-CM

## 2015-10-29 DIAGNOSIS — E43 Unspecified severe protein-calorie malnutrition: Secondary | ICD-10-CM | POA: Insufficient documentation

## 2015-10-29 DIAGNOSIS — A419 Sepsis, unspecified organism: Secondary | ICD-10-CM

## 2015-10-29 DIAGNOSIS — I4891 Unspecified atrial fibrillation: Secondary | ICD-10-CM

## 2015-10-29 LAB — ECHOCARDIOGRAM COMPLETE
HEIGHTINCHES: 73 in
Weight: 2462.1 oz

## 2015-10-29 LAB — CBC
HEMATOCRIT: 39.9 % — AB (ref 40.0–52.0)
Hemoglobin: 13.4 g/dL (ref 13.0–18.0)
MCH: 32.4 pg (ref 26.0–34.0)
MCHC: 33.6 g/dL (ref 32.0–36.0)
MCV: 96.5 fL (ref 80.0–100.0)
PLATELETS: 191 10*3/uL (ref 150–440)
RBC: 4.14 MIL/uL — AB (ref 4.40–5.90)
RDW: 14.8 % — ABNORMAL HIGH (ref 11.5–14.5)
WBC: 10.2 10*3/uL (ref 3.8–10.6)

## 2015-10-29 LAB — TROPONIN I: TROPONIN I: 0.1 ng/mL — AB (ref <0.031)

## 2015-10-29 LAB — HEMOGLOBIN: Hemoglobin: 13.4 g/dL (ref 13.0–18.0)

## 2015-10-29 LAB — BASIC METABOLIC PANEL
Anion gap: 11 (ref 5–15)
BUN: 59 mg/dL — ABNORMAL HIGH (ref 6–20)
CHLORIDE: 107 mmol/L (ref 101–111)
CO2: 26 mmol/L (ref 22–32)
CREATININE: 1.43 mg/dL — AB (ref 0.61–1.24)
Calcium: 9.3 mg/dL (ref 8.9–10.3)
GFR calc non Af Amer: 45 mL/min — ABNORMAL LOW (ref >60)
GFR, EST AFRICAN AMERICAN: 52 mL/min — AB (ref >60)
Glucose, Bld: 145 mg/dL — ABNORMAL HIGH (ref 65–99)
POTASSIUM: 4 mmol/L (ref 3.5–5.1)
Sodium: 144 mmol/L (ref 135–145)

## 2015-10-29 LAB — CK: Total CK: 1695 U/L — ABNORMAL HIGH (ref 49–397)

## 2015-10-29 MED ORDER — BOOST / RESOURCE BREEZE PO LIQD
1.0000 | Freq: Three times a day (TID) | ORAL | Status: DC
  Administered 2015-10-29 – 2015-10-30 (×5): 1 via ORAL

## 2015-10-29 MED ORDER — LEVOTHYROXINE SODIUM 25 MCG PO TABS
25.0000 ug | ORAL_TABLET | Freq: Once | ORAL | Status: AC
  Administered 2015-10-29: 25 ug via ORAL
  Filled 2015-10-29: qty 1

## 2015-10-29 MED ORDER — CHLORPROMAZINE HCL 10 MG PO TABS
10.0000 mg | ORAL_TABLET | Freq: Three times a day (TID) | ORAL | Status: DC | PRN
Start: 2015-10-29 — End: 2015-10-31
  Administered 2015-10-29 – 2015-10-30 (×2): 10 mg via ORAL
  Filled 2015-10-29 (×3): qty 1

## 2015-10-29 MED ORDER — PAROXETINE HCL 20 MG PO TABS
10.0000 mg | ORAL_TABLET | Freq: Every day | ORAL | Status: DC
  Administered 2015-10-30: 10 mg via ORAL
  Filled 2015-10-29: qty 1

## 2015-10-29 MED ORDER — ADULT MULTIVITAMIN W/MINERALS CH
1.0000 | ORAL_TABLET | Freq: Every day | ORAL | Status: DC
  Administered 2015-10-29 – 2015-10-31 (×3): 1 via ORAL
  Filled 2015-10-29 (×3): qty 1

## 2015-10-29 MED ORDER — PANTOPRAZOLE SODIUM 40 MG PO TBEC
40.0000 mg | DELAYED_RELEASE_TABLET | Freq: Every day | ORAL | Status: DC
  Administered 2015-10-29 – 2015-10-31 (×3): 40 mg via ORAL
  Filled 2015-10-29 (×3): qty 1

## 2015-10-29 MED ORDER — LEVOTHYROXINE SODIUM 100 MCG PO TABS
100.0000 ug | ORAL_TABLET | Freq: Every day | ORAL | Status: DC
  Administered 2015-10-30 – 2015-10-31 (×2): 100 ug via ORAL
  Filled 2015-10-29 (×2): qty 1

## 2015-10-29 MED ORDER — ALPRAZOLAM 0.25 MG PO TABS
0.1250 mg | ORAL_TABLET | Freq: Two times a day (BID) | ORAL | Status: DC
  Administered 2015-10-29 – 2015-10-31 (×5): 0.125 mg via ORAL
  Filled 2015-10-29 (×5): qty 1

## 2015-10-29 MED ORDER — ESCITALOPRAM OXALATE 10 MG PO TABS
10.0000 mg | ORAL_TABLET | Freq: Every day | ORAL | Status: DC
  Administered 2015-10-29 – 2015-10-31 (×3): 10 mg via ORAL
  Filled 2015-10-29 (×3): qty 1

## 2015-10-29 NOTE — Progress Notes (Signed)
*  PRELIMINARY RESULTS* Echocardiogram 2D Echocardiogram has been performed.  Georgann Housekeeper Hege 10/29/2015, 1:42 PM

## 2015-10-29 NOTE — Clinical Social Work Note (Signed)
Clinical Social Work Assessment  Patient Details  Name: Matthew Foley MRN: [id number] Date of Birth: 18-Jul-1932  Date of referral:  10/29/15               Reason for consult:  Facility Placement                Permission sought to share information with:   (Patient disoriented) Permission granted to share information::     Name::        Agency::     Relationship::     Contact Information:     Housing/Transportation Living arrangements for the past 2 months:  Single Family Home Source of Information:  Adult Children Patient Interpreter Needed:  None Criminal Activity/Legal Involvement Pertinent to Current Situation/Hospitalization:  No - Comment as needed Significant Relationships:  Adult Children Lives with:  Self Do you feel safe going back to the place where you live?    Need for family participation in patient care:  Yes (Comment)  Care giving concerns:  Patient lives alone.   Social Worker assessment / plan:  PT has recommended STR for patient. Patient currently disoriented so CSW contacted patient's daughter: Matthew Foley: [phone]. Ms. Regino Bellow stated that patient does live alone but family lives in the area. Ms. Regino Bellow stated that patient's baseline is independent and he drives and cares for himself. Ms. Regino Bellow stated that patient has been at [facility] and he had a bad experience. Ms. Regino Bellow would like rehab for her father and is in agreement with a bedsearch.   Employment status:  Retired Database administrator PT Recommendations:  Skilled Nursing Facility Information / Referral to community resources:  Skilled Nursing Facility  Patient/Family's Response to care:  Patient's daughter expressed appreciation for CSW assistance.  Patient/Family's Understanding of and Emotional Response to Diagnosis, Current Treatment, and Prognosis:  Patient is currently disoriented but family is aware and in agreement with STR.  Emotional  Assessment Appearance:  Appears stated age Attitude/Demeanor/Rapport:  Unable to Assess Affect (typically observed):  Unable to Assess Orientation:  Oriented to Self Alcohol / Substance use:  Not Applicable Psych involvement (Current and /or in the community):  No (Comment)  Discharge Needs  Concerns to be addressed:  Care Coordination Readmission within the last 30 days:  No Current discharge risk:  None Barriers to Discharge:  No Barriers Identified   [name], LCSW 10/29/2015, 2:30 PM

## 2015-10-29 NOTE — [place] FL2 (Signed)
[place] MEDICAID FL2 LEVEL OF CARE SCREENING TOOL     IDENTIFICATION  Patient Name: Matthew Foley Birthdate: 06/24/1932 Sex: male Admission Date (Current Location): 10/28/2015  County and Medicaid Number:  [place]   Facility and Address:  [facility], [address], [place], [place] [address]      Provider Number: [id number]  Attending Physician Name and Address:  Sona Patel, MD  Relative Name and Phone Number:       Current Level of Care: Hospital Recommended Level of Care: Skilled Nursing Facility Prior Approval Number:    Date Approved/Denied:   PASRR Number: [id number]  Discharge Plan: SNF    Current Diagnoses: Patient Active Problem List   Diagnosis Date Noted  . Pressure ulcer 10/29/2015  . Protein-calorie malnutrition, severe 10/29/2015  . Rhabdomyolysis 10/28/2015  . Drug overdose, intentional (HCC) 12/05/2014  . Arrhythmia, ventricular 12/05/2014  . CAD (coronary artery disease) 12/05/2014  . Depression 12/05/2014  . Major depression (HCC) 12/04/2014  . Suicidal behavior 12/04/2014  . Hypothyroid 12/04/2014  . Hypertension 12/04/2014    Orientation RESPIRATION BLADDER Height & Weight     Self, Place, Situation  Normal Incontinent Weight: 153 lb 14.1 oz (69.8 kg) Height:  6' 1" (185.4 cm)  BEHAVIORAL SYMPTOMS/MOOD NEUROLOGICAL BOWEL NUTRITION STATUS   (none)  (none) Incontinent Diet (clear liquid advance as tolerated)  AMBULATORY STATUS COMMUNICATION OF NEEDS Skin   Extensive Assist Verbally PU Stage and Appropriate Care                       Personal Care Assistance Level of Assistance              Functional Limitations Info  Sight, Hearing Sight Info: Impaired Hearing Info: Impaired      SPECIAL CARE FACTORS FREQUENCY  PT (By licensed PT)                    Contractures Contractures Info: Not present    Additional Factors Info  Code Status, Allergies Code Status Info:  Full Allergies Info: NKA           Current Medications (10/29/2015):  This is the current hospital active medication list Current Facility-Administered Medications  Medication Dose Route Frequency Provider Last Rate Last Dose  . 0.9 % NaCl with KCl 20 mEq/ L  infusion   Intravenous Continuous Radhika Kalisetti, MD 75 mL/hr at 10/29/15 0343    . acetaminophen (TYLENOL) tablet 650 mg  650 mg Oral Q6H PRN Radhika Kalisetti, MD       Or  . acetaminophen (TYLENOL) suppository 650 mg  650 mg Rectal Q6H PRN Radhika Kalisetti, MD      . ALPRAZolam (XANAX) tablet 0.5 mg  0.5 mg Oral BID Radhika Kalisetti, MD   0.5 mg at 10/29/15 1131  . amiodarone (PACERONE) tablet 200 mg  200 mg Oral Daily Radhika Kalisetti, MD   200 mg at 10/29/15 1130  . docusate sodium (COLACE) capsule 100 mg  100 mg Oral BID Radhika Kalisetti, MD   100 mg at 10/29/15 1130  . enoxaparin (LOVENOX) injection 40 mg  40 mg Subcutaneous Q24H Radhika Kalisetti, MD   40 mg at 10/28/15 1617  . feeding supplement (BOOST / RESOURCE BREEZE) liquid 1 Container  1 Container Oral TID BM Radhika Kalisetti, MD   1 Container at 10/29/15 1134  . ferrous sulfate tablet 325 mg  325 mg Oral Daily Radhika Kalisetti, MD   325 mg at   10/29/15 1131  . [START ON 10/30/2015] levothyroxine (SYNTHROID, LEVOTHROID) tablet 100 mcg  100 mcg Oral QAC breakfast Enedina Finner, MD      . lisinopril (PRINIVIL,ZESTRIL) tablet 2.5 mg  2.5 mg Oral Daily Enid Baas, MD   2.5 mg at 10/29/15 1131  . multivitamin with minerals tablet 1 tablet  1 tablet Oral Daily Enid Baas, MD   1 tablet at 10/29/15 1130  . ondansetron (ZOFRAN) tablet 4 mg  4 mg Oral Q6H PRN Enid Baas, MD       Or  . ondansetron (ZOFRAN) injection 4 mg  4 mg Intravenous Q6H PRN Enid Baas, MD   4 mg at 10/29/15 1139  . pantoprazole (PROTONIX) EC tablet 40 mg  40 mg Oral Daily Enedina Finner, MD   40 mg at 10/29/15 1130  . PARoxetine (PAXIL) tablet 20 mg  20 mg Oral Daily Enid Baas, MD   20 mg at 10/29/15 1131  . rOPINIRole (REQUIP) tablet 0.25 mg  0.25 mg Oral QPM Enid Baas, MD   0.25 mg at 10/28/15 1850  . sodium chloride flush (NS) 0.9 % injection 3 mL  3 mL Intravenous Q12H Enid Baas, MD   3 mL at 10/29/15 1000  . tamsulosin (FLOMAX) capsule 0.4 mg  0.4 mg Oral Daily Enid Baas, MD   0.4 mg at 10/29/15 1131     Discharge Medications: Please see discharge summary for a list of discharge medications.  Relevant Imaging Results:  Relevant Lab Results:   Additional Information SS: [id number]  [name], LCSW

## 2015-10-29 NOTE — Progress Notes (Signed)
Initial Nutrition Assessment  DOCUMENTATION CODES:   Severe malnutrition in context of social or environmental circumstances  INTERVENTION:  -Monitor diet progression and intake -Recommend boost breeze TID for added nutrition -Recommend adding MVI  NUTRITION DIAGNOSIS:   Malnutrition related to social / environmental circumstances as evidenced by severe depletion of body fat, severe depletion of muscle mass, moderate depletion of body fat, moderate depletions of muscle mass, percent weight loss.    GOAL:   Patient will meet greater than or equal to 90% of their needs    MONITOR:   PO intake, Supplement acceptance, Diet advancement  REASON FOR ASSESSMENT:   Consult Poor PO  ASSESSMENT:   82 y/o male admitted with AMS, fall, elevated troponin, ARF, rhabdomyolysis, hematemesis.   Noted pt found down with coffee ground emesis on face and neck and in feces.  Past Medical History  Diagnosis Date  . Depression     h/o psychiatric hospitalisations prior  . Hypertension   . Hyperlipidemia   . CAD (coronary artery disease)     at least 1 stent  . Hx of pancreatitis   . Rheumatoid arthritis (HCC)   . Hypothyroidism   . GERD (gastroesophageal reflux disease)   . Atrial fibrillation (HCC)    Medications reviewed colace, Fe sulfate, protonix, NS with KCL at 33ml/hr  Labs reviewed BUN 59, creatinine 1.43, glucose 145  Nutrition-Focused physical exam completed. Findings are moderate to severe fat depletion, moderate to severe muscle depletion, and mild edema.   Pt ate jello, drank juice this am. Reports "I eat good." "My dtr buys groceries for me."  Typically eats salads, sandwiches, soups, pudding, yogurt per pt and fruit. Pt then goes on to say appetite has not been good since wife has died and following hospital admission at [facility].  Noted this occurred in June of 2015 (ERCP)  Diet Order:  Diet clear liquid Room service appropriate?: Yes; Fluid consistency:: Thin  Skin:    (noted stage II and unstageable pressure ulcer on buttocks)  Last BM:  5/8  Height:   Ht Readings from Last 1 Encounters:  10/28/15 6\' 1"  (1.854 m)    Weight: 8% wt loss in the last 2 months per wt encounters  Wt Readings from Last 1 Encounters:  10/28/15 153 lb 14.1 oz (69.8 kg)    Ideal Body Weight:     BMI:  Body mass index is 20.31 kg/(m^2).  Estimated Nutritional Needs:   Kcal:  12/28/15 kcals/d.   Protein:  103-120 g/d  Fluid:  >/=2 L/d  EDUCATION NEEDS:   No education needs identified at this time  Wendelin Reader B. [id number], RD, LDN [phone] (pager) Weekend/On-Call pager [phone])

## 2015-10-29 NOTE — Consult Note (Signed)
[facility] Consult   Reason for Consult:  Consult for 82 year old man with a history of depression who was in the hospital recovering after a fall that resulted in him being without food or drink probably for at least a day Referring Physician:  Posey Pronto Patient Identification: Matthew Foley MRN:  [id number] Principal Diagnosis: Severe recurrent major depression without psychotic features [facility]) Diagnosis:   Patient Active Problem List   Diagnosis Date Noted  . Pressure ulcer [L89.90] 10/29/2015  . Protein-calorie malnutrition, severe [E43] 10/29/2015  . Acute delirium [R41.0] 10/29/2015  . Severe recurrent major depression without psychotic features ([place]) [F33.2] 10/29/2015  . Rhabdomyolysis [M62.82] 10/28/2015  . Drug overdose, intentional ([place]) [T50.902A] 12/05/2014  . Arrhythmia, ventricular [I49.9] 12/05/2014  . CAD (coronary artery disease) [I25.10] 12/05/2014  . Depression [F32.9] 12/05/2014  . Major depression ([place]) [F32.9] 12/04/2014  . Suicidal behavior [F48.9] 12/04/2014  . Hypothyroid [E03.9] 12/04/2014  . Hypertension [I10] 12/04/2014    Total Time spent with patient: 1 hour  Subjective:   Matthew Foley is a 82 y.o. male patient admitted with "I feel like I had a good day today".  HPI:  Patient interviewed. Chart reviewed. Labs and vitals reviewed. I also spoke with his daughter on the telephone. Patient was not a very clear historian and his daughter provided more useful information. According to the daughter she had tried to reach him by telephone over the weekend starting Saturday. When she was not able to reach him Saturday or Sunday she eventually prevailed on the manager at his apartment complex to check on him. Monday morning they found him on the floor at his kitchen. He was brought to the emergency room and appeared to be dehydrated and malnourished. On interview today the patient is not very coherent. During a spell of nearly sitting he  told me that he had slipped down out of his reclining chair onto his butt and then was unable to pull himself back up. This isn't really very consistent with his being found on the floor of the kitchen. He says that he knows that he didn't fall but can't tell me why. Patient says he thinks he was only down for about 2 hours although it seems much more likely that it was more than a day. He does admit that his mood continues to stay depressed most of the time. He admits that he still has thoughts about wishing he were dead but has not had any plan or intention of acting on it. He denies having any hallucinations now or recently. Patient is somewhat evasive about his medical health and self-care. He won't get a straight answer about how much he is eating. It appears to me that he has lost between 10 and 15 pounds just in the last couple months from what I can see in the chart. He looks like he is not eating very well. He is currently living independently. His daughters try and check up on him regularly and set his medication out. Patient denied to me that he had been using any alcohol or drugs. He does have a past history of alcohol abuse and no alcohol level was performed on admission and unfortunately. Daughter reports that she had not heard of maintaining recent comments indicating that he was planning to kill himself although she knows that he stays depressed all the time.  Social history: Patient lives by himself. His wife died several years ago probably about 4 years ago now. He was living with  his daughter for a while but now is living independently. Has been depressed pretty much ever since his wife died although it sounds like he also had serious depression before that. Currently seems to have little activity and his only contact is with his daughters.  Medical history: History of atrial fibrillation, hypothyroidism, hypertension, currently having rhabdomyolysis from his fall as a pressure ulcer and also  is malnourished.  Substance abuse history: Past history of alcohol abuse. Alcohol played a role in his last suicide attempt but he denies that he is been drinking recently.  Past Psychiatric History: Patient has a long history of recurrent depression and actually has a history of a suicide attempt that occurred even before his wife died. He had another suicide attempt just last year when he took all of his Paxil once. He is denying that he overdosed or misused any of his medicine or did anything to try and hurt himself this time. He has been on antidepressant medicine for years. He used to see a psychiatrist in the community until she no longer take his insurance. Since then he has been getting his psychiatric medicine from Dr. Juanell Fairly primary care doctor. For the last couple years at least he has been on Paxil 20 mg a day. He is currently taking Xanax 0.5 mg twice a day a dose which seems to have crept up over the last year or 2. We have some documentation that he was treated with citalopram in the past he can't remember any other medicines.  Risk to Self: Is patient at risk for suicide?: No Risk to Others:   Prior Inpatient Therapy:   Prior Outpatient Therapy:    Past Medical History:  Past Medical History  Diagnosis Date  . Depression     h/o psychiatric hospitalisations prior  . Hypertension   . Hyperlipidemia   . CAD (coronary artery disease)     at least 1 stent  . Hx of pancreatitis   . Rheumatoid arthritis ([place])   . Hypothyroidism   . GERD (gastroesophageal reflux disease)   . Atrial fibrillation [facility])     Past Surgical History  Procedure Laterality Date  . Knee replacement Bilateral   . Cholecystectomy    . Hernia repair    . Toe amputation      Right, 2nd toe  . Joint replacement     Family History:  Family History  Problem Relation Age of Onset  . Hypertension Mother   . Hypertension Father   . CAD Father    Family Psychiatric  History: Positive for  depression Social History:  History  Alcohol Use  . 0.0 oz/week  . 0 Standard drinks or equivalent per week    Comment: Margarita some times     History  Drug Use No    Social History   Social History  . Marital Status: Widowed    Spouse Name: N/A  . Number of Children: N/A  . Years of Education: N/A   Social History Main Topics  . Smoking status: Former Research scientist (life sciences)  . Smokeless tobacco: Former Systems developer     Comment: smokes cigars  . Alcohol Use: 0.0 oz/week    0 Standard drinks or equivalent per week     Comment: Margarita some times  . Drug Use: No  . Sexual Activity: Not Asked   Other Topics Concern  . None   Social History Narrative   Stays in an apartment by himself. Ambulates with a cane.   Additional Social History:  Allergies:  No Known Allergies  Labs:  Results for orders placed or performed during the hospital encounter of 10/28/15 (from the past 48 hour(s))  CBC with Differential     Status: Abnormal   Collection Time: 10/28/15 10:22 AM  Result Value Ref Range   WBC 17.5 (H) 3.8 - 10.6 K/uL   RBC 4.47 4.40 - 5.90 MIL/uL   Hemoglobin 14.4 13.0 - 18.0 g/dL   HCT 43.4 40.0 - 52.0 %   MCV 97.0 80.0 - 100.0 fL   MCH 32.2 26.0 - 34.0 pg   MCHC 33.2 32.0 - 36.0 g/dL   RDW 14.5 11.5 - 14.5 %   Platelets 217 150 - 440 K/uL   Neutrophils Relative % 88% %   Neutro Abs 15.4 (H) 1.4 - 6.5 K/uL   Lymphocytes Relative 5% %   Lymphs Abs 0.9 (L) 1.0 - 3.6 K/uL   Monocytes Relative 7% %   Monocytes Absolute 1.2 (H) 0.2 - 1.0 K/uL   Eosinophils Relative 0% %   Eosinophils Absolute 0.0 0 - 0.7 K/uL   Basophils Relative 0% %   Basophils Absolute 0.0 0 - 0.1 K/uL  Comprehensive metabolic panel     Status: Abnormal   Collection Time: 10/28/15 10:22 AM  Result Value Ref Range   Sodium 142 135 - 145 mmol/L   Potassium 3.4 (L) 3.5 - 5.1 mmol/L   Chloride 100 (L) 101 - 111 mmol/L   CO2 26 22 - 32 mmol/L   Glucose, Bld 173 (H) 65 - 99 mg/dL   BUN 50 (H) 6 - 20 mg/dL    Creatinine, Ser 1.41 (H) 0.61 - 1.24 mg/dL   Calcium 9.8 8.9 - 10.3 mg/dL   Total Protein 8.2 (H) 6.5 - 8.1 g/dL   Albumin 3.9 3.5 - 5.0 g/dL   AST 116 (H) 15 - 41 U/L   ALT 38 17 - 63 U/L   Alkaline Phosphatase 51 38 - 126 U/L   Total Bilirubin 1.2 0.3 - 1.2 mg/dL   GFR calc non Af Amer 45 (L) >60 mL/min   GFR calc Af Amer 53 (L) >60 mL/min    Comment: (NOTE) The eGFR has been calculated using the CKD EPI equation. This calculation has not been validated in all clinical situations. eGFR's persistently <60 mL/min signify possible Chronic Kidney Disease.    Anion gap 16 (H) 5 - 15  Protime-INR     Status: None   Collection Time: 10/28/15 10:22 AM  Result Value Ref Range   Prothrombin Time 13.7 11.4 - 15.0 seconds   INR 1.03   Troponin I     Status: Abnormal   Collection Time: 10/28/15 10:22 AM  Result Value Ref Range   Troponin I 0.05 (H) <0.031 ng/mL    Comment: READ BACK AND VERIFIED WITH ANNA HOLT AT 4098 10/28/15 DAS        PERSISTENTLY INCREASED TROPONIN VALUES IN THE RANGE OF 0.04-0.49 ng/mL CAN BE SEEN IN:       -UNSTABLE ANGINA       -CONGESTIVE HEART FAILURE       -MYOCARDITIS       -CHEST TRAUMA       -ARRYHTHMIAS       -LATE PRESENTING MYOCARDIAL INFARCTION       -COPD   CLINICAL FOLLOW-UP RECOMMENDED.   Type and screen [place]     Status: None   Collection Time: 10/28/15 10:22 AM  Result Value Ref Range  ABO/RH(D) O POS    Antibody Screen NEG    Sample Expiration 10/31/2015   CK     Status: Abnormal   Collection Time: 10/28/15 10:22 AM  Result Value Ref Range   Total CK 3635 (H) 49 - 397 U/L  Lipase, blood     Status: None   Collection Time: 10/28/15 10:22 AM  Result Value Ref Range   Lipase 18 11 - 51 U/L  ABO/Rh     Status: None   Collection Time: 10/28/15 10:23 AM  Result Value Ref Range   ABO/RH(D) O POS   Urinalysis complete, with microscopic ([facility] only)     Status: Abnormal   Collection Time: 10/28/15  1:19 PM   Result Value Ref Range   Color, Urine YELLOW (A) YELLOW   APPearance CLEAR (A) CLEAR   Glucose, UA NEGATIVE NEGATIVE mg/dL   Bilirubin Urine NEGATIVE NEGATIVE   Ketones, ur NEGATIVE NEGATIVE mg/dL   Specific Gravity, Urine 1.020 1.005 - 1.030   Hgb urine dipstick 3+ (A) NEGATIVE   pH 5.0 5.0 - 8.0   Protein, ur 100 (A) NEGATIVE mg/dL   Nitrite NEGATIVE NEGATIVE   Leukocytes, UA NEGATIVE NEGATIVE   RBC / HPF 0-5 0 - 5 RBC/hpf   WBC, UA 0-5 0 - 5 WBC/hpf   Bacteria, UA NONE SEEN NONE SEEN   Squamous Epithelial / LPF 0-5 (A) NONE SEEN   Mucous PRESENT    Hyaline Casts, UA PRESENT   Magnesium     Status: None   Collection Time: 10/28/15  1:48 PM  Result Value Ref Range   Magnesium 1.7 1.7 - 2.4 mg/dL  Troponin I     Status: Abnormal   Collection Time: 10/28/15  1:48 PM  Result Value Ref Range   Troponin I 0.13 (H) <0.031 ng/mL    Comment: PREVIOUS RESULT CALLED AT 1112 10/28/15 BY DAS...MLZ        PERSISTENTLY INCREASED TROPONIN VALUES IN THE RANGE OF 0.04-0.49 ng/mL CAN BE SEEN IN:       -UNSTABLE ANGINA       -CONGESTIVE HEART FAILURE       -MYOCARDITIS       -CHEST TRAUMA       -ARRYHTHMIAS       -LATE PRESENTING MYOCARDIAL INFARCTION       -COPD   CLINICAL FOLLOW-UP RECOMMENDED.   TSH     Status: Abnormal   Collection Time: 10/28/15  1:48 PM  Result Value Ref Range   TSH 12.601 (H) 0.350 - 4.500 uIU/mL  Hemoglobin     Status: None   Collection Time: 10/28/15  4:37 PM  Result Value Ref Range   Hemoglobin 14.6 13.0 - 18.0 g/dL  Troponin I (q 6hr x 3)     Status: Abnormal   Collection Time: 10/28/15  7:04 PM  Result Value Ref Range   Troponin I 0.44 (H) <0.031 ng/mL    Comment: PREVIOUS RESULT CALLED AT 1112 10/28/15 BY DAS/MSS.        PERSISTENTLY INCREASED TROPONIN VALUES IN THE RANGE OF 0.04-0.49 ng/mL CAN BE SEEN IN:       -UNSTABLE ANGINA       -CONGESTIVE HEART FAILURE       -MYOCARDITIS       -CHEST TRAUMA       -ARRYHTHMIAS       -LATE PRESENTING  MYOCARDIAL INFARCTION       -COPD   CLINICAL FOLLOW-UP RECOMMENDED.   Magnesium  Status: None   Collection Time: 10/28/15  7:04 PM  Result Value Ref Range   Magnesium 1.8 1.7 - 2.4 mg/dL  Hemoglobin     Status: None   Collection Time: 10/28/15  9:03 PM  Result Value Ref Range   Hemoglobin 13.5 13.0 - 18.0 g/dL  Troponin I (q 6hr x 3)     Status: Abnormal   Collection Time: 10/29/15  1:08 AM  Result Value Ref Range   Troponin I 0.10 (H) <0.031 ng/mL    Comment: PREVIOUS RESULT CALLED BY DAS @ 1610 ON 10/28/2015 CAF        PERSISTENTLY INCREASED TROPONIN VALUES IN THE RANGE OF 0.04-0.49 ng/mL CAN BE SEEN IN:       -UNSTABLE ANGINA       -CONGESTIVE HEART FAILURE       -MYOCARDITIS       -CHEST TRAUMA       -ARRYHTHMIAS       -LATE PRESENTING MYOCARDIAL INFARCTION       -COPD   CLINICAL FOLLOW-UP RECOMMENDED.   Basic metabolic panel     Status: Abnormal   Collection Time: 10/29/15  5:19 AM  Result Value Ref Range   Sodium 144 135 - 145 mmol/L   Potassium 4.0 3.5 - 5.1 mmol/L   Chloride 107 101 - 111 mmol/L   CO2 26 22 - 32 mmol/L   Glucose, Bld 145 (H) 65 - 99 mg/dL   BUN 59 (H) 6 - 20 mg/dL   Creatinine, Ser 1.43 (H) 0.61 - 1.24 mg/dL   Calcium 9.3 8.9 - 10.3 mg/dL   GFR calc non Af Amer 45 (L) >60 mL/min   GFR calc Af Amer 52 (L) >60 mL/min    Comment: (NOTE) The eGFR has been calculated using the CKD EPI equation. This calculation has not been validated in all clinical situations. eGFR's persistently <60 mL/min signify possible Chronic Kidney Disease.    Anion gap 11 5 - 15  CBC     Status: Abnormal   Collection Time: 10/29/15  5:19 AM  Result Value Ref Range   WBC 10.2 3.8 - 10.6 K/uL   RBC 4.14 (L) 4.40 - 5.90 MIL/uL   Hemoglobin 13.4 13.0 - 18.0 g/dL   HCT 39.9 (L) 40.0 - 52.0 %   MCV 96.5 80.0 - 100.0 fL   MCH 32.4 26.0 - 34.0 pg   MCHC 33.6 32.0 - 36.0 g/dL   RDW 14.8 (H) 11.5 - 14.5 %   Platelets 191 150 - 440 K/uL  CK     Status: Abnormal    Collection Time: 10/29/15  5:19 AM  Result Value Ref Range   Total CK 1695 (H) 49 - 397 U/L  Hemoglobin     Status: None   Collection Time: 10/29/15  1:55 PM  Result Value Ref Range   Hemoglobin 13.4 13.0 - 18.0 g/dL    Current Facility-Administered Medications  Medication Dose Route Frequency Provider Last Rate Last Dose  . 0.9 % NaCl with KCl 20 mEq/ L  infusion   Intravenous Continuous Gladstone Lighter, MD 75 mL/hr at 10/29/15 0343    . acetaminophen (TYLENOL) tablet 650 mg  650 mg Oral Q6H PRN Gladstone Lighter, MD       Or  . acetaminophen (TYLENOL) suppository 650 mg  650 mg Rectal Q6H PRN Gladstone Lighter, MD      . ALPRAZolam Duanne Moron) tablet 0.125 mg  0.125 mg Oral BID Gonzella Lex, MD      .  amiodarone (PACERONE) tablet 200 mg  200 mg Oral Daily Gladstone Lighter, MD   200 mg at 10/29/15 1130  . docusate sodium (COLACE) capsule 100 mg  100 mg Oral BID Gladstone Lighter, MD   100 mg at 10/29/15 1130  . enoxaparin (LOVENOX) injection 40 mg  40 mg Subcutaneous Q24H Gladstone Lighter, MD   40 mg at 10/28/15 1617  . escitalopram (LEXAPRO) tablet 10 mg  10 mg Oral Daily Gonzella Lex, MD      . feeding supplement (BOOST / RESOURCE BREEZE) liquid 1 Container  1 Container Oral TID BM Gladstone Lighter, MD   1 Container at 10/29/15 1134  . ferrous sulfate tablet 325 mg  325 mg Oral Daily Gladstone Lighter, MD   325 mg at 10/29/15 1131  . [START ON 10/30/2015] levothyroxine (SYNTHROID, LEVOTHROID) tablet 100 mcg  100 mcg Oral QAC breakfast Fritzi Mandes, MD      . lisinopril (PRINIVIL,ZESTRIL) tablet 2.5 mg  2.5 mg Oral Daily Gladstone Lighter, MD   2.5 mg at 10/29/15 1131  . multivitamin with minerals tablet 1 tablet  1 tablet Oral Daily Gladstone Lighter, MD   1 tablet at 10/29/15 1130  . ondansetron (ZOFRAN) tablet 4 mg  4 mg Oral Q6H PRN Gladstone Lighter, MD       Or  . ondansetron (ZOFRAN) injection 4 mg  4 mg Intravenous Q6H PRN Gladstone Lighter, MD   4 mg at 10/29/15 1139  .  pantoprazole (PROTONIX) EC tablet 40 mg  40 mg Oral Daily Fritzi Mandes, MD   40 mg at 10/29/15 1130  . [START ON 10/30/2015] PARoxetine (PAXIL) tablet 10 mg  10 mg Oral Daily Gonzella Lex, MD      . rOPINIRole (REQUIP) tablet 0.25 mg  0.25 mg Oral QPM Gladstone Lighter, MD   0.25 mg at 10/28/15 1850  . sodium chloride flush (NS) 0.9 % injection 3 mL  3 mL Intravenous Q12H Gladstone Lighter, MD   3 mL at 10/29/15 1000  . tamsulosin (FLOMAX) capsule 0.4 mg  0.4 mg Oral Daily Gladstone Lighter, MD   0.4 mg at 10/29/15 1131    Musculoskeletal: Strength & Muscle Tone: decreased Gait & Station: unable to stand Patient leans: Backward  Psychiatric Specialty Exam: Review of Systems  Constitutional: Positive for malaise/fatigue.  HENT: Negative.   Eyes: Negative.   Respiratory: Negative.   Cardiovascular: Negative.   Gastrointestinal: Positive for nausea.  Musculoskeletal: Positive for joint pain and falls.  Skin: Negative.   Neurological: Positive for weakness.  Psychiatric/Behavioral: Positive for depression, suicidal ideas and memory loss. Negative for hallucinations and substance abuse. The patient is nervous/anxious and has insomnia.     Blood pressure 134/82, pulse 89, temperature 97.5 F (36.4 C), temperature source Oral, resp. rate 16, height 6' 1"  (1.854 m), weight 69.8 kg (153 lb 14.1 oz), SpO2 99 %.Body mass index is 20.31 kg/(m^2).  General Appearance: Disheveled  Eye Contact::  Minimal  Speech:  Garbled, Slow and Slurred  Volume:  Decreased  Mood:  Euthymic  Affect:  Flat  Thought Process:  Tangential  Orientation:  Other:  He was in the hospital but couldn't tell me the correct year even with 2 tries.  Thought Content:  Negative  Suicidal Thoughts:  Yes.  without intent/plan  Homicidal Thoughts:  No  Memory:  Immediate;   Poor Recent;   Poor Remote;   Poor  Judgement:  Impaired  Insight:  Shallow  Psychomotor Activity:  Decreased  Concentration:  Poor  Recall:  Poor   Fund of Knowledge:Poor  Language: Poor  Akathisia:  No  Handed:  Right  AIMS (if indicated):     Assets:  Financial Resources/Insurance Housing Social Support  ADL's:  Impaired  Cognition: Impaired,  Mild  Sleep:      Treatment Plan Summary: Daily contact with patient to assess and evaluate symptoms and progress in treatment, Medication management and Plan Patient with a history of major depression although currently to my examination he is still delirious. His daughter tells me that at his recent baseline he had not been noticeably demented or delirious so this is a change from where he was and is most likely due to the effects of the fall. He is also a possibility. The medicine could admit overdosed on on admission at "ever going to know that for certain. I do know that as an 82 year old male with a history of falls and poor self-care he does not need to be on as much Xanax as he is taking. I am going to cut his Xanax down to 0.125 mg twice a day. As his daughter says his antidepressant does not seem to be working well enough. I will cut his Paxil dose down to 10 mg and start him on Lexapro 10 mg with a plan of titrating him over. Patient is apparently going to go to rehabilitation at some point. Once he is out he really should follow up with an outpatient psychiatrist. I will try to make that recommendation. Meanwhile making some medicine changes and I will follow-up all he is in the hospital.  Disposition: Supportive therapy provided about ongoing stressors.  Alethia Berthold, MD 10/29/2015 2:51 PM

## 2015-10-29 NOTE — Progress Notes (Signed)
Primary nurse was notified by telemetry clerk that pt had a three beat run of Vtach; Pt VSS; Pt asyptomatic. Primary nurse paged and spoke to Dr. Sheryle Hail with  no new orders given. Primary nurse to continue to monitor.

## 2015-10-29 NOTE — Consult Note (Signed)
WOC wound consult note Reason for Consult:Fall at home with unstageable pressure injury to right posterior shoulder and sacrum.  Vasculitic changes to left medial foot.  Present on admission. Wound type:Pressure from fall with extended time down.  Pressure Ulcer POA: Yes Measurement:Right posterior shoulder  unstageable pressure injury, 100% necrotic  3 cm x 3 cm intact eschar.  Sacrum:  2 cm x 2 cm intact eschar with denuded skin to left and right upper buttocks Wound GEX:[id number] Drainage (amount, consistency, odor) none at this time, intact Periwound:intact Dressing procedure/placement/frequency:Cleanse wounds to right shoulder and sacrum with soap and water.  Apply silicone border foam dressing for protection.  Change every 3 days and PRN soilage.  Will not follow at this time.  Please re-consult if needed.  Maple Hudson RN BSN CWON Pager [phone]

## 2015-10-29 NOTE — [place] FL2 (Signed)
[place] MEDICAID FL2 LEVEL OF CARE SCREENING TOOL     IDENTIFICATION  Patient Name: Matthew Foley Birthdate: [birth year] Sex: male Admission Date (Current Location): 10/28/2015  [place] and [place] Number:  Chiropodist and Address:  [facility], [address] [place], [place] [address]      Provider Number: [id number]  Attending Physician Name and Address:  Enedina Finner, MD  Relative Name and Phone Number:       Current Level of Care: Hospital Recommended Level of Care: Skilled Nursing Facility Prior Approval Number:    Date Approved/Denied:   PASRR Number: [id number]  Discharge Plan: SNF    Current Diagnoses: Patient Active Problem List   Diagnosis Date Noted  . Pressure ulcer 10/29/2015  . Protein-calorie malnutrition, severe 10/29/2015  . Rhabdomyolysis 10/28/2015  . Drug overdose, intentional (HCC) 12/05/2014  . Arrhythmia, ventricular 12/05/2014  . CAD (coronary artery disease) 12/05/2014  . Depression 12/05/2014  . Major depression (HCC) 12/04/2014  . Suicidal behavior 12/04/2014  . Hypothyroid 12/04/2014  . Hypertension 12/04/2014    Orientation RESPIRATION BLADDER Height & Weight     Self, Place, Situation  Normal Incontinent Weight: 153 lb 14.1 oz (69.8 kg) Height:  6\' 1"  (185.4 cm)  BEHAVIORAL SYMPTOMS/MOOD NEUROLOGICAL BOWEL NUTRITION STATUS   (none)  (none) Incontinent Diet (clear liquid advance as tolerated)  AMBULATORY STATUS COMMUNICATION OF NEEDS Skin   Extensive Assist Verbally PU Stage and Appropriate Care                       Personal Care Assistance Level of Assistance              Functional Limitations Info  Sight, Hearing Sight Info: Impaired Hearing Info: Impaired      SPECIAL CARE FACTORS FREQUENCY  PT (By licensed PT)                    Contractures Contractures Info: Not present    Additional Factors Info  Code Status, Allergies Code Status Info:  Full Allergies Info: NKA           Current Medications (10/29/2015):  This is the current hospital active medication list Current Facility-Administered Medications  Medication Dose Route Frequency Provider Last Rate Last Dose  . 0.9 % NaCl with KCl 20 mEq/ L  infusion   Intravenous Continuous 12/29/2015, MD 75 mL/hr at 10/29/15 0343    . acetaminophen (TYLENOL) tablet 650 mg  650 mg Oral Q6H PRN 12/29/15, MD       Or  . acetaminophen (TYLENOL) suppository 650 mg  650 mg Rectal Q6H PRN Enid Baas, MD      . ALPRAZolam Enid Baas) tablet 0.5 mg  0.5 mg Oral BID Prudy Feeler, MD   0.5 mg at 10/29/15 1131  . amiodarone (PACERONE) tablet 200 mg  200 mg Oral Daily 12/29/15, MD   200 mg at 10/29/15 1130  . docusate sodium (COLACE) capsule 100 mg  100 mg Oral BID 12/29/15, MD   100 mg at 10/29/15 1130  . enoxaparin (LOVENOX) injection 40 mg  40 mg Subcutaneous Q24H 12/29/15, MD   40 mg at 10/28/15 1617  . feeding supplement (BOOST / RESOURCE BREEZE) liquid 1 Container  1 Container Oral TID BM 12/28/15, MD   1 Container at 10/29/15 1134  . ferrous sulfate tablet 325 mg  325 mg Oral Daily 12/29/15, MD   325 mg at  10/29/15 1131  . [START ON 10/30/2015] levothyroxine (SYNTHROID, LEVOTHROID) tablet 100 mcg  100 mcg Oral QAC breakfast Enedina Finner, MD      . lisinopril (PRINIVIL,ZESTRIL) tablet 2.5 mg  2.5 mg Oral Daily Enid Baas, MD   2.5 mg at 10/29/15 1131  . multivitamin with minerals tablet 1 tablet  1 tablet Oral Daily Enid Baas, MD   1 tablet at 10/29/15 1130  . ondansetron (ZOFRAN) tablet 4 mg  4 mg Oral Q6H PRN Enid Baas, MD       Or  . ondansetron (ZOFRAN) injection 4 mg  4 mg Intravenous Q6H PRN Enid Baas, MD   4 mg at 10/29/15 1139  . pantoprazole (PROTONIX) EC tablet 40 mg  40 mg Oral Daily Enedina Finner, MD   40 mg at 10/29/15 1130  . PARoxetine (PAXIL) tablet 20 mg  20 mg Oral Daily Enid Baas, MD   20 mg at 10/29/15 1131  . rOPINIRole (REQUIP) tablet 0.25 mg  0.25 mg Oral QPM Enid Baas, MD   0.25 mg at 10/28/15 1850  . sodium chloride flush (NS) 0.9 % injection 3 mL  3 mL Intravenous Q12H Enid Baas, MD   3 mL at 10/29/15 1000  . tamsulosin (FLOMAX) capsule 0.4 mg  0.4 mg Oral Daily Enid Baas, MD   0.4 mg at 10/29/15 1131     Discharge Medications: Please see discharge summary for a list of discharge medications.  Relevant Imaging Results:  Relevant Lab Results:   Additional Information SS: [id number]  [name], LCSW

## 2015-10-29 NOTE — Progress Notes (Signed)
Matthew Foley is a 82 y.o. male  [id number]  Primary Cardiologist: Adrian Blackwater Reason for Consultation: Status post fall with elevated troponin and hypokalemia  HPI: 82 year old white male with a past medical history of depression hypertension coronary artery disease status post PCI and stenting presented to the hospital after falling down. Patient denies any chest pain prior to fall as well as right now and no shortness of breath.   Review of Systems: No orthopnea PND or leg swelling   Past Medical History  Diagnosis Date  . Depression     h/o psychiatric hospitalisations prior  . Hypertension   . Hyperlipidemia   . CAD (coronary artery disease)     at least 1 stent  . Hx of pancreatitis   . Rheumatoid arthritis (HCC)   . Hypothyroidism   . GERD (gastroesophageal reflux disease)   . Atrial fibrillation (HCC)     Medications Prior to Admission  Medication Sig Dispense Refill  . ALPRAZolam (XANAX) 0.5 MG tablet Take 0.5 mg by mouth 2 (two) times daily.    [name] aspirin EC 81 MG EC tablet Take 1 tablet (81 mg total) by mouth daily. 30 tablet 0  . ferrous sulfate 325 (65 FE) MG tablet Take 325 mg by mouth daily.    [name] ketoconazole (NIZORAL) 2 % shampoo Apply 1 application topically once a week.    . levothyroxine (SYNTHROID, LEVOTHROID) 75 MCG tablet Take 75 mcg by mouth daily before breakfast.    . lisinopril (PRINIVIL,ZESTRIL) 2.5 MG tablet Take 2.5 mg by mouth daily.    . pantoprazole (PROTONIX) 40 MG tablet Take 40 mg by mouth daily.    [name] PARoxetine (PAXIL) 20 MG tablet Take 20 mg by mouth daily.    . potassium chloride SA (K-DUR,KLOR-CON) 20 MEQ tablet Take 20 mEq by mouth 2 (two) times daily.    [name] rOPINIRole (REQUIP) 0.25 MG tablet Take 0.25 mg by mouth every evening.    . tamsulosin (FLOMAX) 0.4 MG CAPS capsule Take 0.4 mg by mouth daily.       [name] ALPRAZolam  0.5 mg Oral BID  . amiodarone  200 mg Oral Daily  . docusate sodium  100 mg Oral BID  . enoxaparin  (LOVENOX) injection  40 mg Subcutaneous Q24H  . ferrous sulfate  325 mg Oral Daily  . levothyroxine  75 mcg Oral QAC breakfast  . lisinopril  2.5 mg Oral Daily  . [START ON 10/31/2015] pantoprazole (PROTONIX) IV  40 mg Intravenous Q12H  . PARoxetine  20 mg Oral Daily  . rOPINIRole  0.25 mg Oral QPM  . sodium chloride flush  3 mL Intravenous Q12H  . tamsulosin  0.4 mg Oral Daily    Infusions: . 0.9 % NaCl with KCl 20 mEq / L 75 mL/hr at 10/29/15 0343    No Known Allergies  Social History   Social History  . Marital Status: Widowed    Spouse Name: N/A  . Number of Children: N/A  . Years of Education: N/A   Occupational History  . Not on file.   Social History Main Topics  . Smoking status: Former Games developer  . Smokeless tobacco: Former Neurosurgeon     Comment: smokes cigars  . Alcohol Use: 0.0 oz/week    0 Standard drinks or equivalent per week     Comment: Margarita some times  . Drug Use: No  . Sexual Activity: Not on file   Other Topics Concern  . Not on file  Social History Narrative   Stays in an apartment by himself. Ambulates with a cane.    Family History  Problem Relation Age of Onset  . Hypertension Mother   . Hypertension Father   . CAD Father     PHYSICAL EXAM: Filed Vitals:   10/28/15 2013 10/29/15 0456  BP: 141/85 132/80  Pulse: 86 88  Temp: 97.9 F (36.6 C) 97.7 F (36.5 C)  Resp: 22 20     Intake/Output Summary (Last 24 hours) at 10/29/15 0909 Last data filed at 10/29/15 0536  Gross per 24 hour  Intake   1240 ml  Output      0 ml  Net   1240 ml    General:  Well appearing. No respiratory difficulty HEENT: normal Neck: supple. no JVD. Carotids 2+ bilat; no bruits. No lymphadenopathy or thryomegaly appreciated. Cor: PMI nondisplaced. Regular rate & rhythm. No rubs, gallops or murmurs. Lungs: clear Abdomen: soft, nontender, nondistended. No hepatosplenomegaly. No bruits or masses. Good bowel sounds. Extremities: no cyanosis, clubbing,  rash, edema Neuro: alert & oriented x 3, cranial nerves grossly intact. moves all 4 extremities w/o difficulty. Affect pleasant.  UUE:KCMKL rhythm with right bundle branch block nonspecific ST-T changes  Results for orders placed or performed during the hospital encounter of 10/28/15 (from the past 24 hour(s))  CBC with Differential     Status: Abnormal   Collection Time: 10/28/15 10:22 AM  Result Value Ref Range   WBC 17.5 (H) 3.8 - 10.6 K/uL   RBC 4.47 4.40 - 5.90 MIL/uL   Hemoglobin 14.4 13.0 - 18.0 g/dL   HCT 49.1 79.1 - 50.5 %   MCV 97.0 80.0 - 100.0 fL   MCH 32.2 26.0 - 34.0 pg   MCHC 33.2 32.0 - 36.0 g/dL   RDW 69.7 94.8 - 01.6 %   Platelets 217 150 - 440 K/uL   Neutrophils Relative % 88% %   Neutro Abs 15.4 (H) 1.4 - 6.5 K/uL   Lymphocytes Relative 5% %   Lymphs Abs 0.9 (L) 1.0 - 3.6 K/uL   Monocytes Relative 7% %   Monocytes Absolute 1.2 (H) 0.2 - 1.0 K/uL   Eosinophils Relative 0% %   Eosinophils Absolute 0.0 0 - 0.7 K/uL   Basophils Relative 0% %   Basophils Absolute 0.0 0 - 0.1 K/uL  Comprehensive metabolic panel     Status: Abnormal   Collection Time: 10/28/15 10:22 AM  Result Value Ref Range   Sodium 142 135 - 145 mmol/L   Potassium 3.4 (L) 3.5 - 5.1 mmol/L   Chloride 100 (L) 101 - 111 mmol/L   CO2 26 22 - 32 mmol/L   Glucose, Bld 173 (H) 65 - 99 mg/dL   BUN 50 (H) 6 - 20 mg/dL   Creatinine, Ser 5.53 (H) 0.61 - 1.24 mg/dL   Calcium 9.8 8.9 - 74.8 mg/dL   Total Protein 8.2 (H) 6.5 - 8.1 g/dL   Albumin 3.9 3.5 - 5.0 g/dL   AST 270 (H) 15 - 41 U/L   ALT 38 17 - 63 U/L   Alkaline Phosphatase 51 38 - 126 U/L   Total Bilirubin 1.2 0.3 - 1.2 mg/dL   GFR calc non Af Amer 45 (L) >60 mL/min   GFR calc Af Amer 53 (L) >60 mL/min   Anion gap 16 (H) 5 - 15  Protime-INR     Status: None   Collection Time: 10/28/15 10:22 AM  Result Value Ref Range   Prothrombin  Time 13.7 11.4 - 15.0 seconds   INR 1.03   Troponin I     Status: Abnormal   Collection Time: 10/28/15  10:22 AM  Result Value Ref Range   Troponin I 0.05 (H) <0.031 ng/mL  Type and screen [facility]     Status: None   Collection Time: 10/28/15 10:22 AM  Result Value Ref Range   ABO/RH(D) O POS    Antibody Screen NEG    Sample Expiration 10/31/2015   CK     Status: Abnormal   Collection Time: 10/28/15 10:22 AM  Result Value Ref Range   Total CK 3635 (H) 49 - 397 U/L  Lipase, blood     Status: None   Collection Time: 10/28/15 10:22 AM  Result Value Ref Range   Lipase 18 11 - 51 U/L  ABO/Rh     Status: None   Collection Time: 10/28/15 10:23 AM  Result Value Ref Range   ABO/RH(D) O POS   Urinalysis complete, with microscopic ([facility] only)     Status: Abnormal   Collection Time: 10/28/15  1:19 PM  Result Value Ref Range   Color, Urine YELLOW (A) YELLOW   APPearance CLEAR (A) CLEAR   Glucose, UA NEGATIVE NEGATIVE mg/dL   Bilirubin Urine NEGATIVE NEGATIVE   Ketones, ur NEGATIVE NEGATIVE mg/dL   Specific Gravity, Urine 1.020 1.005 - 1.030   Hgb urine dipstick 3+ (A) NEGATIVE   pH 5.0 5.0 - 8.0   Protein, ur 100 (A) NEGATIVE mg/dL   Nitrite NEGATIVE NEGATIVE   Leukocytes, UA NEGATIVE NEGATIVE   RBC / HPF 0-5 0 - 5 RBC/hpf   WBC, UA 0-5 0 - 5 WBC/hpf   Bacteria, UA NONE SEEN NONE SEEN   Squamous Epithelial / LPF 0-5 (A) NONE SEEN   Mucous PRESENT    Hyaline Casts, UA PRESENT   Magnesium     Status: None   Collection Time: 10/28/15  1:48 PM  Result Value Ref Range   Magnesium 1.7 1.7 - 2.4 mg/dL  Troponin I     Status: Abnormal   Collection Time: 10/28/15  1:48 PM  Result Value Ref Range   Troponin I 0.13 (H) <0.031 ng/mL  TSH     Status: Abnormal   Collection Time: 10/28/15  1:48 PM  Result Value Ref Range   TSH 12.601 (H) 0.350 - 4.500 uIU/mL  Hemoglobin     Status: None   Collection Time: 10/28/15  4:37 PM  Result Value Ref Range   Hemoglobin 14.6 13.0 - 18.0 g/dL  Troponin I (q 6hr x 3)     Status: Abnormal   Collection Time: 10/28/15  7:04 PM   Result Value Ref Range   Troponin I 0.44 (H) <0.031 ng/mL  Magnesium     Status: None   Collection Time: 10/28/15  7:04 PM  Result Value Ref Range   Magnesium 1.8 1.7 - 2.4 mg/dL  Hemoglobin     Status: None   Collection Time: 10/28/15  9:03 PM  Result Value Ref Range   Hemoglobin 13.5 13.0 - 18.0 g/dL  Troponin I (q 6hr x 3)     Status: Abnormal   Collection Time: 10/29/15  1:08 AM  Result Value Ref Range   Troponin I 0.10 (H) <0.031 ng/mL  Basic metabolic panel     Status: Abnormal   Collection Time: 10/29/15  5:19 AM  Result Value Ref Range   Sodium 144 135 - 145 mmol/L   Potassium  4.0 3.5 - 5.1 mmol/L   Chloride 107 101 - 111 mmol/L   CO2 26 22 - 32 mmol/L   Glucose, Bld 145 (H) 65 - 99 mg/dL   BUN 59 (H) 6 - 20 mg/dL   Creatinine, Ser 5.85 (H) 0.61 - 1.24 mg/dL   Calcium 9.3 8.9 - 27.7 mg/dL   GFR calc non Af Amer 45 (L) >60 mL/min   GFR calc Af Amer 52 (L) >60 mL/min   Anion gap 11 5 - 15  CBC     Status: Abnormal   Collection Time: 10/29/15  5:19 AM  Result Value Ref Range   WBC 10.2 3.8 - 10.6 K/uL   RBC 4.14 (L) 4.40 - 5.90 MIL/uL   Hemoglobin 13.4 13.0 - 18.0 g/dL   HCT 82.4 (L) 23.5 - 36.1 %   MCV 96.5 80.0 - 100.0 fL   MCH 32.4 26.0 - 34.0 pg   MCHC 33.6 32.0 - 36.0 g/dL   RDW 44.3 (H) 15.4 - 00.8 %   Platelets 191 150 - 440 K/uL  CK     Status: Abnormal   Collection Time: 10/29/15  5:19 AM  Result Value Ref Range   Total CK 1695 (H) 49 - 397 U/L   Dg Chest 1 View  10/28/2015  CLINICAL DATA:  Confusion/altered mental status EXAM: CHEST 1 VIEW COMPARISON:  August 29, 2015 chest radiograph and chest CT September 02, 2015 FINDINGS: There is no edema or consolidation. The heart size and pulmonary vascularity are normal. No adenopathy. No bone lesions. IMPRESSION: No edema or consolidation. Electronically Signed   By: Bretta Bang III M.D.   On: 10/28/2015 10:58   Dg Shoulder Right  10/28/2015  CLINICAL DATA:  Patient found on floor.  Right shoulder pain. EXAM:  RIGHT SHOULDER - 2+ VIEW COMPARISON:  None. FINDINGS: The right shoulder is located. No acute bone or soft tissue abnormality. A remote Bankart fracture is evident. The visualized right hemithorax is clear. The visualize right clavicle is within normal limits. IMPRESSION: No acute abnormality. Electronically Signed   By: Marin Roberts M.D.   On: 10/28/2015 11:32   Ct Head Wo Contrast  10/28/2015  CLINICAL DATA:  Patient found down today. Altered mental status. Initial encounter. EXAM: CT HEAD WITHOUT CONTRAST CT CERVICAL SPINE WITHOUT CONTRAST TECHNIQUE: Multidetector CT imaging of the head and cervical spine was performed following the standard protocol without intravenous contrast. Multiplanar CT image reconstructions of the cervical spine were also generated. COMPARISON:  Head CT scan 11/06/2013. FINDINGS: CT HEAD FINDINGS There is cortical atrophy and chronic microvascular ischemic change. No evidence of acute intracranial abnormality including hemorrhage, infarct, mass lesion, mass effect, midline shift or abnormal extra-axial fluid collection. Right mastoid effusion is seen. Left mastoid air cells and imaged paranasal sinuses are clear. The calvarium is intact. CT CERVICAL SPINE FINDINGS No cervical spine fracture is identified. 0.3 cm anterolisthesis C4 on C5 and trace anterolisthesis C5 on C6 is due to facet arthropathy. The C3-4 and C4-5 facet joints and disc interspaces are fused autologous leak. Lung apices are clear. IMPRESSION: No acute abnormality head or cervical spine. Right mastoid effusion. Multilevel cervical spondylosis. Electronically Signed   By: Drusilla Kanner M.D.   On: 10/28/2015 11:33   Ct Cervical Spine Wo Contrast  10/28/2015  CLINICAL DATA:  Patient found down today. Altered mental status. Initial encounter. EXAM: CT HEAD WITHOUT CONTRAST CT CERVICAL SPINE WITHOUT CONTRAST TECHNIQUE: Multidetector CT imaging of the head and cervical spine was  performed following the standard  protocol without intravenous contrast. Multiplanar CT image reconstructions of the cervical spine were also generated. COMPARISON:  Head CT scan 11/06/2013. FINDINGS: CT HEAD FINDINGS There is cortical atrophy and chronic microvascular ischemic change. No evidence of acute intracranial abnormality including hemorrhage, infarct, mass lesion, mass effect, midline shift or abnormal extra-axial fluid collection. Right mastoid effusion is seen. Left mastoid air cells and imaged paranasal sinuses are clear. The calvarium is intact. CT CERVICAL SPINE FINDINGS No cervical spine fracture is identified. 0.3 cm anterolisthesis C4 on C5 and trace anterolisthesis C5 on C6 is due to facet arthropathy. The C3-4 and C4-5 facet joints and disc interspaces are fused autologous leak. Lung apices are clear. IMPRESSION: No acute abnormality head or cervical spine. Right mastoid effusion. Multilevel cervical spondylosis. Electronically Signed   By: Drusilla Kanner M.D.   On: 10/28/2015 11:33   Dg Foot Complete Left  10/28/2015  CLINICAL DATA:  Pain following fall EXAM: LEFT FOOT - COMPLETE 3+ VIEW COMPARISON:  None. FINDINGS: Frontal, oblique, and lateral views were obtained. There is pes planus. Bones are osteoporotic. No acute fracture or dislocation is evident. Flexion deformities are noted in the MTP and PIP joint regions. There is moderate narrowing of all PIP and DIP joints as well as the first MTP joint. Advanced arthropathy at the tarsal-metatarsal junction. No erosive change or bony destruction evident. IMPRESSION: Extensive arthropathy, most marked at the tarsal-metatarsal level. No fracture or dislocation. Pes planus. Electronically Signed   By: Bretta Bang III M.D.   On: 10/28/2015 11:30     ASSESSMENT AND PLAN: Status post fall probably related to arrhythmia had nonsustained V. tach recently and that may be related to hypokalemia. Advise replacing the potassium 4. Also elevated troponin and may be rebuilt  related to demand ischemia and rhabdomyolysis. We will get an echocardiogram to further evaluate wall motion.  Yulisa Chirico A

## 2015-10-29 NOTE — Progress Notes (Signed)
Physical Therapy Treatment Patient Details Name: Matthew Foley MRN: [id number] DOB: 1933-04-04 Today's Date: 10/29/2015    History of Present Illness Matthew Foley  is a 82 y.o. male with a known history of Atrial fibrillation not on anticoagulation, CAD, history of major depression, hypertension, arthritis who lives at home by himself presents to the hospital secondary to a fall and laying on the floor for almost 2 days now. Patient is very confused at this time. Most of the history is obtained from his daughter at bedside. Patient apparently lives in an apartment complex by himself and is independent and ambulates with the help of a cane. He has had a fall may be 1-2 days ago and has been on the floor since then. His daughter has been trying to reach him but nobody was picking up the phone for 2 days now. They had to call the apartment complex people and he was noted to be laying in his feces and also had coffee-ground dried-up material on his face and also neck. Patient is unable to tell if he had vomited or not. No prior history of hematemesis. No recent illnesses, no fevers or chills. CT of the head negative for any acute findings. Labs indicate rhabdomyolysis and acute renal failure. Urine analysis is pending. Pt is AOx2 at time of PT evaluation. Denies other falls in the last 12 months. History does not appear to be accurate and no family present to supplement during PT evaluation.     PT Comments    Pt agreeable to PT; pt requests changing brief before exercises. Nursing assistant contacted. Min A for rolling for change/personal hygiene. Pt participates well with supine and seated exercises mostly active range of motion; min A as needed. Min A for supine to/from sit. Once sitting pt refuses further transfers in stand/to chair. Pt notes fatigue and wishes to rest. Nursing notified on skin tears on back. Pt returned to bed post seated exercises and nursing assessment of skin tears on back.  Continue PT to progress strength, endurance to improve all functional mobility and promote out of bed activities.   Follow Up Recommendations  SNF     Equipment Recommendations       Recommendations for Other Services       Precautions / Restrictions Precautions Precautions: Fall Restrictions Weight Bearing Restrictions: No    Mobility  Bed Mobility Overal bed mobility: Needs Assistance Bed Mobility: Rolling Rolling: Min assist   Supine to sit: Min assist Sit to supine: Min assist   General bed mobility comments: Increased time/effort and Min A for LEs and trunk  Transfers                 General transfer comment: Once sitting, refuses stand transfer or to chair. Wishes back to bed  Ambulation/Gait                 Stairs            Wheelchair Mobility    Modified Rankin (Stroke Patients Only)       Balance   Sitting-balance support: Feet supported;Bilateral upper extremity supported Sitting balance-Leahy Scale: Fair                              Cognition Arousal/Alertness: Awake/alert Behavior During Therapy: WFL for tasks assessed/performed Overall Cognitive Status: Within Functional Limits for tasks assessed  Exercises General Exercises - Lower Extremity Ankle Circles/Pumps: AROM;Both;20 reps;Supine Quad Sets: Strengthening;Both;15 reps;Supine Gluteal Sets: Strengthening;Both;15 reps;Supine Short Arc Quad: AROM;Both;20 reps;Supine Long Arc Quad: AROM;Both;10 reps;Seated Heel Slides: AROM;Both;15 reps;Supine Hip ABduction/ADduction: AAROM;Both;15 reps;Supine Straight Leg Raises: AAROM;Both;10 reps;Supine Hip Flexion/Marching: AROM;Both;15 reps;Seated    General Comments General comments (skin integrity, edema, etc.): skin tears noted along spine      Pertinent Vitals/Pain Pain Assessment: No/denies pain    Home Living                      Prior Function            PT  Goals (current goals can now be found in the care plan section) Progress towards PT goals: Progressing toward goals    Frequency  Min 2X/week    PT Plan Current plan remains appropriate    Co-evaluation             End of Session   Activity Tolerance: Patient tolerated treatment well;Patient limited by fatigue Patient left: with bed alarm set;with call bell/phone within reach;in bed     Time: 6384-6659 PT Time Calculation (min) (ACUTE ONLY): 28 min  Charges:  $Therapeutic Exercise: 8-22 mins $Therapeutic Activity: 8-22 mins                    G CodesKristeen Miss, PTA 10/29/2015, 3:07 PM

## 2015-10-29 NOTE — Progress Notes (Signed)
Central Monitoring called and stated that patient had EKG changes and wanted to know if we wanted to do a 12 lead EKG. Dr. Enedina Finner called and order received to do the 12 lead EKG

## 2015-10-29 NOTE — Progress Notes (Signed)
Primry nurse spoke to Dr. Dr. Anne Hahn in regard to increase troponins. Nio new orders at this time. Primary nurse to continue to monitor.

## 2015-10-29 NOTE — Progress Notes (Signed)
Patient ID: Matthew Foley, male   DOB: Dec 14, 1932, 82 y.o.   MRN: [id number] [facility] [place] [facility]   PATIENT NAME: Matthew Foley    MR#:  [id number]  DATE OF BIRTH:  [birth year]  SUBJECTIVE:   Came in after pt was found on the floor by apartment manager. found to have acute rhabdo.  Gras-dter in the room REVIEW OF SYSTEMS:   Review of Systems  Constitutional: Negative for fever, chills and weight loss.  HENT: Negative for ear discharge, ear pain and nosebleeds.   Eyes: Negative for blurred vision, pain and discharge.  Respiratory: Negative for sputum production, shortness of breath, wheezing and stridor.   Cardiovascular: Negative for chest pain, palpitations, orthopnea and PND.  Gastrointestinal: Negative for nausea, vomiting, abdominal pain and diarrhea.  Genitourinary: Negative for urgency and frequency.  Musculoskeletal: Positive for joint pain and falls. Negative for back pain.  Neurological: Positive for weakness. Negative for sensory change, speech change and focal weakness.  Psychiatric/Behavioral: Negative for depression and hallucinations. The patient is not nervous/anxious.   All other systems reviewed and are negative.  Tolerating Diet:yes Tolerating PT: recommends rehab  DRUG ALLERGIES:  No Known Allergies  VITALS:  Blood pressure 134/82, pulse 87, temperature 97.5 F (36.4 C), temperature source Oral, resp. rate 16, height 6\' 1"  (1.854 m), weight 69.8 kg (153 lb 14.1 oz), SpO2 97 %.  PHYSICAL EXAMINATION:   Physical Exam  GENERAL:  82 y.o.-year-old patient lying in the bed with no acute distress. thin EYES: Pupils equal, round, reactive to light and accommodation. No scleral icterus. Extraocular muscles intact.  HEENT: Head atraumatic, normocephalic. Oropharynx and nasopharynx clear.  NECK:  Supple, no jugular venous distention. No thyroid enlargement, no tenderness.  LUNGS: Normal breath sounds bilaterally, no  wheezing, rales, rhonchi. No use of accessory muscles of respiration.  CARDIOVASCULAR: S1, S2 normal. No murmurs, rubs, or gallops.  ABDOMEN: Soft, nontender, nondistended. Bowel sounds present. No organomegaly or mass.  EXTREMITIES: No cyanosis, clubbing or edema b/l.    NEUROLOGIC: Cranial nerves II through XII are intact. No focal Motor or sensory deficits b/l.   PSYCHIATRIC:  patient is alert and oriented x 3.  SKIN: No obvious rash, lesion, or ulcer.   LABORATORY PANEL:  CBC  Recent Labs Lab 10/29/15 0519 10/29/15 1355  WBC 10.2  --   HGB 13.4 13.4  HCT 39.9*  --   PLT 191  --     Chemistries   Recent Labs Lab 10/28/15 1022  10/28/15 1904 10/29/15 0519  NA 142  --   --  144  K 3.4*  --   --  4.0  CL 100*  --   --  107  CO2 26  --   --  26  GLUCOSE 173*  --   --  145*  BUN 50*  --   --  59*  CREATININE 1.41*  --   --  1.43*  CALCIUM 9.8  --   --  9.3  MG  --   < > 1.8  --   AST 116*  --   --   --   ALT 38  --   --   --   ALKPHOS 51  --   --   --   BILITOT 1.2  --   --   --   < > = values in this interval not displayed. Cardiac Enzymes  Recent Labs Lab 10/29/15 0108  TROPONINI 0.10*  RADIOLOGY:  Dg Chest 1 View  10/28/2015  CLINICAL DATA:  Confusion/altered mental status EXAM: CHEST 1 VIEW COMPARISON:  August 29, 2015 chest radiograph and chest CT September 02, 2015 FINDINGS: There is no edema or consolidation. The heart size and pulmonary vascularity are normal. No adenopathy. No bone lesions. IMPRESSION: No edema or consolidation. Electronically Signed   By: Bretta Bang III M.D.   On: 10/28/2015 10:58   Dg Shoulder Right  10/28/2015  CLINICAL DATA:  Patient found on floor.  Right shoulder pain. EXAM: RIGHT SHOULDER - 2+ VIEW COMPARISON:  None. FINDINGS: The right shoulder is located. No acute bone or soft tissue abnormality. A remote Bankart fracture is evident. The visualized right hemithorax is clear. The visualize right clavicle is within normal limits.  IMPRESSION: No acute abnormality. Electronically Signed   By: Marin Roberts M.D.   On: 10/28/2015 11:32   Ct Head Wo Contrast  10/28/2015  CLINICAL DATA:  Patient found down today. Altered mental status. Initial encounter. EXAM: CT HEAD WITHOUT CONTRAST CT CERVICAL SPINE WITHOUT CONTRAST TECHNIQUE: Multidetector CT imaging of the head and cervical spine was performed following the standard protocol without intravenous contrast. Multiplanar CT image reconstructions of the cervical spine were also generated. COMPARISON:  Head CT scan 11/06/2013. FINDINGS: CT HEAD FINDINGS There is cortical atrophy and chronic microvascular ischemic change. No evidence of acute intracranial abnormality including hemorrhage, infarct, mass lesion, mass effect, midline shift or abnormal extra-axial fluid collection. Right mastoid effusion is seen. Left mastoid air cells and imaged paranasal sinuses are clear. The calvarium is intact. CT CERVICAL SPINE FINDINGS No cervical spine fracture is identified. 0.3 cm anterolisthesis C4 on C5 and trace anterolisthesis C5 on C6 is due to facet arthropathy. The C3-4 and C4-5 facet joints and disc interspaces are fused autologous leak. Lung apices are clear. IMPRESSION: No acute abnormality head or cervical spine. Right mastoid effusion. Multilevel cervical spondylosis. Electronically Signed   By: Drusilla Kanner M.D.   On: 10/28/2015 11:33   Ct Cervical Spine Wo Contrast  10/28/2015  CLINICAL DATA:  Patient found down today. Altered mental status. Initial encounter. EXAM: CT HEAD WITHOUT CONTRAST CT CERVICAL SPINE WITHOUT CONTRAST TECHNIQUE: Multidetector CT imaging of the head and cervical spine was performed following the standard protocol without intravenous contrast. Multiplanar CT image reconstructions of the cervical spine were also generated. COMPARISON:  Head CT scan 11/06/2013. FINDINGS: CT HEAD FINDINGS There is cortical atrophy and chronic microvascular ischemic change. No  evidence of acute intracranial abnormality including hemorrhage, infarct, mass lesion, mass effect, midline shift or abnormal extra-axial fluid collection. Right mastoid effusion is seen. Left mastoid air cells and imaged paranasal sinuses are clear. The calvarium is intact. CT CERVICAL SPINE FINDINGS No cervical spine fracture is identified. 0.3 cm anterolisthesis C4 on C5 and trace anterolisthesis C5 on C6 is due to facet arthropathy. The C3-4 and C4-5 facet joints and disc interspaces are fused autologous leak. Lung apices are clear. IMPRESSION: No acute abnormality head or cervical spine. Right mastoid effusion. Multilevel cervical spondylosis. Electronically Signed   By: Drusilla Kanner M.D.   On: 10/28/2015 11:33   Dg Foot Complete Left  10/28/2015  CLINICAL DATA:  Pain following fall EXAM: LEFT FOOT - COMPLETE 3+ VIEW COMPARISON:  None. FINDINGS: Frontal, oblique, and lateral views were obtained. There is pes planus. Bones are osteoporotic. No acute fracture or dislocation is evident. Flexion deformities are noted in the MTP and PIP joint regions. There is moderate narrowing of all  PIP and DIP joints as well as the first MTP joint. Advanced arthropathy at the tarsal-metatarsal junction. No erosive change or bony destruction evident. IMPRESSION: Extensive arthropathy, most marked at the tarsal-metatarsal level. No fracture or dislocation. Pes planus. Electronically Signed   By: Bretta Bang III M.D.   On: 10/28/2015 11:30   ASSESSMENT AND PLAN:   Jabes Brotherson is a 82 y.o. male with a known history of Atrial fibrillation not on anticoagulation, CAD, history of major depression, hypertension, arthritis who lives at home by himself presents to the hospital secondary to a fall and laying on the floor for almost 2 days now.  #1 acute metabolic encephalopathy-could be from renal failure and rhabdomyolysis and dehydration. -UA no infection -CT of the head with no acute findings. Continue fluids    #2 rhabdomyolysis-CK greater than 3000. Gentle hydration and monitor CK levels Ck 3600---1600  #3 acute renal failure-secondary to rhabdomyolysis. IV hydration and monitor renal function.  #4 hypokalemia-being replaced.  #5 history of atrial fibrillation- follows with cardiology. Replace electrolytes. -Continue amiodarone. -Aspirin on hold.  #6 hematemesis-known history of GERD. Hemoglobin is stable now. -Check hemoglobin every 8 hours. Hold aspirin. -GI consulted. Started on liquid diet -Continue IV Protonix -hgb stable.  No more vomiting NO gi MD on call Will advance diet  #7 severe protein calorie malnutrition- being monitored as outpatient. Dietary consult placed  #8 BPH-continue Flomax  #9 DVT prophylaxis-on Lovenox  #10 chronic depression seen by dr clapacs Titrating paxil and started on po lexapro  To rehab when stable Case discussed with Care Management/Social Worker. Management plans discussed with the patient, family and they are in agreement.  CODE STATUS: full  DVT Prophylaxis: scd/teds  TOTAL TIME TAKING CARE OF THIS PATIENT:30 minutes.  >50% time spent on counselling and coordination of care  POSSIBLE D/C IN 1-2 DAYS, DEPENDING ON CLINICAL CONDITION.  Note: This dictation was prepared with Dragon dictation along with smaller phrase technology. Any transcriptional errors that result from this process are unintentional.  Shresta Risden M.D on 10/29/2015 at 4:22 PM  Between 7am to 6pm - Pager - [phone]  After 6pm go to [email or web address] - password [email or web address] [facility]  [facility]  Office  [phone]  CC: Primary care physician; Jaclyn Shaggy, MD

## 2015-10-30 LAB — CREATININE, SERUM
Creatinine, Ser: 1.44 mg/dL — ABNORMAL HIGH (ref 0.61–1.24)
GFR calc Af Amer: 51 mL/min — ABNORMAL LOW (ref >60)
GFR, EST NON AFRICAN AMERICAN: 44 mL/min — AB (ref >60)

## 2015-10-30 LAB — CK: Total CK: 567 U/L — ABNORMAL HIGH (ref 49–397)

## 2015-10-30 MED ORDER — SODIUM CHLORIDE 0.9 % IV SOLN
INTRAVENOUS | Status: DC
Start: 2015-10-30 — End: 2015-10-31
  Administered 2015-10-30 – 2015-10-31 (×2): via INTRAVENOUS

## 2015-10-30 NOTE — Progress Notes (Signed)
Physical Therapy Treatment Patient Details Name: Philander Ake MRN: [id number] DOB: 09/14/1932 Today's Date: 10/30/2015    History of Present Illness [place]au  is a 82 y.o. male with a known history of Atrial fibrillation not on anticoagulation, CAD, history of major depression, hypertension, arthritis who lives at home by himself presents to the hospital secondary to a fall and laying on the floor for almost 2 days now. Patient is very confused at this time. Most of the history is obtained from his daughter at bedside. Patient apparently lives in an apartment complex by himself and is independent and ambulates with the help of a cane. He has had a fall may be 1-2 days ago and has been on the floor since then. His daughter has been trying to reach him but nobody was picking up the phone for 2 days now. They had to call the apartment complex people and he was noted to be laying in his feces and also had coffee-ground dried-up material on his face and also neck. Patient is unable to tell if he had vomited or not. No prior history of hematemesis. No recent illnesses, no fevers or chills. CT of the head negative for any acute findings. Labs indicate rhabdomyolysis and acute renal failure. Urine analysis is pending. Pt is AOx2 at time of PT evaluation. Denies other falls in the last 12 months. History does not appear to be accurate and no family present to supplement during PT evaluation.     PT Comments    Pt agreeable to bed exercises only; refuses out of bed. Pt participates in short session of supine bed exercises limited by fatigue today, as pt performs less repetitions than last session. Continue PT to progress strength, endurance and participation in PT to improve all functional mobility.   Follow Up Recommendations  SNF     Equipment Recommendations       Recommendations for Other Services       Precautions / Restrictions Precautions Precautions: Fall Restrictions Weight  Bearing Restrictions: No    Mobility  Bed Mobility               General bed mobility comments: Refused up/out of bed  Transfers                    Ambulation/Gait                 Stairs            Wheelchair Mobility    Modified Rankin (Stroke Patients Only)       Balance                                    Cognition Arousal/Alertness: Awake/alert Behavior During Therapy: WFL for tasks assessed/performed Overall Cognitive Status: Within Functional Limits for tasks assessed                      Exercises General Exercises - Lower Extremity Ankle Circles/Pumps: AROM;Both;20 reps;Supine Quad Sets: Strengthening;Both;Supine;10 reps Gluteal Sets: Strengthening;Both;Supine;10 reps Short Arc Quad: AROM;Both;Supine;10 reps Heel Slides: Both;Supine;10 reps;AAROM Hip ABduction/ADduction: AAROM;Both;Supine;10 reps    General Comments        Pertinent Vitals/Pain Pain Assessment: No/denies pain    Home Living                      Prior Function  PT Goals (current goals can now be found in the care plan section) Progress towards PT goals: Progressing toward goals (slowly)    Frequency  Min 2X/week    PT Plan Current plan remains appropriate    Co-evaluation             End of Session   Activity Tolerance: Patient tolerated treatment well;Patient limited by fatigue Patient left: in bed;with call bell/phone within reach;with chair alarm set     Time: 1014-1030 PT Time Calculation (min) (ACUTE ONLY): 16 min  Charges:  $Therapeutic Exercise: 8-22 mins                    G Codes:      Kristeen Miss, PTA 10/30/2015, 11:23 AM

## 2015-10-30 NOTE — Clinical Social Work Note (Signed)
Patient is to discharge today per MD. CSW contacted patient's daughter this morning and extended bed offers. She has chosen [facility]. [facility] beginning auth from [place].  [name] MSW,LCSW [phone]

## 2015-10-30 NOTE — Progress Notes (Signed)
SUBJECTIVE: Patient is feeling well this morning, alert and talkative, eating breakfast. He denies any chest pain, shortness of breath, or dizziness. He is stating that he is anxious to go home.   Filed Vitals:   10/29/15 1315 10/29/15 1439 10/29/15 2157 10/30/15 0613  BP: 134/82  119/75 126/78  Pulse: 89 87 80 81  Temp: 97.5 F (36.4 C)  97.6 F (36.4 C) 97.9 F (36.6 C)  TempSrc: Oral  Oral Oral  Resp: 16  20 20   Height:      Weight:      SpO2: 99% 97% 98% 100%    Intake/Output Summary (Last 24 hours) at 10/30/15 0849 Last data filed at 10/30/15 0700  Gross per 24 hour  Intake   2336 ml  Output      0 ml  Net   2336 ml    LABS: Basic Metabolic Panel:  Recent Labs  12/30/15 1022 10/28/15 1348 10/28/15 1904 10/29/15 0519  NA 142  --   --  144  K 3.4*  --   --  4.0  CL 100*  --   --  107  CO2 26  --   --  26  GLUCOSE 173*  --   --  145*  BUN 50*  --   --  59*  CREATININE 1.41*  --   --  1.43*  CALCIUM 9.8  --   --  9.3  MG  --  1.7 1.8  --    Liver Function Tests:  Recent Labs  10/28/15 1022  AST 116*  ALT 38  ALKPHOS 51  BILITOT 1.2  PROT 8.2*  ALBUMIN 3.9    Recent Labs  10/28/15 1022  LIPASE 18   CBC:  Recent Labs  10/28/15 1022  10/29/15 0519 10/29/15 1355  WBC 17.5*  --  10.2  --   NEUTROABS 15.4*  --   --   --   HGB 14.4  < > 13.4 13.4  HCT 43.4  --  39.9*  --   MCV 97.0  --  96.5  --   PLT 217  --  191  --   < > = values in this interval not displayed. Cardiac Enzymes:  Recent Labs  10/28/15 1022 10/28/15 1348 10/28/15 1904 10/29/15 0108 10/29/15 0519  CKTOTAL 3635*  --   --   --  1695*  TROPONINI 0.05* 0.13* 0.44* 0.10*  --    BNP: Invalid input(s): POCBNP D-Dimer: No results for input(s): DDIMER in the last 72 hours. Hemoglobin A1C: No results for input(s): HGBA1C in the last 72 hours. Fasting Lipid Panel: No results for input(s): CHOL, HDL, LDLCALC, TRIG, CHOLHDL, LDLDIRECT in the last 72 hours. Thyroid  Function Tests:  Recent Labs  10/28/15 1348  TSH 12.601*   Anemia Panel: No results for input(s): VITAMINB12, FOLATE, FERRITIN, TIBC, IRON, RETICCTPCT in the last 72 hours.   PHYSICAL EXAM General: Well developed, well nourished, in no acute distress HEENT:  Normocephalic and atramatic Neck:  No JVD.  Lungs: Clear bilaterally to auscultation and percussion. Heart: HRRR . Normal S1 and S2 without gallops or murmurs.  Abdomen: Bowel sounds are positive, abdomen soft and non-tender  Msk:  Back normal, normal gait. Normal strength and tone for age. Extremities: No clubbing, cyanosis or edema, +darkened skin of lower legs indicative of venous insufficiency Neuro: Alert and oriented X 3. Psych:  Good affect, responds appropriately  TELEMETRY: Sinus rhythm at 90 bpm   ASSESSMENT AND PLAN: Patient  is status post mechanical fall prior to it she denies any chest pain or dizziness. He has found to have elevated CKs and rhabdomyolysis as well as mildly elevated troponins both of which are declining. He was found to be hypokalemic and had some recent nonsustained V. tach. His potassium is being repleted. An EKG this morning indicated PVCs in singles and pairs and right bundle branch block. He is being hydrated for the rhabdomyolysis. There appears to be plans for him to go to rehabilitation. Also psych has seen him and reduced some of his medications which could put him at risk for fall.  Principal Problem:   Severe recurrent major depression without psychotic features (HCC) Active Problems:   Rhabdomyolysis   Pressure ulcer   Protein-calorie malnutrition, severe   Acute delirium    Berton Bon, NP 10/30/2015 8:49 AM

## 2015-10-30 NOTE — Consult Note (Signed)
[facility] Consult   Reason for Consult:  Consult for 82 year old man with a history of depression who was in the hospital recovering after a fall that resulted in him being without food or drink probably for at least a day Referring Physician:  Posey Pronto Patient Identification: Matthew Foley MRN:  [id number] Principal Diagnosis: Severe recurrent major depression without psychotic features [facility]) Diagnosis:   Patient Active Problem List   Diagnosis Date Noted  . Pressure ulcer [L89.90] 10/29/2015  . Protein-calorie malnutrition, severe [E43] 10/29/2015  . Acute delirium [R41.0] 10/29/2015  . Severe recurrent major depression without psychotic features ([place]) [F33.2] 10/29/2015  . Rhabdomyolysis [M62.82] 10/28/2015  . Drug overdose, intentional ([place]) [T50.902A] 12/05/2014  . Arrhythmia, ventricular [I49.9] 12/05/2014  . CAD (coronary artery disease) [I25.10] 12/05/2014  . Depression [F32.9] 12/05/2014  . Major depression ([place]) [F32.9] 12/04/2014  . Suicidal behavior [F48.9] 12/04/2014  . Hypothyroid [E03.9] 12/04/2014  . Hypertension [I10] 12/04/2014    Total Time spent with patient: 20 minutes  Subjective:   Matthew Foley is a 82 y.o. male patient admitted with "I feel like I had a good day today".  Follow-up Wednesday the 10th. Patient interviewed. Chart reviewed. Spoke with hospitalist. Patient states he is feeling a little bit better. Denies any current suicidal ideation. Affect slightly brighter. He is more lucid and on topic. No new complaints.  HPI:  Patient interviewed. Chart reviewed. Labs and vitals reviewed. I also spoke with his daughter on the telephone. Patient was not a very clear historian and his daughter provided more useful information. According to the daughter she had tried to reach him by telephone over the weekend starting Saturday. When she was not able to reach him Saturday or Sunday she eventually prevailed on the manager at his  apartment complex to check on him. Monday morning they found him on the floor at his kitchen. He was brought to the emergency room and appeared to be dehydrated and malnourished. On interview today the patient is not very coherent. During a spell of nearly sitting he told me that he had slipped down out of his reclining chair onto his butt and then was unable to pull himself back up. This isn't really very consistent with his being found on the floor of the kitchen. He says that he knows that he didn't fall but can't tell me why. Patient says he thinks he was only down for about 2 hours although it seems much more likely that it was more than a day. He does admit that his mood continues to stay depressed most of the time. He admits that he still has thoughts about wishing he were dead but has not had any plan or intention of acting on it. He denies having any hallucinations now or recently. Patient is somewhat evasive about his medical health and self-care. He won't get a straight answer about how much he is eating. It appears to me that he has lost between 10 and 15 pounds just in the last couple months from what I can see in the chart. He looks like he is not eating very well. He is currently living independently. His daughters try and check up on him regularly and set his medication out. Patient denied to me that he had been using any alcohol or drugs. He does have a past history of alcohol abuse and no alcohol level was performed on admission and unfortunately. Daughter reports that she had not heard of maintaining recent comments indicating that  he was planning to kill himself although she knows that he stays depressed all the time.  Social history: Patient lives by himself. His wife died several years ago probably about 4 years ago now. He was living with his daughter for a while but now is living independently. Has been depressed pretty much ever since his wife died although it sounds like he also had  serious depression before that. Currently seems to have little activity and his only contact is with his daughters.  Medical history: History of atrial fibrillation, hypothyroidism, hypertension, currently having rhabdomyolysis from his fall as a pressure ulcer and also is malnourished.  Substance abuse history: Past history of alcohol abuse. Alcohol played a role in his last suicide attempt but he denies that he is been drinking recently.  Past Psychiatric History: Patient has a long history of recurrent depression and actually has a history of a suicide attempt that occurred even before his wife died. He had another suicide attempt just last year when he took all of his Paxil once. He is denying that he overdosed or misused any of his medicine or did anything to try and hurt himself this time. He has been on antidepressant medicine for years. He used to see a psychiatrist in the community until she no longer take his insurance. Since then he has been getting his psychiatric medicine from Dr. Juanell Fairly primary care doctor. For the last couple years at least he has been on Paxil 20 mg a day. He is currently taking Xanax 0.5 mg twice a day a dose which seems to have crept up over the last year or 2. We have some documentation that he was treated with citalopram in the past he can't remember any other medicines.  Risk to Self: Is patient at risk for suicide?: No Risk to Others:   Prior Inpatient Therapy:   Prior Outpatient Therapy:    Past Medical History:  Past Medical History  Diagnosis Date  . Depression     h/o psychiatric hospitalisations prior  . Hypertension   . Hyperlipidemia   . CAD (coronary artery disease)     at least 1 stent  . Hx of pancreatitis   . Rheumatoid arthritis ([place])   . Hypothyroidism   . GERD (gastroesophageal reflux disease)   . Atrial fibrillation [facility])     Past Surgical History  Procedure Laterality Date  . Knee replacement Bilateral   . Cholecystectomy    .  Hernia repair    . Toe amputation      Right, 2nd toe  . Joint replacement     Family History:  Family History  Problem Relation Age of Onset  . Hypertension Mother   . Hypertension Father   . CAD Father    Family Psychiatric  History: Positive for depression Social History:  History  Alcohol Use  . 0.0 oz/week  . 0 Standard drinks or equivalent per week    Comment: Margarita some times     History  Drug Use No    Social History   Social History  . Marital Status: Widowed    Spouse Name: N/A  . Number of Children: N/A  . Years of Education: N/A   Social History Main Topics  . Smoking status: Former Research scientist (life sciences)  . Smokeless tobacco: Former Systems developer     Comment: smokes cigars  . Alcohol Use: 0.0 oz/week    0 Standard drinks or equivalent per week     Comment: Margarita some times  .  Drug Use: No  . Sexual Activity: Not Asked   Other Topics Concern  . None   Social History Narrative   Stays in an apartment by himself. Ambulates with a cane.   Additional Social History:    Allergies:  No Known Allergies  Labs:  Results for orders placed or performed during the hospital encounter of 10/28/15 (from the past 48 hour(s))  Hemoglobin     Status: None   Collection Time: 10/28/15  9:03 PM  Result Value Ref Range   Hemoglobin 13.5 13.0 - 18.0 g/dL  Troponin I (q 6hr x 3)     Status: Abnormal   Collection Time: 10/29/15  1:08 AM  Result Value Ref Range   Troponin I 0.10 (H) <0.031 ng/mL    Comment: PREVIOUS RESULT CALLED BY DAS @ 4888 ON 10/28/2015 CAF        PERSISTENTLY INCREASED TROPONIN VALUES IN THE RANGE OF 0.04-0.49 ng/mL CAN BE SEEN IN:       -UNSTABLE ANGINA       -CONGESTIVE HEART FAILURE       -MYOCARDITIS       -CHEST TRAUMA       -ARRYHTHMIAS       -LATE PRESENTING MYOCARDIAL INFARCTION       -COPD   CLINICAL FOLLOW-UP RECOMMENDED.   Basic metabolic panel     Status: Abnormal   Collection Time: 10/29/15  5:19 AM  Result Value Ref Range   Sodium 144  135 - 145 mmol/L   Potassium 4.0 3.5 - 5.1 mmol/L   Chloride 107 101 - 111 mmol/L   CO2 26 22 - 32 mmol/L   Glucose, Bld 145 (H) 65 - 99 mg/dL   BUN 59 (H) 6 - 20 mg/dL   Creatinine, Ser 1.43 (H) 0.61 - 1.24 mg/dL   Calcium 9.3 8.9 - 10.3 mg/dL   GFR calc non Af Amer 45 (L) >60 mL/min   GFR calc Af Amer 52 (L) >60 mL/min    Comment: (NOTE) The eGFR has been calculated using the CKD EPI equation. This calculation has not been validated in all clinical situations. eGFR's persistently <60 mL/min signify possible Chronic Kidney Disease.    Anion gap 11 5 - 15  CBC     Status: Abnormal   Collection Time: 10/29/15  5:19 AM  Result Value Ref Range   WBC 10.2 3.8 - 10.6 K/uL   RBC 4.14 (L) 4.40 - 5.90 MIL/uL   Hemoglobin 13.4 13.0 - 18.0 g/dL   HCT 39.9 (L) 40.0 - 52.0 %   MCV 96.5 80.0 - 100.0 fL   MCH 32.4 26.0 - 34.0 pg   MCHC 33.6 32.0 - 36.0 g/dL   RDW 14.8 (H) 11.5 - 14.5 %   Platelets 191 150 - 440 K/uL  CK     Status: Abnormal   Collection Time: 10/29/15  5:19 AM  Result Value Ref Range   Total CK 1695 (H) 49 - 397 U/L  Hemoglobin     Status: None   Collection Time: 10/29/15  1:55 PM  Result Value Ref Range   Hemoglobin 13.4 13.0 - 18.0 g/dL  CK     Status: Abnormal   Collection Time: 10/30/15 10:13 AM  Result Value Ref Range   Total CK 567 (H) 49 - 397 U/L  Creatinine, serum     Status: Abnormal   Collection Time: 10/30/15 10:13 AM  Result Value Ref Range   Creatinine, Ser 1.44 (H) 0.61 - 1.24  mg/dL   GFR calc non Af Amer 44 (L) >60 mL/min   GFR calc Af Amer 51 (L) >60 mL/min    Comment: (NOTE) The eGFR has been calculated using the CKD EPI equation. This calculation has not been validated in all clinical situations. eGFR's persistently <60 mL/min signify possible Chronic Kidney Disease.     Current Facility-Administered Medications  Medication Dose Route Frequency Provider Last Rate Last Dose  . 0.9 %  sodium chloride infusion   Intravenous Continuous Fritzi Mandes, MD 75 mL/hr at 10/30/15 1055    . acetaminophen (TYLENOL) tablet 650 mg  650 mg Oral Q6H PRN Gladstone Lighter, MD       Or  . acetaminophen (TYLENOL) suppository 650 mg  650 mg Rectal Q6H PRN Gladstone Lighter, MD      . ALPRAZolam Duanne Moron) tablet 0.125 mg  0.125 mg Oral BID Gonzella Lex, MD   0.125 mg at 10/30/15 1056  . amiodarone (PACERONE) tablet 200 mg  200 mg Oral Daily Gladstone Lighter, MD   200 mg at 10/30/15 1055  . chlorproMAZINE (THORAZINE) tablet 10 mg  10 mg Oral TID PRN Gladstone Lighter, MD   10 mg at 10/30/15 0520  . docusate sodium (COLACE) capsule 100 mg  100 mg Oral BID Gladstone Lighter, MD   100 mg at 10/30/15 1055  . enoxaparin (LOVENOX) injection 40 mg  40 mg Subcutaneous Q24H Gladstone Lighter, MD   40 mg at 10/30/15 1836  . escitalopram (LEXAPRO) tablet 10 mg  10 mg Oral Daily Gonzella Lex, MD   10 mg at 10/30/15 1055  . feeding supplement (BOOST / RESOURCE BREEZE) liquid 1 Container  1 Container Oral TID BM Gladstone Lighter, MD   1 Container at 10/30/15 1400  . ferrous sulfate tablet 325 mg  325 mg Oral Daily Gladstone Lighter, MD   325 mg at 10/30/15 1055  . levothyroxine (SYNTHROID, LEVOTHROID) tablet 100 mcg  100 mcg Oral QAC breakfast Fritzi Mandes, MD   100 mcg at 10/30/15 0520  . lisinopril (PRINIVIL,ZESTRIL) tablet 2.5 mg  2.5 mg Oral Daily Gladstone Lighter, MD   2.5 mg at 10/30/15 1056  . multivitamin with minerals tablet 1 tablet  1 tablet Oral Daily Gladstone Lighter, MD   1 tablet at 10/30/15 1055  . ondansetron (ZOFRAN) tablet 4 mg  4 mg Oral Q6H PRN Gladstone Lighter, MD       Or  . ondansetron (ZOFRAN) injection 4 mg  4 mg Intravenous Q6H PRN Gladstone Lighter, MD   4 mg at 10/29/15 1139  . pantoprazole (PROTONIX) EC tablet 40 mg  40 mg Oral Daily Fritzi Mandes, MD   40 mg at 10/30/15 1055  . rOPINIRole (REQUIP) tablet 0.25 mg  0.25 mg Oral QPM Gladstone Lighter, MD   0.25 mg at 10/30/15 1836  . sodium chloride flush (NS) 0.9 % injection 3 mL  3 mL  Intravenous Q12H Gladstone Lighter, MD   3 mL at 10/30/15 1056  . tamsulosin (FLOMAX) capsule 0.4 mg  0.4 mg Oral Daily Gladstone Lighter, MD   0.4 mg at 10/30/15 1056    Musculoskeletal: Strength & Muscle Tone: decreased Gait & Station: unable to stand Patient leans: Backward  Psychiatric Specialty Exam: Review of Systems  Constitutional: Positive for malaise/fatigue.  HENT: Negative.   Eyes: Negative.   Respiratory: Negative.   Cardiovascular: Negative.   Gastrointestinal: Negative for nausea.  Musculoskeletal: Positive for joint pain and falls.  Skin: Negative.   Neurological: Positive for  weakness.  Psychiatric/Behavioral: Positive for memory loss. Negative for depression, suicidal ideas, hallucinations and substance abuse. The patient has insomnia. The patient is not nervous/anxious.     Blood pressure 121/75, pulse 80, temperature 97.8 F (36.6 C), temperature source Oral, resp. rate 19, height 6' 1"  (1.854 m), weight 69.8 kg (153 lb 14.1 oz), SpO2 97 %.Body mass index is 20.31 kg/(m^2).  General Appearance: Disheveled  Eye Contact::  Minimal  Speech:  Garbled, Slow and Slurred  Volume:  Decreased  Mood:  Euthymic  Affect:  Flat  Thought Process:  Tangential  Orientation:  Other:  He was in the hospital but couldn't tell me the correct year even with 2 tries.  Thought Content:  Negative  Suicidal Thoughts:  Yes.  without intent/plan  Homicidal Thoughts:  No  Memory:  Immediate;   Poor Recent;   Poor Remote;   Poor  Judgement:  Impaired  Insight:  Shallow  Psychomotor Activity:  Decreased  Concentration:  Poor  Recall:  Poor  Fund of Knowledge:Poor  Language: Poor  Akathisia:  No  Handed:  Right  AIMS (if indicated):     Assets:  Financial Resources/Insurance Housing Social Support  ADL's:  Impaired  Cognition: Impaired,  Mild  Sleep:      Treatment Plan Summary: Daily contact with patient to assess and evaluate symptoms and progress in treatment,  Medication management and Plan Tolerating the decrease in Xanax. Plan will be to discontinue Paxil now and leave him on the Lexapro 10 mg a day and lower dose of Xanax. Continue to follow up daily. If he is discharged on the current medicine that will be fine. Needs continued psychiatric follow-up in rehabilitation. Not currently suicidal.  Disposition: Supportive therapy provided about ongoing stressors.  Alethia Berthold, MD 10/30/2015 7:25 PM

## 2015-10-30 NOTE — Care Management Important Message (Signed)
Important Message  Patient Details  Name: Matthew Foley MRN: [id number] Date of Birth: [birth year]   Medicare Important Message Given:  Yes    [name] 10/30/2015, 10:16 AM

## 2015-10-30 NOTE — Progress Notes (Signed)
Patient ID: Matthew Foley, male   DOB: 11-Mar-1933, 82 y.o.   MRN: [id number] [facility] [place] [facility]   PATIENT NAME: Matthew Foley    MR#:  [id number]  DATE OF BIRTH:  [birth year]  SUBJECTIVE:   Came in after pt was found on the floor by apartment manager. found to have acute rhabdo.  Feels better. Some baseline confusion  REVIEW OF SYSTEMS:   Review of Systems  Constitutional: Negative for fever, chills and weight loss.  HENT: Negative for ear discharge, ear pain and nosebleeds.   Eyes: Negative for blurred vision, pain and discharge.  Respiratory: Negative for sputum production, shortness of breath, wheezing and stridor.   Cardiovascular: Negative for chest pain, palpitations, orthopnea and PND.  Gastrointestinal: Negative for nausea, vomiting, abdominal pain and diarrhea.  Genitourinary: Negative for urgency and frequency.  Musculoskeletal: Positive for joint pain and falls. Negative for back pain.  Neurological: Positive for weakness. Negative for sensory change, speech change and focal weakness.  Psychiatric/Behavioral: Negative for depression and hallucinations. The patient is not nervous/anxious.   All other systems reviewed and are negative.  Tolerating Diet:yes Tolerating PT: recommends rehab  DRUG ALLERGIES:  No Known Allergies  VITALS:  Blood pressure 126/78, pulse 80, temperature 97.9 F (36.6 C), temperature source Oral, resp. rate 20, height 6\' 1"  (1.854 m), weight 69.8 kg (153 lb 14.1 oz), SpO2 100 %.  PHYSICAL EXAMINATION:   Physical Exam  GENERAL:  82 y.o.-year-old patient lying in the bed with no acute distress. thin EYES: Pupils equal, round, reactive to light and accommodation. No scleral icterus. Extraocular muscles intact.  HEENT: Head atraumatic, normocephalic. Oropharynx and nasopharynx clear.  NECK:  Supple, no jugular venous distention. No thyroid enlargement, no tenderness.  LUNGS: Normal breath sounds  bilaterally, no wheezing, rales, rhonchi. No use of accessory muscles of respiration.  CARDIOVASCULAR: S1, S2 normal. No murmurs, rubs, or gallops.  ABDOMEN: Soft, nontender, nondistended. Bowel sounds present. No organomegaly or mass.  EXTREMITIES: No cyanosis, clubbing or edema b/l.    NEUROLOGIC: Cranial nerves II through XII are intact. No focal Motor or sensory deficits b/l.   PSYCHIATRIC:  patient is alert and oriented x 3.  SKIN: No obvious rash, lesion, or ulcer.   LABORATORY PANEL:  CBC  Recent Labs Lab 10/29/15 0519 10/29/15 1355  WBC 10.2  --   HGB 13.4 13.4  HCT 39.9*  --   PLT 191  --     Chemistries   Recent Labs Lab 10/28/15 1022  10/28/15 1904 10/29/15 0519 10/30/15 1013  NA 142  --   --  144  --   K 3.4*  --   --  4.0  --   CL 100*  --   --  107  --   CO2 26  --   --  26  --   GLUCOSE 173*  --   --  145*  --   BUN 50*  --   --  59*  --   CREATININE 1.41*  --   --  1.43* 1.44*  CALCIUM 9.8  --   --  9.3  --   MG  --   < > 1.8  --   --   AST 116*  --   --   --   --   ALT 38  --   --   --   --   ALKPHOS 51  --   --   --   --  BILITOT 1.2  --   --   --   --   < > = values in this interval not displayed. Cardiac Enzymes  Recent Labs Lab 10/29/15 0108  TROPONINI 0.10*   RADIOLOGY:  No results found. ASSESSMENT AND PLAN:   Minoru Chap is a 82 y.o. male with a known history of Atrial fibrillation not on anticoagulation, CAD, history of major depression, hypertension, arthritis who lives at home by himself presents to the hospital secondary to a fall and laying on the floor for almost 2 days now.  #1 acute metabolic encephalopathy-could be from renal failure and rhabdomyolysis and dehydration. -UA no infection -CT of the head with no acute findings. Continue fluids   #2 rhabdomyolysis-CK greater than 3000. 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  -received IV  hydration and stable renal function.  #4 hypokalemia-being replaced.  #5 history of atrial fibrillation- follows with cardiology. Replace electrolytes. -Continue amiodarone. -Aspirin on hold.  #6 hematemesis-known history of GERD. Hemoglobin is stable now. -Check hemoglobin every 8 hours. Hold aspirin. -GI consulted. Started on liquid diet -Continue IV Protonix -hgb stable.  No more vomiting NO gi MD on call Will advance diet  #7 severe protein calorie malnutrition- being monitored as outpatient. Dietary consult placed  #8 BPH-continue Flomax  #9 DVT prophylaxis-on Lovenox  #10 chronic depression seen by dr clapacs Titrating paxil and started on po lexapro  To rehab when bed available. Pt improved medically    Management plans discussed with the patient, family and they are in agreement.  CODE STATUS: full  DVT Prophylaxis: scd/teds  TOTAL TIME TAKING CARE OF THIS PATIENT:30 minutes.  >50% time spent on counselling and coordination of care    Note: This dictation was prepared with Dragon dictation along with smaller phrase technology. Any transcriptional errors that result from this process are unintentional.  Euna Armon M.D on 10/30/2015 at 12:16 PM  Between 7am to 6pm - Pager - [phone]  After 6pm go to [email or web address] - password [email or web address] [facility]  [facility]  Office  [phone]  CC: Primary care physician; Jaclyn Shaggy, MD

## 2015-10-31 DIAGNOSIS — F332 Major depressive disorder, recurrent severe without psychotic features: Secondary | ICD-10-CM

## 2015-10-31 MED ORDER — ADULT MULTIVITAMIN W/MINERALS CH: 1.0000 | ORAL_TABLET | Freq: Every day | ORAL | Status: DC

## 2015-10-31 MED ORDER — ESCITALOPRAM OXALATE 10 MG PO TABS: 10.0000 mg | ORAL_TABLET | Freq: Every day | ORAL | Status: DC

## 2015-10-31 MED ORDER — BOOST / RESOURCE BREEZE PO LIQD: 1.0000 | Freq: Three times a day (TID) | ORAL | Status: DC

## 2015-10-31 MED ORDER — ALPRAZOLAM 0.25 MG PO TABS: 0.1250 mg | ORAL_TABLET | Freq: Two times a day (BID) | ORAL | Status: DC

## 2015-10-31 NOTE — Discharge Summary (Signed)
[facility] [place] at [facility]   PATIENT NAME: Matthew Foley    MR#:  [id number]  DATE OF BIRTH:  Jun 19, 1933  DATE OF ADMISSION:  10/28/2015 ADMITTING PHYSICIAN: Enid Baas, MD  DATE OF DISCHARGE: 10/31/15  PRIMARY CARE PHYSICIAN: Jaclyn Shaggy, MD    ADMISSION DIAGNOSIS:  Dehydration [E86.0] Renal failure [N19] Pain [R52] Acute GI bleeding [K92.2] Traumatic rhabdomyolysis, initial encounter (HCC) [T79.6XXA]  DISCHARGE DIAGNOSIS:  Acute rhabdomyolysis-resolved Acute on chronic renal failure-improved (baseline creat ~1.3) H/o chronic depression HTN  SECONDARY DIAGNOSIS:   Past Medical History  Diagnosis Date  . Depression     h/o psychiatric hospitalisations prior  . Hypertension   . Hyperlipidemia   . CAD (coronary artery disease)     at least 1 stent  . Hx of pancreatitis   . Rheumatoid arthritis (HCC)   . Hypothyroidism   . GERD (gastroesophageal reflux disease)   . Atrial fibrillation [facility])     HOSPITAL COURSE:   Matthew Foley is a 82 y.o. male with a known history of Atrial fibrillation not on anticoagulation, CAD, history of major depression, hypertension, arthritis who lives at home by himself presents to the hospital secondary to a fall and laying on the floor for almost 2 days now.  #1 acute metabolic encephalopathy-could be from renal failure and rhabdomyolysis and dehydration. -UA no infection -CT of the head with no acute findings.recieved aagrressive fluids   #2 rhabdomyolysis-CK greater than 3000.  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  -received IV hydration and stable renal function.  #4 hypokalemia-being replaced.  #5 history of atrial fibrillation- follows with cardiology. Replace electrolytes. -recived amiodarone. No need for long term use of amiodarone since NSVT due to hypokalemia -Aspirin on hold.  #6 hematemesis-known history of GERD. Hemoglobin is  stable now. -No more vomiting NO gi MD on call Will advance diet asymptomatic  #7 severe protein calorie malnutrition- being monitored as outpatient. Dietary consult placed  #8 BPH-continue Flomax  #9 DVT prophylaxis-on Lovenox  #10 chronic depression seen by dr clapacs D/ced  paxil and started on po lexapro  Overall improved D/c to rehab Left msg for dter [name] to call me if needed CONSULTS OBTAINED:  Treatment Team:  Laurier Nancy, MD Audery Amel, MD  DRUG ALLERGIES:  No Known Allergies  DISCHARGE MEDICATIONS:   Current Discharge Medication List    START taking these medications   Details  !! ALPRAZolam (XANAX) 0.25 MG tablet Take 0.5 tablets (0.125 mg total) by mouth 2 (two) times daily. Qty: 30 tablet, Refills: 0    escitalopram (LEXAPRO) 10 MG tablet Take 1 tablet (10 mg total) by mouth daily. Qty: 30 tablet, Refills: 1    feeding supplement (BOOST / RESOURCE BREEZE) LIQD Take 1 Container by mouth 3 (three) times daily between meals. Qty: 30 Container, Refills: 2    Multiple Vitamin (MULTIVITAMIN WITH MINERALS) TABS tablet Take 1 tablet by mouth daily. Qty: 30 tablet, Refills: 0     !! - Potential duplicate medications found. Please discuss with provider.    CONTINUE these medications which have NOT CHANGED   Details  !! ALPRAZolam (XANAX) 0.5 MG tablet Take 0.5 mg by mouth 2 (two) times daily.    aspirin EC 81 MG EC tablet Take 1 tablet (81 mg total) by mouth daily. Qty: 30 tablet, Refills: 0    ferrous sulfate 325 (65 FE) MG tablet Take 325 mg by mouth daily.  ketoconazole (NIZORAL) 2 % shampoo Apply 1 application topically once a week.    levothyroxine (SYNTHROID, LEVOTHROID) 75 MCG tablet Take 75 mcg by mouth daily before breakfast.    lisinopril (PRINIVIL,ZESTRIL) 2.5 MG tablet Take 2.5 mg by mouth daily.    pantoprazole (PROTONIX) 40 MG tablet Take 40 mg by mouth daily.    potassium chloride SA (K-DUR,KLOR-CON) 20 MEQ tablet Take 20 mEq  by mouth 2 (two) times daily.    rOPINIRole (REQUIP) 0.25 MG tablet Take 0.25 mg by mouth every evening.    tamsulosin (FLOMAX) 0.4 MG CAPS capsule Take 0.4 mg by mouth daily.     !! - Potential duplicate medications found. Please discuss with provider.    STOP taking these medications     PARoxetine (PAXIL) 20 MG tablet         If you experience worsening of your admission symptoms, develop shortness of breath, life threatening emergency, suicidal or homicidal thoughts you must seek medical attention immediately by calling 911 or calling your MD immediately  if symptoms less severe.  You Must read complete instructions/literature along with all the possible adverse reactions/side effects for all the Medicines you take and that have been prescribed to you. Take any new Medicines after you have completely understood and accept all the possible adverse reactions/side effects.   Please note  You were cared for by a hospitalist during your hospital stay. If you have any questions about your discharge medications or the care you received while you were in the hospital after you are discharged, you can call the unit and asked to speak with the hospitalist on call if the hospitalist that took care of you is not available. Once you are discharged, your primary care physician will handle any further medical issues. Please note that NO REFILLS for any discharge medications will be authorized once you are discharged, as it is imperative that you return to your primary care physician (or establish a relationship with a primary care physician if you do not have one) for your aftercare needs so that they can reassess your need for medications and monitor your lab values. Today   SUBJECTIVE   Doing well  VITAL SIGNS:  Blood pressure 116/72, pulse 72, temperature 98.2 F (36.8 C), temperature source Oral, resp. rate 16, height 6\' 1"  (1.854 m), weight 69.8 kg (153 lb 14.1 oz), SpO2 99 %.  I/O:    Intake/Output Summary (Last 24 hours) at 10/31/15 0915 Last data filed at 10/31/15 12/31/15  Gross per 24 hour  Intake 1856.25 ml  Output      0 ml  Net 1856.25 ml    PHYSICAL EXAMINATION:  GENERAL:  82 y.o.-year-old patient lying in the bed with no acute distress.  EYES: Pupils equal, round, reactive to light and accommodation. No scleral icterus. Extraocular muscles intact.  HEENT: Head atraumatic, normocephalic. Oropharynx and nasopharynx clear.  NECK:  Supple, no jugular venous distention. No thyroid enlargement, no tenderness.  LUNGS: Normal breath sounds bilaterally, no wheezing, rales,rhonchi or crepitation. No use of accessory muscles of respiration.  CARDIOVASCULAR: S1, S2 normal. No murmurs, rubs, or gallops.  ABDOMEN: Soft, non-tender, non-distended. Bowel sounds present. No organomegaly or mass.  EXTREMITIES: No pedal edema, cyanosis, or clubbing.  NEUROLOGIC: Cranial nerves II through XII are intact. Muscle strength 5/5 in all extremities. Sensation intact. Gait not checked.  PSYCHIATRIC: The patient is alert and oriented x 3.  SKIN: No obvious rash, lesion, or ulcer.   DATA REVIEW:  CBC   Recent Labs Lab 10/29/15 0519 10/29/15 1355  WBC 10.2  --   HGB 13.4 13.4  HCT 39.9*  --   PLT 191  --     Chemistries   Recent Labs Lab 10/28/15 1022  10/28/15 1904 10/29/15 0519 10/30/15 1013  NA 142  --   --  144  --   K 3.4*  --   --  4.0  --   CL 100*  --   --  107  --   CO2 26  --   --  26  --   GLUCOSE 173*  --   --  145*  --   BUN 50*  --   --  59*  --   CREATININE 1.41*  --   --  1.43* 1.44*  CALCIUM 9.8  --   --  9.3  --   MG  --   < > 1.8  --   --   AST 116*  --   --   --   --   ALT 38  --   --   --   --   ALKPHOS 51  --   --   --   --   BILITOT 1.2  --   --   --   --   < > = values in this interval not displayed.  Microbiology Results   No results found for this or any previous visit (from the past 240 hour(s)).  RADIOLOGY:  No results  found.   Management plans discussed with the patient, family and they are in agreement.  CODE STATUS:     Code Status Orders        Start     Ordered   10/28/15 1547  Full code   Continuous     10/28/15 1547    Code Status History    Date Active Date Inactive Code Status Order ID Comments User Context   12/05/2014  6:02 AM 12/06/2014  8:06 PM Full Code [id number]  Crissie Figures, MD Inpatient   12/04/2014  3:46 PM 12/05/2014  6:02 AM Full Code [id number]  Loleta Rose, MD ED      TOTAL TIME TAKING CARE OF THIS PATIENT:40 minutes.    Johann Gascoigne M.D on 10/31/2015 at 9:15 AM  Between 7am to 6pm - Pager - [phone] After 6pm go to [email or web address] - password [email or web address] [facility]  [facility]  Office  [phone]  CC: Primary care physician; Jaclyn Shaggy, MD

## 2015-10-31 NOTE — Consult Note (Signed)
[place] [facility] Consult   Reason for Consult:  Consult for 82 year old man with a history of depression who was in the hospital recovering after a fall that resulted in him being without food or drink probably for at least a day Referring Physician:  Posey Pronto Patient Identification: Matthew Foley MRN:  [id number] Principal Diagnosis: Severe recurrent major depression without psychotic features [facility]) Diagnosis:   Patient Active Problem List   Diagnosis Date Noted  . Pressure ulcer [L89.90] 10/29/2015  . Protein-calorie malnutrition, severe [E43] 10/29/2015  . Acute delirium [R41.0] 10/29/2015  . Severe recurrent major depression without psychotic features ([place]) [F33.2] 10/29/2015  . Rhabdomyolysis [M62.82] 10/28/2015  . Drug overdose, intentional ([place]) [T50.902A] 12/05/2014  . Arrhythmia, ventricular [I49.9] 12/05/2014  . CAD (coronary artery disease) [I25.10] 12/05/2014  . Depression [F32.9] 12/05/2014  . Major depression ([place]) [F32.9] 12/04/2014  . Suicidal behavior [F48.9] 12/04/2014  . Hypothyroid [E03.9] 12/04/2014  . Hypertension [I10] 12/04/2014    Total Time spent with patient: 20 minutes  Subjective:   [name] is a 82 y.o. male patient admitted with "I feel like I had a good day today".  Follow-up Thursday the 11th. Patient seen. Chart reviewed. Patient was awake in his room when I came in to see him. Recognized me immediately. Sustained attention throughout conversation. Eye contact intermittent but reasonably good in the conversation was appropriate. Patient says he is feeling better. Less pain and less fatigue. He is denying feeling depressed. Denies any thought about harming himself or wishing to die. He says that he is eating a little bit better. He gives the impression of minimizing his symptoms of depression and concerns about the future but is perfectly willing to go to rehabilitation at this point. Denies any psychotic symptoms. He denies  anything that sounds like side effects of his medicine.  HPI:  Patient interviewed. Chart reviewed. Labs and vitals reviewed. I also spoke with his daughter on the telephone. Patient was not a very clear historian and his daughter provided more useful information. According to the daughter she had tried to reach him by telephone over the weekend starting Saturday. When she was not able to reach him Saturday or Sunday she eventually prevailed on the manager at his apartment complex to check on him. Monday morning they found him on the floor at his kitchen. He was brought to the emergency room and appeared to be dehydrated and malnourished. On interview today the patient is not very coherent. During a spell of nearly sitting he told me that he had slipped down out of his reclining chair onto his butt and then was unable to pull himself back up. This isn't really very consistent with his being found on the floor of the kitchen. He says that he knows that he didn't fall but can't tell me why. Patient says he thinks he was only down for about 2 hours although it seems much more likely that it was more than a day. He does admit that his mood continues to stay depressed most of the time. He admits that he still has thoughts about wishing he were dead but has not had any plan or intention of acting on it. He denies having any hallucinations now or recently. Patient is somewhat evasive about his medical health and self-care. He won't get a straight answer about how much he is eating. It appears to me that he has lost between 10 and 15 pounds just in the last couple months from what I  can see in the chart. He looks like he is not eating very well. He is currently living independently. His daughters try and check up on him regularly and set his medication out. Patient denied to me that he had been using any alcohol or drugs. He does have a past history of alcohol abuse and no alcohol level was performed on admission and  unfortunately. Daughter reports that she had not heard of maintaining recent comments indicating that he was planning to kill himself although she knows that he stays depressed all the time.  Social history: Patient lives by himself. His wife died several years ago probably about 4 years ago now. He was living with his daughter for a while but now is living independently. Has been depressed pretty much ever since his wife died although it sounds like he also had serious depression before that. Currently seems to have little activity and his only contact is with his daughters.  Medical history: History of atrial fibrillation, hypothyroidism, hypertension, currently having rhabdomyolysis from his fall as a pressure ulcer and also is malnourished.  Substance abuse history: Past history of alcohol abuse. Alcohol played a role in his last suicide attempt but he denies that he is been drinking recently.  Past Psychiatric History: Patient has a long history of recurrent depression and actually has a history of a suicide attempt that occurred even before his wife died. He had another suicide attempt just last year when he took all of his Paxil once. He is denying that he overdosed or misused any of his medicine or did anything to try and hurt himself this time. He has been on antidepressant medicine for years. He used to see a psychiatrist in the community until she no longer take his insurance. Since then he has been getting his psychiatric medicine from Dr. Juanell Fairly primary care doctor. For the last couple years at least he has been on Paxil 20 mg a day. He is currently taking Xanax 0.5 mg twice a day a dose which seems to have crept up over the last year or 2. We have some documentation that he was treated with citalopram in the past he can't remember any other medicines.  Risk to Self: Is patient at risk for suicide?: No Risk to Others:   Prior Inpatient Therapy:   Prior Outpatient Therapy:    Past Medical  History:  Past Medical History  Diagnosis Date  . Depression     h/o psychiatric hospitalisations prior  . Hypertension   . Hyperlipidemia   . CAD (coronary artery disease)     at least 1 stent  . Hx of pancreatitis   . Rheumatoid arthritis ([place])   . Hypothyroidism   . GERD (gastroesophageal reflux disease)   . Atrial fibrillation [facility])     Past Surgical History  Procedure Laterality Date  . Knee replacement Bilateral   . Cholecystectomy    . Hernia repair    . Toe amputation      Right, 2nd toe  . Joint replacement     Family History:  Family History  Problem Relation Age of Onset  . Hypertension Mother   . Hypertension Father   . CAD Father    Family Psychiatric  History: Positive for depression Social History:  History  Alcohol Use  . 0.0 oz/week  . 0 Standard drinks or equivalent per week    Comment: Margarita some times     History  Drug Use No    Social History  Social History  . Marital Status: Widowed    Spouse Name: N/A  . Number of Children: N/A  . Years of Education: N/A   Social History Main Topics  . Smoking status: Former Research scientist (life sciences)  . Smokeless tobacco: Former Systems developer     Comment: smokes cigars  . Alcohol Use: 0.0 oz/week    0 Standard drinks or equivalent per week     Comment: Margarita some times  . Drug Use: No  . Sexual Activity: Not Asked   Other Topics Concern  . None   Social History Narrative   Stays in an apartment by himself. Ambulates with a cane.   Additional Social History:    Allergies:  No Known Allergies  Labs:  Results for orders placed or performed during the hospital encounter of 10/28/15 (from the past 48 hour(s))  Hemoglobin     Status: None   Collection Time: 10/29/15  1:55 PM  Result Value Ref Range   Hemoglobin 13.4 13.0 - 18.0 g/dL  CK     Status: Abnormal   Collection Time: 10/30/15 10:13 AM  Result Value Ref Range   Total CK 567 (H) 49 - 397 U/L  Creatinine, serum     Status: Abnormal   Collection  Time: 10/30/15 10:13 AM  Result Value Ref Range   Creatinine, Ser 1.44 (H) 0.61 - 1.24 mg/dL   GFR calc non Af Amer 44 (L) >60 mL/min   GFR calc Af Amer 51 (L) >60 mL/min    Comment: (NOTE) The eGFR has been calculated using the CKD EPI equation. This calculation has not been validated in all clinical situations. eGFR's persistently <60 mL/min signify possible Chronic Kidney Disease.     Current Facility-Administered Medications  Medication Dose Route Frequency Provider Last Rate Last Dose  . acetaminophen (TYLENOL) tablet 650 mg  650 mg Oral Q6H PRN Gladstone Lighter, MD   650 mg at 10/30/15 2205   Or  . acetaminophen (TYLENOL) suppository 650 mg  650 mg Rectal Q6H PRN Gladstone Lighter, MD      . ALPRAZolam Duanne Moron) tablet 0.125 mg  0.125 mg Oral BID Gonzella Lex, MD   0.125 mg at 10/31/15 1009  . amiodarone (PACERONE) tablet 200 mg  200 mg Oral Daily Gladstone Lighter, MD   200 mg at 10/31/15 1008  . chlorproMAZINE (THORAZINE) tablet 10 mg  10 mg Oral TID PRN Gladstone Lighter, MD   10 mg at 10/30/15 0520  . docusate sodium (COLACE) capsule 100 mg  100 mg Oral BID Gladstone Lighter, MD   100 mg at 10/31/15 1010  . enoxaparin (LOVENOX) injection 40 mg  40 mg Subcutaneous Q24H Gladstone Lighter, MD   40 mg at 10/30/15 1836  . escitalopram (LEXAPRO) tablet 10 mg  10 mg Oral Daily Gonzella Lex, MD   10 mg at 10/31/15 1010  . feeding supplement (BOOST / RESOURCE BREEZE) liquid 1 Container  1 Container Oral TID BM Gladstone Lighter, MD   1 Container at 10/30/15 2000  . ferrous sulfate tablet 325 mg  325 mg Oral Daily Gladstone Lighter, MD   325 mg at 10/31/15 1010  . levothyroxine (SYNTHROID, LEVOTHROID) tablet 100 mcg  100 mcg Oral QAC breakfast Fritzi Mandes, MD   100 mcg at 10/31/15 0653  . lisinopril (PRINIVIL,ZESTRIL) tablet 2.5 mg  2.5 mg Oral Daily Gladstone Lighter, MD   2.5 mg at 10/31/15 1009  . multivitamin with minerals tablet 1 tablet  1 tablet Oral Daily Gladstone Lighter, MD  1 tablet at 10/31/15 1010  . ondansetron (ZOFRAN) tablet 4 mg  4 mg Oral Q6H PRN Gladstone Lighter, MD       Or  . ondansetron (ZOFRAN) injection 4 mg  4 mg Intravenous Q6H PRN Gladstone Lighter, MD   4 mg at 10/29/15 1139  . pantoprazole (PROTONIX) EC tablet 40 mg  40 mg Oral Daily Fritzi Mandes, MD   40 mg at 10/31/15 1010  . rOPINIRole (REQUIP) tablet 0.25 mg  0.25 mg Oral QPM Gladstone Lighter, MD   0.25 mg at 10/30/15 1836  . sodium chloride flush (NS) 0.9 % injection 3 mL  3 mL Intravenous Q12H Gladstone Lighter, MD   3 mL at 10/30/15 2200  . tamsulosin (FLOMAX) capsule 0.4 mg  0.4 mg Oral Daily Gladstone Lighter, MD   0.4 mg at 10/31/15 1009    Musculoskeletal: Strength & Muscle Tone: decreased Gait & Station: unable to stand Patient leans: Backward  Psychiatric Specialty Exam: Review of Systems  Constitutional: Positive for malaise/fatigue.  HENT: Negative.   Eyes: Negative.   Respiratory: Negative.   Cardiovascular: Negative.   Gastrointestinal: Negative for nausea.  Musculoskeletal: Positive for joint pain and falls.  Skin: Negative.   Neurological: Positive for weakness.  Psychiatric/Behavioral: Positive for memory loss. Negative for depression, suicidal ideas, hallucinations and substance abuse. The patient is not nervous/anxious and does not have insomnia.     Blood pressure 116/72, pulse 72, temperature 98.2 F (36.8 C), temperature source Oral, resp. rate 16, height 6' 1"  (1.854 m), weight 69.8 kg (153 lb 14.1 oz), SpO2 99 %.Body mass index is 20.31 kg/(m^2).  General Appearance: Disheveled  Eye Contact::  Minimal  Speech:  Garbled, Slow and Slurred  Volume:  Decreased  Mood:  Euthymic  Affect:  Flat  Thought Process:  Goal Directed  Orientation:  Negative  Thought Content:  Negative  Suicidal Thoughts:  No  Homicidal Thoughts:  No  Memory:  Immediate;   Poor Recent;   Poor Remote;   Poor  Judgement:  Fair  Insight:  Shallow  Psychomotor Activity:  Decreased   Concentration:  Fair  Recall:  [place]: Fair  Akathisia:  No  Handed:  Right  AIMS (if indicated):     Assets:  Financial Resources/Insurance Housing Social Support  ADL's:  Impaired  Cognition: Impaired,  Mild  Sleep:      Treatment Plan Summary: Daily contact with patient to assess and evaluate symptoms and progress in treatment, Medication management and Plan It looks like he is likely to be discharged to rehabilitation soon. I went over his medicine changes with him. We have discontinued the Paxil now and switched him over to 10 mg of Lexapro. I have cut his Xanax dose down to a quarter milligram 3 times a day. I informed the patient of this. He is frightened about stopping the medicine and I reassured him that we are not stopping it but he certainly needs to be on less than what he was taking before and he seems to be tolerating that fine. Encourage him to work on his physical rehabilitation and to be honest with people about his mood. Did some counseling with him trying to encourage him to strongly consider moving into an assisted living facility as it's clear that he's been doing a bad job taking care of himself at home. No indication for commitment or referral to inpatient psychiatry. Completed supportive counseling. No changes to medicine. I will follow-up if he  remains in the hospital.  Disposition: Supportive therapy provided about ongoing stressors.  Alethia Berthold, MD 10/31/2015 12:52 PM

## 2015-10-31 NOTE — Progress Notes (Signed)
SUBJECTIVE:   patient is much more alert and no chest pain or shortness of breath or palpitation Filed Vitals:   10/30/15 1014 10/30/15 1300 10/30/15 2032 10/31/15 0625  BP:  121/75 101/69 116/72  Pulse: 80 80 79 72  Temp:  97.8 F (36.6 C) 97.8 F (36.6 C) 98.2 F (36.8 C)  TempSrc:  Oral Oral   Resp:  19 18 16   Height:      Weight:      SpO2: 100% 97% 99% 99%    Intake/Output Summary (Last 24 hours) at 10/31/15 0911 Last data filed at 10/31/15 12/31/15  Gross per 24 hour  Intake 1856.25 ml  Output      0 ml  Net 1856.25 ml    LABS: Basic Metabolic Panel:  Recent Labs  3903 1022 10/28/15 1348 10/28/15 1904 10/29/15 0519 10/30/15 1013  NA 142  --   --  144  --   K 3.4*  --   --  4.0  --   CL 100*  --   --  107  --   CO2 26  --   --  26  --   GLUCOSE 173*  --   --  145*  --   BUN 50*  --   --  59*  --   CREATININE 1.41*  --   --  1.43* 1.44*  CALCIUM 9.8  --   --  9.3  --   MG  --  1.7 1.8  --   --    Liver Function Tests:  Recent Labs  10/28/15 1022  AST 116*  ALT 38  ALKPHOS 51  BILITOT 1.2  PROT 8.2*  ALBUMIN 3.9    Recent Labs  10/28/15 1022  LIPASE 18   CBC:  Recent Labs  10/28/15 1022  10/29/15 0519 10/29/15 1355  WBC 17.5*  --  10.2  --   NEUTROABS 15.4*  --   --   --   HGB 14.4  < > 13.4 13.4  HCT 43.4  --  39.9*  --   MCV 97.0  --  96.5  --   PLT 217  --  191  --   < > = values in this interval not displayed. Cardiac Enzymes:  Recent Labs  10/28/15 1022 10/28/15 1348 10/28/15 1904 10/29/15 0108 10/29/15 0519 10/30/15 1013  CKTOTAL 3635*  --   --   --  1695* 567*  TROPONINI 0.05* 0.13* 0.44* 0.10*  --   --    BNP: Invalid input(s): POCBNP D-Dimer: No results for input(s): DDIMER in the last 72 hours. Hemoglobin A1C: No results for input(s): HGBA1C in the last 72 hours. Fasting Lipid Panel: No results for input(s): CHOL, HDL, LDLCALC, TRIG, CHOLHDL, LDLDIRECT in the last 72 hours. Thyroid Function  Tests:  Recent Labs  10/28/15 1348  TSH 12.601*   Anemia Panel: No results for input(s): VITAMINB12, FOLATE, FERRITIN, TIBC, IRON, RETICCTPCT in the last 72 hours.   PHYSICAL EXAM General: Well developed, well nourished, in no acute distress HEENT:  Normocephalic and atramatic Neck:  No JVD.  Lungs: Clear bilaterally to auscultation and percussion. Heart: HRRR . Normal S1 and S2 without gallops or murmurs.  Abdomen: Bowel sounds are positive, abdomen soft and non-tender  Msk:  Back normal, normal gait. Normal strength and tone for age. Extremities: No clubbing, cyanosis or edema.   Neuro: Alert and oriented X 3. Psych:  Good affect, responds appropriately  TELEMETRY:  sinus rhythm  ASSESSMENT  AND PLAN:  only one episode of nonsustained ventricular tachycardia in a setting of hypokalemia. May discontinue amiodarone as probably patient will not follow-up as required in the office. Elevated troponin due to demand ischemia.  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

## 2015-10-31 NOTE — Progress Notes (Signed)
Patient has been discharged. Report given to Matthew Foley at [facility] [employer]. Alert and oriented. Vital signs stable . No signs of acute distress.. No other issues noted at this time.  Family made aware.

## 2015-11-04 ENCOUNTER — Inpatient Hospital Stay
Admission: EM | Admit: 2015-11-04 | Discharge: 2015-11-21 | DRG: 871 | Disposition: E | Payer: Medicare PPO | Attending: Internal Medicine | Admitting: Internal Medicine

## 2015-11-04 ENCOUNTER — Encounter: Payer: Self-pay | Admitting: *Deleted

## 2015-11-04 ENCOUNTER — Emergency Department: Payer: Medicare PPO

## 2015-11-04 DIAGNOSIS — E870 Hyperosmolality and hypernatremia: Secondary | ICD-10-CM | POA: Diagnosis present

## 2015-11-04 DIAGNOSIS — I429 Cardiomyopathy, unspecified: Secondary | ICD-10-CM | POA: Diagnosis present

## 2015-11-04 DIAGNOSIS — I472 Ventricular tachycardia, unspecified: Secondary | ICD-10-CM

## 2015-11-04 DIAGNOSIS — N390 Urinary tract infection, site not specified: Secondary | ICD-10-CM | POA: Diagnosis present

## 2015-11-04 DIAGNOSIS — N19 Unspecified kidney failure: Secondary | ICD-10-CM

## 2015-11-04 DIAGNOSIS — R6521 Severe sepsis with septic shock: Secondary | ICD-10-CM | POA: Diagnosis present

## 2015-11-04 DIAGNOSIS — Z95828 Presence of other vascular implants and grafts: Secondary | ICD-10-CM

## 2015-11-04 DIAGNOSIS — A419 Sepsis, unspecified organism: Secondary | ICD-10-CM

## 2015-11-04 DIAGNOSIS — Z789 Other specified health status: Secondary | ICD-10-CM

## 2015-11-04 DIAGNOSIS — J690 Pneumonitis due to inhalation of food and vomit: Secondary | ICD-10-CM | POA: Insufficient documentation

## 2015-11-04 DIAGNOSIS — Z0189 Encounter for other specified special examinations: Secondary | ICD-10-CM

## 2015-11-04 DIAGNOSIS — E875 Hyperkalemia: Secondary | ICD-10-CM | POA: Diagnosis present

## 2015-11-04 DIAGNOSIS — A4102 Sepsis due to Methicillin resistant Staphylococcus aureus: Secondary | ICD-10-CM | POA: Diagnosis not present

## 2015-11-04 DIAGNOSIS — N170 Acute kidney failure with tubular necrosis: Secondary | ICD-10-CM | POA: Diagnosis present

## 2015-11-04 DIAGNOSIS — I959 Hypotension, unspecified: Secondary | ICD-10-CM | POA: Diagnosis not present

## 2015-11-04 DIAGNOSIS — I482 Chronic atrial fibrillation: Secondary | ICD-10-CM | POA: Diagnosis present

## 2015-11-04 DIAGNOSIS — Z79899 Other long term (current) drug therapy: Secondary | ICD-10-CM

## 2015-11-04 DIAGNOSIS — R410 Disorientation, unspecified: Secondary | ICD-10-CM

## 2015-11-04 DIAGNOSIS — I080 Rheumatic disorders of both mitral and aortic valves: Secondary | ICD-10-CM | POA: Diagnosis present

## 2015-11-04 DIAGNOSIS — J9601 Acute respiratory failure with hypoxia: Secondary | ICD-10-CM | POA: Diagnosis not present

## 2015-11-04 DIAGNOSIS — I214 Non-ST elevation (NSTEMI) myocardial infarction: Secondary | ICD-10-CM | POA: Diagnosis not present

## 2015-11-04 DIAGNOSIS — D638 Anemia in other chronic diseases classified elsewhere: Secondary | ICD-10-CM | POA: Diagnosis present

## 2015-11-04 DIAGNOSIS — I10 Essential (primary) hypertension: Secondary | ICD-10-CM | POA: Diagnosis present

## 2015-11-04 DIAGNOSIS — E43 Unspecified severe protein-calorie malnutrition: Secondary | ICD-10-CM | POA: Diagnosis present

## 2015-11-04 DIAGNOSIS — Z87891 Personal history of nicotine dependence: Secondary | ICD-10-CM

## 2015-11-04 DIAGNOSIS — E785 Hyperlipidemia, unspecified: Secondary | ICD-10-CM | POA: Diagnosis present

## 2015-11-04 DIAGNOSIS — R000 Tachycardia, unspecified: Secondary | ICD-10-CM

## 2015-11-04 DIAGNOSIS — Z7982 Long term (current) use of aspirin: Secondary | ICD-10-CM

## 2015-11-04 DIAGNOSIS — E860 Dehydration: Secondary | ICD-10-CM | POA: Diagnosis present

## 2015-11-04 DIAGNOSIS — Z4659 Encounter for fitting and adjustment of other gastrointestinal appliance and device: Secondary | ICD-10-CM

## 2015-11-04 DIAGNOSIS — N179 Acute kidney failure, unspecified: Secondary | ICD-10-CM

## 2015-11-04 DIAGNOSIS — R1319 Other dysphagia: Secondary | ICD-10-CM | POA: Diagnosis present

## 2015-11-04 DIAGNOSIS — R4182 Altered mental status, unspecified: Secondary | ICD-10-CM | POA: Diagnosis present

## 2015-11-04 DIAGNOSIS — E876 Hypokalemia: Secondary | ICD-10-CM | POA: Diagnosis not present

## 2015-11-04 DIAGNOSIS — N139 Obstructive and reflux uropathy, unspecified: Secondary | ICD-10-CM | POA: Diagnosis present

## 2015-11-04 DIAGNOSIS — N320 Bladder-neck obstruction: Secondary | ICD-10-CM | POA: Diagnosis present

## 2015-11-04 DIAGNOSIS — I272 Other secondary pulmonary hypertension: Secondary | ICD-10-CM | POA: Diagnosis present

## 2015-11-04 DIAGNOSIS — L89153 Pressure ulcer of sacral region, stage 3: Secondary | ICD-10-CM | POA: Diagnosis present

## 2015-11-04 DIAGNOSIS — L89110 Pressure ulcer of right upper back, unstageable: Secondary | ICD-10-CM | POA: Diagnosis present

## 2015-11-04 DIAGNOSIS — G9341 Metabolic encephalopathy: Secondary | ICD-10-CM | POA: Diagnosis present

## 2015-11-04 DIAGNOSIS — L89210 Pressure ulcer of right hip, unstageable: Secondary | ICD-10-CM | POA: Diagnosis present

## 2015-11-04 DIAGNOSIS — R0602 Shortness of breath: Secondary | ICD-10-CM

## 2015-11-04 DIAGNOSIS — I2510 Atherosclerotic heart disease of native coronary artery without angina pectoris: Secondary | ICD-10-CM | POA: Diagnosis present

## 2015-11-04 DIAGNOSIS — K219 Gastro-esophageal reflux disease without esophagitis: Secondary | ICD-10-CM | POA: Diagnosis present

## 2015-11-04 DIAGNOSIS — I471 Supraventricular tachycardia: Secondary | ICD-10-CM | POA: Diagnosis present

## 2015-11-04 DIAGNOSIS — Z96653 Presence of artificial knee joint, bilateral: Secondary | ICD-10-CM | POA: Diagnosis present

## 2015-11-04 DIAGNOSIS — E039 Hypothyroidism, unspecified: Secondary | ICD-10-CM | POA: Diagnosis present

## 2015-11-04 DIAGNOSIS — M069 Rheumatoid arthritis, unspecified: Secondary | ICD-10-CM | POA: Diagnosis present

## 2015-11-04 DIAGNOSIS — Z6823 Body mass index (BMI) 23.0-23.9, adult: Secondary | ICD-10-CM

## 2015-11-04 DIAGNOSIS — Z66 Do not resuscitate: Secondary | ICD-10-CM | POA: Diagnosis not present

## 2015-11-04 DIAGNOSIS — J9690 Respiratory failure, unspecified, unspecified whether with hypoxia or hypercapnia: Secondary | ICD-10-CM

## 2015-11-04 DIAGNOSIS — D696 Thrombocytopenia, unspecified: Secondary | ICD-10-CM | POA: Diagnosis present

## 2015-11-04 DIAGNOSIS — Z515 Encounter for palliative care: Secondary | ICD-10-CM | POA: Diagnosis not present

## 2015-11-04 DIAGNOSIS — Z8249 Family history of ischemic heart disease and other diseases of the circulatory system: Secondary | ICD-10-CM

## 2015-11-04 LAB — BLOOD GAS, VENOUS
Acid-base deficit: 8.6 mmol/L — ABNORMAL HIGH (ref 0.0–2.0)
BICARBONATE: 16.5 meq/L — AB (ref 21.0–28.0)
PH VEN: 7.32 (ref 7.320–7.430)
Patient temperature: 37
pCO2, Ven: 32 mmHg — ABNORMAL LOW (ref 44.0–60.0)

## 2015-11-04 LAB — CBC
HCT: 35.8 % — ABNORMAL LOW (ref 40.0–52.0)
Hemoglobin: 11.8 g/dL — ABNORMAL LOW (ref 13.0–18.0)
MCH: 31.5 pg (ref 26.0–34.0)
MCHC: 32.9 g/dL (ref 32.0–36.0)
MCV: 95.6 fL (ref 80.0–100.0)
PLATELETS: 154 10*3/uL (ref 150–440)
RBC: 3.74 MIL/uL — AB (ref 4.40–5.90)
RDW: 14.8 % — ABNORMAL HIGH (ref 11.5–14.5)
WBC: 19.9 10*3/uL — AB (ref 3.8–10.6)

## 2015-11-04 MED ORDER — DEXTROSE 5 % IV SOLN: 60.0000 mg/h | Freq: Once | INTRAVENOUS | Status: DC

## 2015-11-04 MED ORDER — SODIUM CHLORIDE 0.9 % IV BOLUS (SEPSIS)
1000.0000 mL | Freq: Once | INTRAVENOUS | Status: AC
  Administered 2015-11-04: 1000 mL via INTRAVENOUS

## 2015-11-04 MED ORDER — FENTANYL CITRATE (PF) 100 MCG/2ML IJ SOLN
INTRAMUSCULAR | Status: AC
Start: 2015-11-04 — End: 2015-11-05
  Filled 2015-11-04: qty 2

## 2015-11-04 MED ORDER — SODIUM BICARBONATE 8.4 % IV SOLN
50.0000 meq | Freq: Once | INTRAVENOUS | Status: AC
  Administered 2015-11-04: 50 meq via INTRAVENOUS

## 2015-11-04 MED ORDER — IPRATROPIUM-ALBUTEROL 0.5-2.5 (3) MG/3ML IN SOLN
RESPIRATORY_TRACT | Status: AC
  Filled 2015-11-04: qty 3

## 2015-11-04 MED ORDER — AMIODARONE HCL IN DEXTROSE 360-4.14 MG/200ML-% IV SOLN
60.0000 mg/h | Freq: Once | INTRAVENOUS | Status: AC
  Administered 2015-11-04: 60 mg/h via INTRAVENOUS

## 2015-11-04 MED ORDER — CALCIUM CHLORIDE 10 % IV SOLN
1.0000 g | Freq: Once | INTRAVENOUS | Status: AC
  Administered 2015-11-04: 1 g via INTRAVENOUS

## 2015-11-04 MED ORDER — MIDAZOLAM HCL 2 MG/2ML IJ SOLN
INTRAMUSCULAR | Status: AC
  Administered 2015-11-04: 2 mg
  Filled 2015-11-04: qty 2

## 2015-11-04 NOTE — ED Notes (Signed)
Pt continues to have tremors.  Iv's in place.  Sinus tach on monitor  At 116   Family with pt.

## 2015-11-04 NOTE — ED Notes (Signed)
Pt cardioverted 100 joules  at 2326 after 2mg  versed given.   Pt tolerated well heart rate down to 122.  md at bedside.

## 2015-11-04 NOTE — ED Notes (Addendum)
Iv started stat and labs sent.  md at bedside. meds given.  No change after meds given.  Pt alert and talking.  Skin warm and dry.  Pt denies any pain.

## 2015-11-04 NOTE — ED Notes (Signed)
sinustach on monitor at 110

## 2015-11-04 NOTE — ED Notes (Signed)
Family at bedside. 

## 2015-11-04 NOTE — ED Notes (Addendum)
Pt brought in [facility] from [facility].  Pt sent to er for eval of abnormal labs.  Pt alert. md at bedside.  Pt has rapid heart rate on arrival at 170.  Pt has tremors.

## 2015-11-05 ENCOUNTER — Inpatient Hospital Stay
Admit: 2015-11-05 | Discharge: 2015-11-05 | Disposition: A | Discharge status: Home / Self Care | Payer: Medicare PPO | Attending: Cardiovascular Disease | Admitting: Cardiovascular Disease

## 2015-11-05 ENCOUNTER — Inpatient Hospital Stay: Payer: Medicare PPO

## 2015-11-05 ENCOUNTER — Encounter: Payer: Self-pay | Admitting: Internal Medicine

## 2015-11-05 DIAGNOSIS — Z87891 Personal history of nicotine dependence: Secondary | ICD-10-CM | POA: Diagnosis not present

## 2015-11-05 DIAGNOSIS — E039 Hypothyroidism, unspecified: Secondary | ICD-10-CM | POA: Diagnosis present

## 2015-11-05 DIAGNOSIS — N179 Acute kidney failure, unspecified: Secondary | ICD-10-CM | POA: Diagnosis not present

## 2015-11-05 DIAGNOSIS — N320 Bladder-neck obstruction: Secondary | ICD-10-CM | POA: Diagnosis present

## 2015-11-05 DIAGNOSIS — N19 Unspecified kidney failure: Secondary | ICD-10-CM | POA: Diagnosis present

## 2015-11-05 DIAGNOSIS — M069 Rheumatoid arthritis, unspecified: Secondary | ICD-10-CM | POA: Diagnosis present

## 2015-11-05 DIAGNOSIS — J9601 Acute respiratory failure with hypoxia: Secondary | ICD-10-CM | POA: Diagnosis not present

## 2015-11-05 DIAGNOSIS — N170 Acute kidney failure with tubular necrosis: Secondary | ICD-10-CM | POA: Diagnosis present

## 2015-11-05 DIAGNOSIS — Z6823 Body mass index (BMI) 23.0-23.9, adult: Secondary | ICD-10-CM | POA: Diagnosis not present

## 2015-11-05 DIAGNOSIS — I2510 Atherosclerotic heart disease of native coronary artery without angina pectoris: Secondary | ICD-10-CM | POA: Diagnosis present

## 2015-11-05 DIAGNOSIS — Z7982 Long term (current) use of aspirin: Secondary | ICD-10-CM | POA: Diagnosis not present

## 2015-11-05 DIAGNOSIS — R6521 Severe sepsis with septic shock: Secondary | ICD-10-CM | POA: Diagnosis present

## 2015-11-05 DIAGNOSIS — L89110 Pressure ulcer of right upper back, unstageable: Secondary | ICD-10-CM | POA: Diagnosis present

## 2015-11-05 DIAGNOSIS — N139 Obstructive and reflux uropathy, unspecified: Secondary | ICD-10-CM | POA: Diagnosis present

## 2015-11-05 DIAGNOSIS — Z8249 Family history of ischemic heart disease and other diseases of the circulatory system: Secondary | ICD-10-CM | POA: Diagnosis not present

## 2015-11-05 DIAGNOSIS — E870 Hyperosmolality and hypernatremia: Secondary | ICD-10-CM | POA: Diagnosis present

## 2015-11-05 DIAGNOSIS — I214 Non-ST elevation (NSTEMI) myocardial infarction: Secondary | ICD-10-CM | POA: Diagnosis not present

## 2015-11-05 DIAGNOSIS — I471 Supraventricular tachycardia: Secondary | ICD-10-CM | POA: Diagnosis present

## 2015-11-05 DIAGNOSIS — E860 Dehydration: Secondary | ICD-10-CM | POA: Diagnosis present

## 2015-11-05 DIAGNOSIS — J690 Pneumonitis due to inhalation of food and vomit: Secondary | ICD-10-CM | POA: Diagnosis not present

## 2015-11-05 DIAGNOSIS — L89210 Pressure ulcer of right hip, unstageable: Secondary | ICD-10-CM | POA: Diagnosis present

## 2015-11-05 DIAGNOSIS — D638 Anemia in other chronic diseases classified elsewhere: Secondary | ICD-10-CM | POA: Diagnosis present

## 2015-11-05 DIAGNOSIS — E43 Unspecified severe protein-calorie malnutrition: Secondary | ICD-10-CM | POA: Diagnosis present

## 2015-11-05 DIAGNOSIS — A419 Sepsis, unspecified organism: Secondary | ICD-10-CM | POA: Diagnosis not present

## 2015-11-05 DIAGNOSIS — E876 Hypokalemia: Secondary | ICD-10-CM | POA: Diagnosis not present

## 2015-11-05 DIAGNOSIS — I472 Ventricular tachycardia, unspecified: Secondary | ICD-10-CM

## 2015-11-05 DIAGNOSIS — G9341 Metabolic encephalopathy: Secondary | ICD-10-CM | POA: Diagnosis present

## 2015-11-05 DIAGNOSIS — R401 Stupor: Secondary | ICD-10-CM | POA: Diagnosis not present

## 2015-11-05 DIAGNOSIS — I959 Hypotension, unspecified: Secondary | ICD-10-CM | POA: Diagnosis not present

## 2015-11-05 DIAGNOSIS — L89159 Pressure ulcer of sacral region, unspecified stage: Secondary | ICD-10-CM | POA: Diagnosis not present

## 2015-11-05 DIAGNOSIS — R1319 Other dysphagia: Secondary | ICD-10-CM | POA: Diagnosis present

## 2015-11-05 DIAGNOSIS — I482 Chronic atrial fibrillation: Secondary | ICD-10-CM | POA: Diagnosis present

## 2015-11-05 DIAGNOSIS — I10 Essential (primary) hypertension: Secondary | ICD-10-CM | POA: Diagnosis present

## 2015-11-05 DIAGNOSIS — Z79899 Other long term (current) drug therapy: Secondary | ICD-10-CM | POA: Diagnosis not present

## 2015-11-05 DIAGNOSIS — A4102 Sepsis due to Methicillin resistant Staphylococcus aureus: Secondary | ICD-10-CM | POA: Diagnosis present

## 2015-11-05 DIAGNOSIS — D696 Thrombocytopenia, unspecified: Secondary | ICD-10-CM | POA: Diagnosis present

## 2015-11-05 DIAGNOSIS — Z96653 Presence of artificial knee joint, bilateral: Secondary | ICD-10-CM | POA: Diagnosis present

## 2015-11-05 DIAGNOSIS — G9340 Encephalopathy, unspecified: Secondary | ICD-10-CM | POA: Diagnosis not present

## 2015-11-05 DIAGNOSIS — R4182 Altered mental status, unspecified: Secondary | ICD-10-CM | POA: Diagnosis present

## 2015-11-05 DIAGNOSIS — I272 Other secondary pulmonary hypertension: Secondary | ICD-10-CM | POA: Diagnosis present

## 2015-11-05 DIAGNOSIS — E875 Hyperkalemia: Secondary | ICD-10-CM | POA: Diagnosis present

## 2015-11-05 DIAGNOSIS — R6520 Severe sepsis without septic shock: Secondary | ICD-10-CM | POA: Diagnosis not present

## 2015-11-05 DIAGNOSIS — Z515 Encounter for palliative care: Secondary | ICD-10-CM | POA: Diagnosis not present

## 2015-11-05 DIAGNOSIS — R531 Weakness: Secondary | ICD-10-CM | POA: Diagnosis not present

## 2015-11-05 DIAGNOSIS — K219 Gastro-esophageal reflux disease without esophagitis: Secondary | ICD-10-CM | POA: Diagnosis present

## 2015-11-05 DIAGNOSIS — R000 Tachycardia, unspecified: Secondary | ICD-10-CM

## 2015-11-05 DIAGNOSIS — Z66 Do not resuscitate: Secondary | ICD-10-CM | POA: Diagnosis not present

## 2015-11-05 DIAGNOSIS — R7881 Bacteremia: Secondary | ICD-10-CM | POA: Diagnosis not present

## 2015-11-05 DIAGNOSIS — J189 Pneumonia, unspecified organism: Secondary | ICD-10-CM | POA: Diagnosis not present

## 2015-11-05 DIAGNOSIS — N390 Urinary tract infection, site not specified: Secondary | ICD-10-CM | POA: Diagnosis present

## 2015-11-05 DIAGNOSIS — I429 Cardiomyopathy, unspecified: Secondary | ICD-10-CM | POA: Diagnosis present

## 2015-11-05 DIAGNOSIS — I4891 Unspecified atrial fibrillation: Secondary | ICD-10-CM | POA: Diagnosis not present

## 2015-11-05 DIAGNOSIS — E785 Hyperlipidemia, unspecified: Secondary | ICD-10-CM | POA: Diagnosis present

## 2015-11-05 DIAGNOSIS — I080 Rheumatic disorders of both mitral and aortic valves: Secondary | ICD-10-CM | POA: Diagnosis present

## 2015-11-05 DIAGNOSIS — L89153 Pressure ulcer of sacral region, stage 3: Secondary | ICD-10-CM | POA: Diagnosis present

## 2015-11-05 LAB — HEPARIN LEVEL (UNFRACTIONATED)
HEPARIN UNFRACTIONATED: 0.11 [IU]/mL — AB (ref 0.30–0.70)
Heparin Unfractionated: 0.46 IU/mL (ref 0.30–0.70)

## 2015-11-05 LAB — COMPREHENSIVE METABOLIC PANEL
ALT: 31 U/L (ref 17–63)
AST: 28 U/L (ref 15–41)
Albumin: 2.5 g/dL — ABNORMAL LOW (ref 3.5–5.0)
Alkaline Phosphatase: 69 U/L (ref 38–126)
Anion gap: 16 — ABNORMAL HIGH (ref 5–15)
BILIRUBIN TOTAL: 0.8 mg/dL (ref 0.3–1.2)
BUN: 127 mg/dL — ABNORMAL HIGH (ref 6–20)
CHLORIDE: 105 mmol/L (ref 101–111)
CO2: 16 mmol/L — ABNORMAL LOW (ref 22–32)
CREATININE: 8.6 mg/dL — AB (ref 0.61–1.24)
Calcium: 8.7 mg/dL — ABNORMAL LOW (ref 8.9–10.3)
GFR, EST AFRICAN AMERICAN: 6 mL/min — AB (ref >60)
GFR, EST NON AFRICAN AMERICAN: 5 mL/min — AB (ref >60)
Glucose, Bld: 138 mg/dL — ABNORMAL HIGH (ref 65–99)
POTASSIUM: 6.3 mmol/L — AB (ref 3.5–5.1)
Sodium: 137 mmol/L (ref 135–145)
TOTAL PROTEIN: 6 g/dL — AB (ref 6.5–8.1)

## 2015-11-05 LAB — URINALYSIS COMPLETE WITH MICROSCOPIC (ARMC ONLY)
Bilirubin Urine: NEGATIVE
GLUCOSE, UA: NEGATIVE mg/dL
Ketones, ur: NEGATIVE mg/dL
Leukocytes, UA: NEGATIVE
NITRITE: NEGATIVE
Protein, ur: NEGATIVE mg/dL
SPECIFIC GRAVITY, URINE: 1.005 (ref 1.005–1.030)
pH: 6 (ref 5.0–8.0)

## 2015-11-05 LAB — LIPID PANEL
CHOL/HDL RATIO: 3.1 ratio
Cholesterol: 95 mg/dL (ref 0–200)
HDL: 31 mg/dL — ABNORMAL LOW (ref >40)
LDL Cholesterol: 46 mg/dL (ref 0–99)
Triglycerides: 92 mg/dL (ref <150)
VLDL: 18 mg/dL (ref 0–40)

## 2015-11-05 LAB — CBC
HEMATOCRIT: 32.4 % — AB (ref 40.0–52.0)
Hemoglobin: 10.9 g/dL — ABNORMAL LOW (ref 13.0–18.0)
MCH: 32.1 pg (ref 26.0–34.0)
MCHC: 33.7 g/dL (ref 32.0–36.0)
MCV: 95.3 fL (ref 80.0–100.0)
PLATELETS: 135 10*3/uL — AB (ref 150–440)
RBC: 3.4 MIL/uL — ABNORMAL LOW (ref 4.40–5.90)
RDW: 14.7 % — AB (ref 11.5–14.5)
WBC: 20.4 10*3/uL — ABNORMAL HIGH (ref 3.8–10.6)

## 2015-11-05 LAB — APTT: APTT: 35 s (ref 24–36)

## 2015-11-05 LAB — BASIC METABOLIC PANEL
Anion gap: 14 (ref 5–15)
Anion gap: 13 (ref 5–15)
BUN: 133 mg/dL — ABNORMAL HIGH (ref 6–20)
BUN: 114 mg/dL — AB (ref 6–20)
CALCIUM: 8.9 mg/dL (ref 8.9–10.3)
CALCIUM: 8 mg/dL — AB (ref 8.9–10.3)
CO2: 15 mmol/L — AB (ref 22–32)
CO2: 15 mmol/L — AB (ref 22–32)
CREATININE: 8.6 mg/dL — AB (ref 0.61–1.24)
CREATININE: 6.11 mg/dL — AB (ref 0.61–1.24)
Chloride: 108 mmol/L (ref 101–111)
Chloride: 110 mmol/L (ref 101–111)
GFR calc Af Amer: 9 mL/min — ABNORMAL LOW (ref >60)
GFR calc non Af Amer: 8 mL/min — ABNORMAL LOW (ref >60)
GFR, EST AFRICAN AMERICAN: 6 mL/min — AB (ref >60)
GFR, EST NON AFRICAN AMERICAN: 5 mL/min — AB (ref >60)
GLUCOSE: 145 mg/dL — AB (ref 65–99)
Glucose, Bld: 154 mg/dL — ABNORMAL HIGH (ref 65–99)
Potassium: 6 mmol/L — ABNORMAL HIGH (ref 3.5–5.1)
Potassium: 3.9 mmol/L (ref 3.5–5.1)
Sodium: 137 mmol/L (ref 135–145)
Sodium: 138 mmol/L (ref 135–145)

## 2015-11-05 LAB — TROPONIN I
TROPONIN I: 0.3 ng/mL — AB (ref <0.031)
TROPONIN I: 0.62 ng/mL — AB (ref <0.031)
TROPONIN I: 3.16 ng/mL — AB (ref <0.031)
TROPONIN I: 2.82 ng/mL — AB (ref <0.031)

## 2015-11-05 LAB — GLUCOSE, CAPILLARY: GLUCOSE-CAPILLARY: 142 mg/dL — AB (ref 65–99)

## 2015-11-05 LAB — T4, FREE: Free T4: 0.59 ng/dL — ABNORMAL LOW (ref 0.61–1.12)

## 2015-11-05 LAB — PROTIME-INR
INR: 1.38
Prothrombin Time: 17.1 seconds — ABNORMAL HIGH (ref 11.4–15.0)

## 2015-11-05 LAB — ECHOCARDIOGRAM COMPLETE
HEIGHTINCHES: 72 in
WEIGHTICAEL: 2880 [oz_av]

## 2015-11-05 LAB — MAGNESIUM: MAGNESIUM: 1.5 mg/dL — AB (ref 1.7–2.4)

## 2015-11-05 LAB — LACTIC ACID, PLASMA
LACTIC ACID, VENOUS: 1.4 mmol/L (ref 0.5–2.0)
Lactic Acid, Venous: 1.2 mmol/L (ref 0.5–2.0)

## 2015-11-05 LAB — VITAMIN B12: Vitamin B-12: 417 pg/mL (ref 180–914)

## 2015-11-05 LAB — MRSA PCR SCREENING: MRSA by PCR: POSITIVE — AB

## 2015-11-05 MED ORDER — AMIODARONE HCL IN DEXTROSE 360-4.14 MG/200ML-% IV SOLN
60.0000 mg/h | INTRAVENOUS | Status: DC
  Administered 2015-11-05: 60 mg/h via INTRAVENOUS
  Filled 2015-11-05: qty 200

## 2015-11-05 MED ORDER — MUPIROCIN CALCIUM 2 % EX CREA: TOPICAL_CREAM | Freq: Two times a day (BID) | CUTANEOUS | Status: DC

## 2015-11-05 MED ORDER — TAMSULOSIN HCL 0.4 MG PO CAPS
0.4000 mg | ORAL_CAPSULE | Freq: Every day | ORAL | Status: DC
  Administered 2015-11-06: 0.4 mg via ORAL
  Filled 2015-11-05: qty 1

## 2015-11-05 MED ORDER — ASPIRIN EC 81 MG PO TBEC
81.0000 mg | DELAYED_RELEASE_TABLET | Freq: Every day | ORAL | Status: DC
  Administered 2015-11-06: 81 mg via ORAL
  Filled 2015-11-05 (×2): qty 1

## 2015-11-05 MED ORDER — SODIUM POLYSTYRENE SULFONATE 15 GM/60ML PO SUSP
30.0000 g | Freq: Once | ORAL | Status: AC
  Administered 2015-11-05: 30 g via ORAL
  Filled 2015-11-05: qty 120

## 2015-11-05 MED ORDER — ROPINIROLE HCL 0.25 MG PO TABS
0.2500 mg | ORAL_TABLET | Freq: Every evening | ORAL | Status: DC
  Administered 2015-11-06: 0.25 mg via ORAL
  Filled 2015-11-05: qty 1

## 2015-11-05 MED ORDER — SODIUM CHLORIDE 0.9 % IV SOLN
INTRAVENOUS | Status: DC
  Administered 2015-11-05 (×2): via INTRAVENOUS

## 2015-11-05 MED ORDER — ONDANSETRON HCL 4 MG PO TABS: 4.0000 mg | ORAL_TABLET | Freq: Four times a day (QID) | ORAL | Status: DC | PRN

## 2015-11-05 MED ORDER — ACETAMINOPHEN 325 MG PO TABS
650.0000 mg | ORAL_TABLET | Freq: Four times a day (QID) | ORAL | Status: DC | PRN
  Administered 2015-11-08 – 2015-11-13 (×2): 650 mg via ORAL
  Filled 2015-11-05 (×2): qty 2

## 2015-11-05 MED ORDER — ALPRAZOLAM 0.25 MG PO TABS
0.2500 mg | ORAL_TABLET | Freq: Two times a day (BID) | ORAL | Status: DC
  Administered 2015-11-06: 0.25 mg via ORAL
  Filled 2015-11-05: qty 1

## 2015-11-05 MED ORDER — SIMVASTATIN 20 MG PO TABS: 20.0000 mg | ORAL_TABLET | Freq: Every day | ORAL | Status: DC

## 2015-11-05 MED ORDER — HEPARIN (PORCINE) IN NACL 100-0.45 UNIT/ML-% IJ SOLN
1400.0000 [IU]/h | INTRAMUSCULAR | Status: DC
  Administered 2015-11-05: 1400 [IU]/h via INTRAVENOUS
  Administered 2015-11-05: 1000 [IU]/h via INTRAVENOUS
  Administered 2015-11-06: 1400 [IU]/h via INTRAVENOUS
  Filled 2015-11-05 (×3): qty 250

## 2015-11-05 MED ORDER — MAGNESIUM SULFATE 2 GM/50ML IV SOLN
2.0000 g | Freq: Once | INTRAVENOUS | Status: AC
  Administered 2015-11-06: 2 g via INTRAVENOUS
  Filled 2015-11-05: qty 50

## 2015-11-05 MED ORDER — BOOST / RESOURCE BREEZE PO LIQD: 1.0000 | Freq: Three times a day (TID) | ORAL | Status: DC

## 2015-11-05 MED ORDER — ALPRAZOLAM 0.5 MG PO TABS: 0.5000 mg | ORAL_TABLET | Freq: Two times a day (BID) | ORAL | Status: DC

## 2015-11-05 MED ORDER — DILTIAZEM HCL 25 MG/5ML IV SOLN
10.0000 mg | Freq: Once | INTRAVENOUS | Status: AC
  Administered 2015-11-05: 10 mg via INTRAVENOUS

## 2015-11-05 MED ORDER — DILTIAZEM HCL 25 MG/5ML IV SOLN
INTRAVENOUS | Status: AC
  Administered 2015-11-05: 10 mg via INTRAVENOUS
  Filled 2015-11-05: qty 5

## 2015-11-05 MED ORDER — ACETAMINOPHEN 650 MG RE SUPP
650.0000 mg | Freq: Four times a day (QID) | RECTAL | Status: DC | PRN
  Administered 2015-11-06 – 2015-11-14 (×2): 650 mg via RECTAL
  Filled 2015-11-05 (×2): qty 1

## 2015-11-05 MED ORDER — LEVOTHYROXINE SODIUM 75 MCG PO TABS: 75.0000 ug | ORAL_TABLET | Freq: Every day | ORAL | Status: DC

## 2015-11-05 MED ORDER — AMIODARONE HCL IN DEXTROSE 360-4.14 MG/200ML-% IV SOLN
30.0000 mg/h | INTRAVENOUS | Status: DC
  Administered 2015-11-06: 30 mg/h via INTRAVENOUS
  Administered 2015-11-06: 59.94 mg/h via INTRAVENOUS
  Administered 2015-11-07 – 2015-11-12 (×10): 30 mg/h via INTRAVENOUS
  Filled 2015-11-05 (×15): qty 200

## 2015-11-05 MED ORDER — HEPARIN BOLUS VIA INFUSION
4000.0000 [IU] | Freq: Once | INTRAVENOUS | Status: AC
  Administered 2015-11-05: 4000 [IU] via INTRAVENOUS
  Filled 2015-11-05: qty 4000

## 2015-11-05 MED ORDER — HEPARIN BOLUS VIA INFUSION
2400.0000 [IU] | Freq: Once | INTRAVENOUS | Status: AC
  Administered 2015-11-05: 2400 [IU] via INTRAVENOUS
  Filled 2015-11-05: qty 2400

## 2015-11-05 MED ORDER — ONDANSETRON HCL 4 MG/2ML IJ SOLN: 4.0000 mg | Freq: Four times a day (QID) | INTRAMUSCULAR | Status: DC | PRN

## 2015-11-05 MED ORDER — ESCITALOPRAM OXALATE 10 MG PO TABS
10.0000 mg | ORAL_TABLET | Freq: Every day | ORAL | Status: DC
  Administered 2015-11-06: 10 mg via ORAL
  Filled 2015-11-05: qty 1

## 2015-11-05 MED ORDER — PANTOPRAZOLE SODIUM 40 MG PO TBEC
40.0000 mg | DELAYED_RELEASE_TABLET | Freq: Every day | ORAL | Status: DC
  Administered 2015-11-06: 40 mg via ORAL
  Filled 2015-11-05: qty 1

## 2015-11-05 MED ORDER — SODIUM CHLORIDE 0.9% FLUSH
3.0000 mL | Freq: Two times a day (BID) | INTRAVENOUS | Status: DC
  Administered 2015-11-05 – 2015-11-12 (×12): 3 mL via INTRAVENOUS

## 2015-11-05 MED ORDER — METOPROLOL TARTRATE 5 MG/5ML IV SOLN
5.0000 mg | INTRAVENOUS | Status: DC | PRN
  Administered 2015-11-05 – 2015-11-06 (×3): 5 mg via INTRAVENOUS
  Filled 2015-11-05 (×3): qty 5

## 2015-11-05 MED ORDER — MUPIROCIN 2 % EX OINT
TOPICAL_OINTMENT | Freq: Two times a day (BID) | CUTANEOUS | Status: DC
  Administered 2015-11-05 – 2015-11-06 (×2): via NASAL
  Administered 2015-11-06: 1 via NASAL
  Administered 2015-11-06 – 2015-11-14 (×16): via NASAL
  Filled 2015-11-05: qty 22

## 2015-11-05 MED ORDER — FERROUS SULFATE 325 (65 FE) MG PO TABS
325.0000 mg | ORAL_TABLET | Freq: Every day | ORAL | Status: DC
  Administered 2015-11-06: 325 mg via ORAL
  Filled 2015-11-05: qty 1

## 2015-11-05 NOTE — Progress Notes (Signed)
Noted elevated troponins,but not candidate for invasive proceedures.

## 2015-11-05 NOTE — Progress Notes (Signed)
Dr. Juliene Pina aware that all PO meds not given this morning. Is waiting for swallow study.Dr Juliene Pina to order.

## 2015-11-05 NOTE — Progress Notes (Signed)
ANTICOAGULATION CONSULT NOTE - Initial Consult  Pharmacy Consult for heparin drip Indication: chest pain/ACS  No Known Allergies  Patient Measurements: Height: 6' (182.9 cm) Weight: 180 lb (81.647 kg) IBW/kg (Calculated) : 77.6 Heparin Dosing Weight: 82kg  Vital Signs: Temp: 97 F (36.1 C) (05/16 0320) Temp Source: Oral (05/16 0320) BP: 115/92 mmHg (05/16 0320) Pulse Rate: 102 (05/16 0320)  Labs:  Recent Labs  10/29/2015 2320  HGB 11.8*  HCT 35.8*  PLT 154  APTT 35  LABPROT 17.1*  INR 1.38  CREATININE 8.60*  TROPONINI 0.30*    Estimated Creatinine Clearance: 7.5 mL/min (by C-G formula based on Cr of 8.6).   Medical History: Past Medical History  Diagnosis Date  . Depression     h/o psychiatric hospitalisations prior  . Hypertension   . Hyperlipidemia   . CAD (coronary artery disease)     at least 1 stent  . Hx of pancreatitis   . Rheumatoid arthritis (HCC)   . Hypothyroidism   . GERD (gastroesophageal reflux disease)   . Atrial fibrillation (HCC)     Medications:  No anticoagulation in PTA meds.  Assessment:  Goal of Therapy:  Heparin level 0.3-0.7 units/ml Monitor platelets by anticoagulation protocol: Yes   Plan:  4000 unit bolus and initial rate of 1000 units/hr. First heparin level 8 hours after start of infusion.  Chistina Roston S 11/05/2015,3:45 AM

## 2015-11-05 NOTE — Progress Notes (Deleted)
CRITICAL VALUE ALERT  Critical value received: Phos <1.0  Date of notification:  11/05/15  Time of notification: 0500  Critical value read back:Yes.    Nurse who received alert:  Imma RN  MD notified (1st page):  E link Doc  Time of first page:  0510  MD notified (2nd page):no  Time of second page:no  Responding MD:  Casper Harrison RN  Time MD responded:  [phone]

## 2015-11-05 NOTE — Progress Notes (Signed)
     ICD-9-CM ICD-10-CM   1. Acute renal failure, unspecified acute renal failure type (HCC) 584.9 N17.9   2. Altered mental status, unspecified altered mental status type 780.97 R41.82   3. Wide-complex tachycardia (HCC) 427.1 I47.2   4. Renal failure 586 N19 US Renal     US Renal     Patient Active Problem List   Diagnosis Date Noted  . Wide-complex tachycardia (HCC) 11/05/2015  . Altered mental status 11/05/2015  . Acute renal failure (ARF) (HCC) 11/05/2015  . Ventricular tachyarrhythmia (HCC) 11/05/2015  . Pressure ulcer 10/29/2015  . Protein-calorie malnutrition, severe 10/29/2015  . Acute delirium 10/29/2015  . Severe recurrent major depression without psychotic features (HCC) 10/29/2015  . Rhabdomyolysis 10/28/2015  . Drug overdose, intentional (HCC) 12/05/2014  . Arrhythmia, ventricular 12/05/2014  . CAD (coronary artery disease) 12/05/2014  . Depression 12/05/2014  . Major depression (HCC) 12/04/2014  . Suicidal behavior 12/04/2014  . Hypothyroid 12/04/2014  . Hypertension 12/04/2014      PULMONARY  Recent Labs Lab Nov 12, 2015 2345  HCO3 16.5*    CBC  Recent Labs Lab 10/29/15 1355 11-12-15 2320 11/05/15 0350  HGB 13.4 11.8* 10.9*  HCT  --  35.8* 32.4*  WBC  --  19.9* 20.4*  PLT  --  154 135*    COAGULATION  Recent Labs Lab Nov 12, 2015 2320  INR 1.38    CARDIAC   Recent Labs Lab 11-12-2015 2320 11/05/15 0350  TROPONINI 0.30* 0.62*   No results for input(s): PROBNP in the last 168 hours.   CHEMISTRY  Recent Labs Lab 10/30/15 1013 11-12-2015 2320 11/05/15 0350  NA  --  137 137  K  --  6.3* 6.0*  CL  --  105 108  CO2  --  16* 15*  GLUCOSE  --  138* 154*  BUN  --  127* 133*  CREATININE 1.44* 8.60* 8.60*  CALCIUM  --  8.7* 8.9   Estimated Creatinine Clearance: 7.5 mL/min (by C-G formula based on Cr of 8.6).   LIVER  Recent Labs Lab Nov 12, 2015 2320  AST 28  ALT 31  ALKPHOS 69  BILITOT 0.8  PROT 6.0*  ALBUMIN 2.5*  INR 1.38      INFECTIOUS  Recent Labs Lab 2015/11/12 2320 11/05/15 0336  LATICACIDVEN 1.4 1.2     ENDOCRINE CBG (last 3)   Recent Labs  11/05/15 0321  GLUCAP 142*         IMAGING x48h  - image(s) personally visualized  -   highlighted in bold Dg Chest Portable 1 View  11/05/2015  CLINICAL DATA:  82 year old male with tachycardia EXAM: PORTABLE CHEST 1 VIEW COMPARISON:  Chest radiograph dated 10/28/2015 FINDINGS: Single portable view of the chest demonstrates emphysematous changes of the lungs. There is no focal consolidation, pleural effusion, or pneumothorax. The cardiac silhouette is within normal limits with no acute osseous pathology. IMPRESSION: No active disease. Electronically Signed   By: Elgie Collard M.D.   On: 11/05/2015 00:30     Above reviewed  Cam care exam done   Nothing new to offer from eMD at this point 5:23 AM 11/05/2015   Dr. Kalman Shan, M.D., [facility].C.P Pulmonary and Critical Care Medicine Staff Physician [facility] [place] [facility] Pager: [phone], If no answer or between  15:00h - 7:00h: call [phone]  11/05/2015 5:23 AM

## 2015-11-05 NOTE — Progress Notes (Signed)
Dr. Welton Flakes notified of elevated troponins. No new orders at this time.

## 2015-11-05 NOTE — ED Notes (Signed)
Pt waiting on admission.

## 2015-11-05 NOTE — ED Notes (Signed)
Lab called with troponin 0.30  Dr Zenda Alpers aware

## 2015-11-05 NOTE — Progress Notes (Signed)
Chaplain rounded the unit and provided a compassionate presence and support for the patient through silent prayer. Patient appeared to be sleeping. Advance Directive information was left. Jefm Petty [phone]

## 2015-11-05 NOTE — H&P (Addendum)
[facility] [place] at [facility]   PATIENT NAME: Matthew Foley    MR#:  [id number]  DATE OF BIRTH:  02-01-1933  DATE OF ADMISSION:  10/24/2015  PRIMARY CARE PHYSICIAN: Jaclyn Shaggy, MD   REQUESTING/REFERRING PHYSICIAN:   CHIEF COMPLAINT:   Chief Complaint  Patient presents with  . Abnormal Lab    HISTORY OF PRESENT ILLNESS: Matthew Foley  is a 82 y.o. male with a known history of Hypertension, hyperlipidemia, coronary artery disease, rheumatoid arthritis, hypothyroidism, atrial fibrillation presented to the emergency room for abnormal blood work and confusion. Patient was recently admitted to our hospital a week ago for fall and dehydration and he was discharged to rehabilitation facility. Since Saturday had some nausea and vomiting. He was confused for the last 1 day and he also had some increased tremors in both upper extremities.Patient was evaluated in the emergency room and was found to be in renal failure with elevated creatinine.His potassium level was also high. EKG in the emergency room showed wide complex tachycardia with rate of 170 bpm. Patient was shocked and cardioverted in the emergency room by ER physician and later on started on IV amiodarone drip. Not much history could be obtained from the patient, has the appears to be a little confused and not completely oriented to time place and person. Family was available at bedside. Patient was given IV calcium chloride, oral Kayexalate in the emergency room for hyperkalemia.  PAST MEDICAL HISTORY:   Past Medical History  Diagnosis Date  . Depression     h/o psychiatric hospitalisations prior  . Hypertension   . Hyperlipidemia   . CAD (coronary artery disease)     at least 1 stent  . Hx of pancreatitis   . Rheumatoid arthritis (HCC)   . Hypothyroidism   . GERD (gastroesophageal reflux disease)   . Atrial fibrillation (HCC)     PAST SURGICAL HISTORY: Past Surgical History  Procedure Laterality  Date  . Knee replacement Bilateral   . Cholecystectomy    . Hernia repair    . Toe amputation      Right, 2nd toe  . Joint replacement      SOCIAL HISTORY:  Social History  Substance Use Topics  . Smoking status: Former Games developer  . Smokeless tobacco: Former Neurosurgeon     Comment: smokes cigars  . Alcohol Use: No     Comment: Margarita some times    FAMILY HISTORY:  Family History  Problem Relation Age of Onset  . Hypertension Mother   . Hypertension Father   . CAD Father     DRUG ALLERGIES: No Known Allergies  REVIEW OF SYSTEMS:  Could not be obtained completely as patient is confused.   MEDICATIONS AT HOME:  Prior to Admission medications   Medication Sig Start Date End Date Taking? Authorizing Provider  acetaminophen (TYLENOL) 650 MG CR tablet Take 650 mg by mouth every 8 (eight) hours as needed for pain.   Yes Historical Provider, MD  ALPRAZolam Prudy Feeler) 0.5 MG tablet Take 0.5 mg by mouth 2 (two) times daily.   Yes Historical Provider, MD  aspirin EC 81 MG EC tablet Take 1 tablet (81 mg total) by mouth daily. 12/06/14  Yes Altamese Dilling, MD  escitalopram (LEXAPRO) 10 MG tablet Take 1 tablet (10 mg total) by mouth daily. 10/31/15  Yes Enedina Finner, MD  feeding supplement (BOOST / RESOURCE BREEZE) LIQD Take 1 Container by mouth 3 (three) times daily between meals. 10/31/15  Yes  Enedina Finner, MD  ferrous sulfate 325 (65 FE) MG tablet Take 325 mg by mouth daily.   Yes Historical Provider, MD  ketoconazole (NIZORAL) 2 % shampoo Apply 1 application topically once a week.   Yes Historical Provider, MD  levothyroxine (SYNTHROID, LEVOTHROID) 75 MCG tablet Take 75 mcg by mouth daily before breakfast.   Yes Historical Provider, MD  lisinopril (PRINIVIL,ZESTRIL) 2.5 MG tablet Take 2.5 mg by mouth daily.   Yes Historical Provider, MD  Multiple Vitamin (MULTIVITAMIN WITH MINERALS) TABS tablet Take 1 tablet by mouth daily. 10/31/15  Yes Enedina Finner, MD  pantoprazole (PROTONIX) 40 MG tablet  Take 40 mg by mouth daily.   Yes Historical Provider, MD  potassium chloride SA (K-DUR,KLOR-CON) 20 MEQ tablet Take 20 mEq by mouth 2 (two) times daily.   Yes Historical Provider, MD  rOPINIRole (REQUIP) 0.25 MG tablet Take 0.25 mg by mouth every evening.   Yes Historical Provider, MD  tamsulosin (FLOMAX) 0.4 MG CAPS capsule Take 0.4 mg by mouth daily.   Yes Historical Provider, MD  ALPRAZolam (XANAX) 0.25 MG tablet Take 0.5 tablets (0.125 mg total) by mouth 2 (two) times daily. 10/31/15   Enedina Finner, MD      PHYSICAL EXAMINATION:   VITAL SIGNS: Blood pressure 137/91, pulse 107, resp. rate 29, height 6' (1.829 m), weight 81.647 kg (180 lb), SpO2 91 %.  GENERAL:  82 y.o.-year-old patient lying in the bed disoriented.  EYES: Pupils equal, round, reactive to light and accommodation. No scleral icterus. Extraocular muscles intact.  HEENT: Head atraumatic, normocephalic. Oropharynx dry and nasopharynx clear.  NECK:  Supple, no jugular venous distention. No thyroid enlargement, no tenderness.  LUNGS: Normal breath sounds bilaterally, no wheezing, rales,rhonchi or crepitation. No use of accessory muscles of respiration.  CARDIOVASCULAR: S1, S2 tachycardia noted. No murmurs, rubs, or gallops.  ABDOMEN: Soft, nontender, nondistended. Bowel sounds present. No organomegaly or mass.  EXTREMITIES: No pedal edema, cyanosis, or clubbing.  NEUROLOGIC: Not completely oriented to time,place and person. Moves all extremities. PSYCHIATRIC: could not be assessed. SKIN: No obvious rash, lesion, or ulcer.   LABORATORY PANEL:   CBC  Recent Labs Lab 10/29/15 0519 10/29/15 1355 10/21/2015 2320  WBC 10.2  --  19.9*  HGB 13.4 13.4 11.8*  HCT 39.9*  --  35.8*  PLT 191  --  154  MCV 96.5  --  95.6  MCH 32.4  --  31.5  MCHC 33.6  --  32.9  RDW 14.8*  --  14.8*   ------------------------------------------------------------------------------------------------------------------  Chemistries   Recent  Labs Lab 10/29/15 0519 10/30/15 1013 10/23/2015 2320  NA 144  --  137  K 4.0  --  6.3*  CL 107  --  105  CO2 26  --  16*  GLUCOSE 145*  --  138*  BUN 59*  --  127*  CREATININE 1.43* 1.44* 8.60*  CALCIUM 9.3  --  8.7*  AST  --   --  28  ALT  --   --  31  ALKPHOS  --   --  69  BILITOT  --   --  0.8   ------------------------------------------------------------------------------------------------------------------ estimated creatinine clearance is 7.5 mL/min (by C-G formula based on Cr of 8.6). ------------------------------------------------------------------------------------------------------------------ No results for input(s): TSH, T4TOTAL, T3FREE, THYROIDAB in the last 72 hours.  Invalid input(s): FREET3   Coagulation profile No results for input(s): INR, PROTIME in the last 168 hours. ------------------------------------------------------------------------------------------------------------------- No results for input(s): DDIMER in the last 72 hours. -------------------------------------------------------------------------------------------------------------------  Cardiac Enzymes  Recent Labs Lab 26-Nov-2015 2320  TROPONINI 0.30*   ------------------------------------------------------------------------------------------------------------------ Invalid input(s): POCBNP  ---------------------------------------------------------------------------------------------------------------  Urinalysis    Component Value Date/Time   COLORURINE YELLOW* 10/28/2015 1319   COLORURINE Amber 01/16/2014 1003   APPEARANCEUR CLEAR* 10/28/2015 1319   APPEARANCEUR Clear 01/16/2014 1003   LABSPEC 1.020 10/28/2015 1319   LABSPEC >1.060 01/16/2014 1003   PHURINE 5.0 10/28/2015 1319   PHURINE 7.0 01/16/2014 1003   GLUCOSEU NEGATIVE 10/28/2015 1319   GLUCOSEU Negative 01/16/2014 1003   HGBUR 3+* 10/28/2015 1319   HGBUR Negative 01/16/2014 1003   BILIRUBINUR NEGATIVE 10/28/2015 1319    BILIRUBINUR Negative 01/16/2014 1003   KETONESUR NEGATIVE 10/28/2015 1319   KETONESUR Trace 01/16/2014 1003   PROTEINUR 100* 10/28/2015 1319   PROTEINUR 30 mg/dL 32/54/9826 4158   NITRITE NEGATIVE 10/28/2015 1319   NITRITE Negative 01/16/2014 1003   LEUKOCYTESUR NEGATIVE 10/28/2015 1319   LEUKOCYTESUR Negative 01/16/2014 1003     RADIOLOGY: Dg Chest Portable 1 View  11/05/2015  CLINICAL DATA:  82 year old male with tachycardia EXAM: PORTABLE CHEST 1 VIEW COMPARISON:  Chest radiograph dated 10/28/2015 FINDINGS: Single portable view of the chest demonstrates emphysematous changes of the lungs. There is no focal consolidation, pleural effusion, or pneumothorax. The cardiac silhouette is within normal limits with no acute osseous pathology. IMPRESSION: No active disease. Electronically Signed   By: Elgie Collard M.D.   On: 11/05/2015 00:30    EKG: Orders placed or performed during the hospital encounter of 11-26-2015  . EKG 12-Lead  . EKG 12-Lead    IMPRESSION AND PLAN: 82 year old male patient with history of hypothyroidism, coronary artery disease, atrial fibrillation, hypertension, hyperlipidemia, rheumatoid arthritis presented to the emergency room with confusion. Workup in the emergency room showed a elevated creatinine and hyperkalemia along with wide complex ventricular tachyarrhythmia. Patient was cardioverted in the emergency room and put on IV amiodarone drip. Admitting diagnosis 1. Altered mental status could be secondary to uremia 2. Acute kidney injury 3. Severe hyperkalemia 4. Ventricular tachyarrhythmia 5. Elevated troponin which could be secondary to renal failure versus non-STEMI 6. Leukocytosis Treatment plan Admit patient to intensive care unit Continue IV amiodarone drip IV heparin drip for anticoagulation Cycle troponins Renal ultrasound to assess renal parenchyma any obstruction Follow-up potassium level Nephrology consultation for renal  failure Cardiology consultation for her arrhythmia Intensivist and critical care consultation Check echocardiogram Follow-up lactic acid level and WBC count Supportive care.   All the records are reviewed and case discussed with ED provider. Management plans discussed with the patient, family and they are in agreement.  CODE STATUS:FULL Code Status History    Date Active Date Inactive Code Status Order ID Comments User Context   10/28/2015  3:47 PM 10/31/2015  7:22 PM Full Code [id number]  Enid Baas, MD Inpatient   12/05/2014  6:02 AM 12/06/2014  8:06 PM Full Code [id number]  Crissie Figures, MD Inpatient   12/04/2014  3:46 PM 12/05/2014  6:02 AM Full Code [id number]  Loleta Rose, MD ED       TOTAL CRITICAL CARE TIME TAKING CARE OF THIS PATIENT: 60 minutes.    Ihor Austin M.D on 11/05/2015 at 1:35 AM  Between 7am to 6pm - Pager - [phone]  After 6pm go to [email or web address] - password [email or web address] [facility]  [facility]  Office  [phone]  CC: Primary care physician; Jaclyn Shaggy, MD

## 2015-11-05 NOTE — ED Provider Notes (Addendum)
[facility] Emergency Department Provider Note   ____________________________________________  Time seen: Approximately 2315 AM  I have reviewed the triage vital signs and the nursing notes.   HISTORY  Chief Complaint Abnormal Lab  Patient's shaky with some mild confusion, history obtained from the patient's family.  HPI Matthew Foley is a 82 y.o. male who comes into the hospital today sent by his nursing home for abnormal labs. According to the patient's family he fell last week and was seen here and admitted. The patient was also very dehydrated. He was released to [facility] for rehabilitation. The family reports that they have been told he was having some episodes of vomiting. He vomited a few times but nothing consistently. According to the family he is typically very sharp and not shaky but they noticed that he was not looking right on Saturday with some confusion and shakiness. He seemed worse yesterday. He had blood work sent out today and he was sent in because he had some abnormal labs. The patient reports that they're unsure what labs are exactly abnormal.The patient reports that he feels fine at this time.   Past Medical History  Diagnosis Date  . Depression     h/o psychiatric hospitalisations prior  . Hypertension   . Hyperlipidemia   . CAD (coronary artery disease)     at least 1 stent  . Hx of pancreatitis   . Rheumatoid arthritis (HCC)   . Hypothyroidism   . GERD (gastroesophageal reflux disease)   . Atrial fibrillation [facility])     Patient Active Problem List   Diagnosis Date Noted  . Wide-complex tachycardia (HCC) 11/05/2015  . Altered mental status 11/05/2015  . Acute renal failure (ARF) (HCC) 11/05/2015  . Ventricular tachyarrhythmia (HCC) 11/05/2015  . Pressure ulcer 10/29/2015  . Protein-calorie malnutrition, severe 10/29/2015  . Acute delirium 10/29/2015  . Severe recurrent major depression without psychotic  features (HCC) 10/29/2015  . Rhabdomyolysis 10/28/2015  . Drug overdose, intentional (HCC) 12/05/2014  . Arrhythmia, ventricular 12/05/2014  . CAD (coronary artery disease) 12/05/2014  . Depression 12/05/2014  . Major depression (HCC) 12/04/2014  . Suicidal behavior 12/04/2014  . Hypothyroid 12/04/2014  . Hypertension 12/04/2014    Past Surgical History  Procedure Laterality Date  . Knee replacement Bilateral   . Cholecystectomy    . Hernia repair    . Toe amputation      Right, 2nd toe  . Joint replacement      Current Outpatient Rx  Name  Route  Sig  Dispense  Refill  . acetaminophen (TYLENOL) 650 MG CR tablet   Oral   Take 650 mg by mouth every 8 (eight) hours as needed for pain.         [name] ALPRAZolam (XANAX) 0.5 MG tablet   Oral   Take 0.5 mg by mouth 2 (two) times daily.         [name] aspirin EC 81 MG EC tablet   Oral   Take 1 tablet (81 mg total) by mouth daily.   30 tablet   0   . escitalopram (LEXAPRO) 10 MG tablet   Oral   Take 1 tablet (10 mg total) by mouth daily.   30 tablet   1   . feeding supplement (BOOST / RESOURCE BREEZE) LIQD   Oral   Take 1 Container by mouth 3 (three) times daily between meals.   30 Container   2   . ferrous sulfate 325 (65 FE)  MG tablet   Oral   Take 325 mg by mouth daily.         [name] ketoconazole (NIZORAL) 2 % shampoo   Topical   Apply 1 application topically once a week.         . levothyroxine (SYNTHROID, LEVOTHROID) 75 MCG tablet   Oral   Take 75 mcg by mouth daily before breakfast.         . lisinopril (PRINIVIL,ZESTRIL) 2.5 MG tablet   Oral   Take 2.5 mg by mouth daily.         . Multiple Vitamin (MULTIVITAMIN WITH MINERALS) TABS tablet   Oral   Take 1 tablet by mouth daily.   30 tablet   0   . pantoprazole (PROTONIX) 40 MG tablet   Oral   Take 40 mg by mouth daily.         . potassium chloride SA (K-DUR,KLOR-CON) 20 MEQ tablet   Oral   Take 20 mEq by mouth 2 (two) times daily.           [name] rOPINIRole (REQUIP) 0.25 MG tablet   Oral   Take 0.25 mg by mouth every evening.         . tamsulosin (FLOMAX) 0.4 MG CAPS capsule   Oral   Take 0.4 mg by mouth daily.         [name] ALPRAZolam (XANAX) 0.25 MG tablet   Oral   Take 0.5 tablets (0.125 mg total) by mouth 2 (two) times daily.   30 tablet   0     Allergies Review of patient's allergies indicates no known allergies.  Family History  Problem Relation Age of Onset  . Hypertension Mother   . Hypertension Father   . CAD Father     Social History Social History  Substance Use Topics  . Smoking status: Former Games developer  . Smokeless tobacco: Former Neurosurgeon     Comment: smokes cigars  . Alcohol Use: No     Comment: Margarita some times    Review of Systems Constitutional: No fever/chills Eyes: No visual changes. ENT: No sore throat. Cardiovascular: Tachycardia, Mild chest pain. Respiratory: Denies shortness of breath. Gastrointestinal: No abdominal pain.  No nausea, no vomiting.  No diarrhea.  No constipation. Genitourinary: Negative for dysuria. Musculoskeletal: Negative for back pain. Skin: Negative for rash. Neurological: Negative for headaches, focal weakness or numbness.  10-point ROS otherwise negative.  ____________________________________________   PHYSICAL EXAM:  VITAL SIGNS: ED Triage Vitals  Enc Vitals Group     BP 11-26-15 2308 114/85 mmHg     Pulse Rate 2015/11/26 2315 170     Resp 11-26-15 2315 22     Temp --      Temp Source 11-26-15 2315 Oral     SpO2 2015-11-26 2315 98 %     Weight 2015-11-26 2315 180 lb (81.647 kg)     Height 2015/11/26 2315 6' (1.829 m)     Head Cir --      Peak Flow --      Pain Score --      Pain Loc --      Pain Edu? --      Excl. in GC? --     Constitutional: Alert with some mild confusion. Ill appearing and in moderate distress. Eyes: Conjunctivae are normal. PERRL. EOMI. Head: Atraumatic. Nose: No congestion/rhinnorhea. Mouth/Throat: Mucous membranes are  moist.  Oropharynx non-erythematous. Cardiovascular: Tachycardic irregular rhythm. Grossly normal heart sounds.  Good peripheral circulation. Respiratory:  Normal respiratory effort.  No retractions. Lungs CTAB. Gastrointestinal: Soft and nontender. No distention. Positive bowel sounds. Musculoskeletal: No lower extremity tenderness nor edema.   Neurologic:  Normal speech with mildly confused language, Skin:  Skin is warm, dry and intact.  Psychiatric: Mood and affect are normal.   ____________________________________________   LABS (all labs ordered are listed, but only abnormal results are displayed)  Labs Reviewed  CBC - Abnormal; Notable for the following:    WBC 19.9 (*)    RBC 3.74 (*)    Hemoglobin 11.8 (*)    HCT 35.8 (*)    RDW 14.8 (*)    All other components within normal limits  COMPREHENSIVE METABOLIC PANEL - Abnormal; Notable for the following:    Potassium 6.3 (*)    CO2 16 (*)    Glucose, Bld 138 (*)    BUN 127 (*)    Creatinine, Ser 8.60 (*)    Calcium 8.7 (*)    Total Protein 6.0 (*)    Albumin 2.5 (*)    GFR calc non Af Amer 5 (*)    GFR calc Af Amer 6 (*)    Anion gap 16 (*)    All other components within normal limits  TROPONIN I - Abnormal; Notable for the following:    Troponin I 0.30 (*)    All other components within normal limits  BLOOD GAS, VENOUS - Abnormal; Notable for the following:    pCO2, Ven 32 (*)    pO2, Ven <31.0 (*)    Bicarbonate 16.5 (*)    Acid-base deficit 8.6 (*)    All other components within normal limits  LACTIC ACID, PLASMA  LACTIC ACID, PLASMA  URINALYSIS COMPLETEWITH MICROSCOPIC ([facility] ONLY)  PROTIME-INR  APTT   ____________________________________________  EKG  ED ECG REPORT I, Rebecka Apley, the attending physician, personally viewed and interpreted this ECG.   Date: 10/28/2015  EKG Time: 2317  Rate: 170  Rhythm: wide complex tachycardia  Axis: left axis deviation  Intervals:wide complex  tachycardia  ST&T Change: st depression I and V2, flipped t wave aVL  ED ECG REPORT #2 I, Rebecka Apley, the attending physician, personally viewed and interpreted this ECG.   Date: 10/22/2015  EKG Time: 2327  Rate: 125  Rhythm: sinus tachycardia, RBBB, peaked t waves  Axis: left axis  Intervals:right bundle branch block  ST&T Change: flipped t waves I, aVL, Vs   ____________________________________________  RADIOLOGY  CXR: No active disease ____________________________________________   PROCEDURES  Procedure(s) performed: Please, see procedure note(s).  Synchronized cardioversion at 100 J The procedure was done on an emergent basis given the patient's altered mental status. The pads were placed on the patient's chest and the cardioversion successfully decreased the patient's heart rate.  Critical Care performed: Yes, see critical care note(s)  CRITICAL CARE Performed by: Lucrezia Europe P   Total critical care time: 30 minutes  Critical care time was exclusive of separately billable procedures and treating other patients.  Critical care was necessary to treat or prevent imminent or life-threatening deterioration.  Critical care was time spent personally by me on the following activities: development of treatment plan with patient and/or surrogate as well as nursing, discussions with consultants, evaluation of patient's response to treatment, examination of patient, obtaining history from patient or surrogate, ordering and performing treatments and interventions, ordering and review of laboratory studies, ordering and review of radiographic studies, pulse oximetry and re-evaluation of patient's condition.  ____________________________________________  INITIAL IMPRESSION / ASSESSMENT AND PLAN / ED COURSE  Pertinent labs & imaging results that were available during my care of the patient were reviewed by me and considered in my medical decision making (see  chart for details).  This is an 82 year old male who was sent into the emergency department by his nursing home for abnormal blood work. I was called into the patient's room once he was placed on the monitor it was discovered that he was tachycardic with a rate of 170. I then evaluated the paperwork that arrived with the patient and found that he had an elevated potassium as well as an elevated creatinine. I did give the patient a dose of calcium chloride as well as a dose of sodium bicarbonate. The patient received an albuterol treatment and an effort to decrease his potassium. He had a liter of normal saline given in a bolus. He remained tachycardic and seemed to have some mild somnolence and altered mental status. As the patient had a wide complex tachycardia at the decision was made to do a synchronized cardioversion. The patient received 2 mg of Versed IV and then was cardioverted at 100 J. The patient's heart rate decreased to 125 after the synchronized cardioversion. The patient's blood work here confirm that he is in acute renal failure with a BUN of 127, a potassium of 6.3 and a creatinine of 8.6. The patient did receive a liter of normal saline. The patient also has a troponin that is 0.3 but when asked he said prior to the cardioversion he did not have significant chest pain. The decision was made to admit the patient to the hospitalist service for evaluation of his acute renal failure. I discussed the results with the family and with the admitting team. ____________________________________________   FINAL CLINICAL IMPRESSION(S) / ED DIAGNOSES  Final diagnoses:  Acute renal failure, unspecified acute renal failure type (HCC)  Altered mental status, unspecified altered mental status type  Wide-complex tachycardia (HCC)  Renal failure      NEW MEDICATIONS STARTED DURING THIS VISIT:  New Prescriptions   No medications on file     Note:  This document was prepared using Dragon voice  recognition software and may include unintentional dictation errors.    Rebecka Apley, MD 11/05/15 1062  Rebecka Apley, MD 11/05/15 [phone]

## 2015-11-05 NOTE — ED Notes (Signed)
Pt alert. sinustach on monitor.  Family with pt

## 2015-11-05 NOTE — Clinical Social Work Note (Signed)
Clinical Social Work Assessment  Patient Details  Name: Matthew Foley MRN: [id number] Date of Birth: August 02, 1932  Date of referral:  11/05/15               Reason for consult:  Facility Placement                Permission sought to share information with:   (patient disoriented) Permission granted to share information::     Name::        Agency::     Relationship::     Contact Information:     Housing/Transportation Living arrangements for the past 2 months:  Skilled Nursing Facility, Single Family Home Source of Information:    Patient Interpreter Needed:  None Criminal Activity/Legal Involvement Pertinent to Current Situation/Hospitalization:  No - Comment as needed Significant Relationships:  Adult Children Lives with:  Facility Resident Do you feel safe going back to the place where you live?    Need for family participation in patient care:     Care giving concerns:  Patient recently sent to [facility] for STR a few days ago.   Social Worker assessment / plan:  Patient is known to CSW from a recent admission in which patient was discharged on 5/11 to go to [facility] for STR. Patient has been readmitted and is currently in ICU. Patient's daughter is his contact: Matthew Foley: [phone].   Employment status:  Retired Database administrator PT Recommendations:    Information / Referral to community resources:     Patient/Family's Response to care:  Not contacted by CSW as of yet.  Patient/Family's Understanding of and Emotional Response to Diagnosis, Current Treatment, and Prognosis:  Not contacted by CSW as of yet.  Emotional Assessment Appearance:    Attitude/Demeanor/Rapport:  Unable to Assess Affect (typically observed):  Unable to Assess Orientation:    Alcohol / Substance use:  Not Applicable Psych involvement (Current and /or in the community):  No (Comment)  Discharge Needs  Concerns to be addressed:  Care  Coordination Readmission within the last 30 days:  Yes Current discharge risk:  None Barriers to Discharge:  No Barriers Identified   [name], LCSW 11/05/2015, 4:05 PM

## 2015-11-05 NOTE — Consult Note (Signed)
Date: 11/05/2015                  Patient Name:  Matthew Foley  MRN: [id number]  DOB: 1932/08/05  Age / Sex: 82 y.o., male         PCP: Jaclyn Shaggy, MD                 Service Requesting Consult: Internal medicine/Dr Mody                 Reason for Consult: ARF, Hyperkalemia            History of Present Illness: Patient is a 82 y.o. male with medical problems of Hypertension, hyperlipidemia, coronary disease, rheumatoid arthritis, hypothyroidism, atrial fibrillation, who was admitted to [facility] on 10/21/2015 for evaluation of abnormal labs According to the admission H&P, patient was admitted to the hospital recently for dehydration and discharged to rehabilitation. Over the weekend, he had nausea and vomiting. Also some confusion.  Patient was brought to the ER via [facility]. Patient had atrial fibrillation with RVR. He was cardioverted last night. Admission labs show increased serum creatinine of 8.60. BUN 133. Bicarbonate is low at 15. Patient has severe hyperkalemia at 6.3 last night and 6.0 this morning. Creatinine of 1.44. Nephrology consult requested for urgent evaluation   Medications: Outpatient medications: Prescriptions prior to admission  Medication Sig Dispense Refill Last Dose  . acetaminophen (TYLENOL) 650 MG CR tablet Take 650 mg by mouth every 8 (eight) hours as needed for pain.   prn at prn  . ALPRAZolam (XANAX) 0.5 MG tablet Take 0.5 mg by mouth 2 (two) times daily.   10/22/2015 at Unknown time  . aspirin EC 81 MG EC tablet Take 1 tablet (81 mg total) by mouth daily. 30 tablet 0 10/31/2015 at Unknown time  . escitalopram (LEXAPRO) 10 MG tablet Take 1 tablet (10 mg total) by mouth daily. 30 tablet 1 11/13/2015 at Unknown time  . feeding supplement (BOOST / RESOURCE BREEZE) LIQD Take 1 Container by mouth 3 (three) times daily between meals. 30 Container 2 11/17/2015 at Unknown time  . ferrous sulfate 325 (65 FE) MG tablet Take 325 mg by mouth daily.    10/25/2015 at Unknown time  . ketoconazole (NIZORAL) 2 % shampoo Apply 1 application topically once a week.   Past Week at Unknown time  . levothyroxine (SYNTHROID, LEVOTHROID) 75 MCG tablet Take 75 mcg by mouth daily before breakfast.   10/31/2015 at Unknown time  . lisinopril (PRINIVIL,ZESTRIL) 2.5 MG tablet Take 2.5 mg by mouth daily.   11/16/2015 at Unknown time  . Multiple Vitamin (MULTIVITAMIN WITH MINERALS) TABS tablet Take 1 tablet by mouth daily. 30 tablet 0 10/24/2015 at Unknown time  . pantoprazole (PROTONIX) 40 MG tablet Take 40 mg by mouth daily.   11/06/2015 at Unknown time  . potassium chloride SA (K-DUR,KLOR-CON) 20 MEQ tablet Take 20 mEq by mouth 2 (two) times daily.   11/07/2015 at Unknown time  . rOPINIRole (REQUIP) 0.25 MG tablet Take 0.25 mg by mouth every evening.   10/27/2015 at Unknown time  . tamsulosin (FLOMAX) 0.4 MG CAPS capsule Take 0.4 mg by mouth daily.   10/21/2015 at Unknown time  . ALPRAZolam (XANAX) 0.25 MG tablet Take 0.5 tablets (0.125 mg total) by mouth 2 (two) times daily. 30 tablet 0     Current medications: Current Facility-Administered Medications  Medication Dose Route Frequency Provider Last Rate Last Dose  . 0.9 %  sodium chloride infusion   Intravenous Continuous Ihor Austin, MD 100 mL/hr at 11/05/15 0600    . acetaminophen (TYLENOL) tablet 650 mg  650 mg Oral Q6H PRN Ihor Austin, MD       Or  . acetaminophen (TYLENOL) suppository 650 mg  650 mg Rectal Q6H PRN Ihor Austin, MD      . amiodarone (NEXTERONE PREMIX) 360-4.14 MG/200ML-% (1.8 mg/mL) IV infusion  30 mg/hr Intravenous Continuous Pavan Pyreddy, MD 16.7 mL/hr at 11/05/15 0745 30 mg/hr at 11/05/15 0745  . aspirin EC tablet 81 mg  81 mg Oral Daily Pavan Pyreddy, MD      . escitalopram (LEXAPRO) tablet 10 mg  10 mg Oral Daily Pavan Pyreddy, MD      . feeding supplement (BOOST / RESOURCE BREEZE) liquid 1 Container  1 Container Oral TID BM Pavan Pyreddy, MD      . fentaNYL (SUBLIMAZE) 100 MCG/2ML  injection           . ferrous sulfate tablet 325 mg  325 mg Oral Daily Pavan Pyreddy, MD      . heparin ADULT infusion 100 units/mL (25000 units/250 mL)  1,000 Units/hr Intravenous Continuous Ihor Austin, MD 10 mL/hr at 11/05/15 0353 1,000 Units/hr at 11/05/15 0353  . ipratropium-albuterol (DUONEB) 0.5-2.5 (3) MG/3ML nebulizer solution           . levothyroxine (SYNTHROID, LEVOTHROID) tablet 75 mcg  75 mcg Oral QAC breakfast Pavan Pyreddy, MD      . mupirocin ointment (BACTROBAN) 2 %   Nasal BID Ihor Austin, MD      . ondansetron (ZOFRAN) tablet 4 mg  4 mg Oral Q6H PRN Ihor Austin, MD       Or  . ondansetron (ZOFRAN) injection 4 mg  4 mg Intravenous Q6H PRN Pavan Pyreddy, MD      . pantoprazole (PROTONIX) EC tablet 40 mg  40 mg Oral Daily Pavan Pyreddy, MD      . rOPINIRole (REQUIP) tablet 0.25 mg  0.25 mg Oral QPM Pavan Pyreddy, MD      . sodium chloride flush (NS) 0.9 % injection 3 mL  3 mL Intravenous Q12H Pavan Pyreddy, MD   3 mL at 11/05/15 0355  . tamsulosin (FLOMAX) capsule 0.4 mg  0.4 mg Oral Daily Ihor Austin, MD          Allergies: No Known Allergies    Past Medical History: Past Medical History  Diagnosis Date  . Depression     h/o psychiatric hospitalisations prior  . Hypertension   . Hyperlipidemia   . CAD (coronary artery disease)     at least 1 stent  . Hx of pancreatitis   . Rheumatoid arthritis (HCC)   . Hypothyroidism   . GERD (gastroesophageal reflux disease)   . Atrial fibrillation [facility])      Past Surgical History: Past Surgical History  Procedure Laterality Date  . Knee replacement Bilateral   . Cholecystectomy    . Hernia repair    . Toe amputation      Right, 2nd toe  . Joint replacement       Family History: Family History  Problem Relation Age of Onset  . Hypertension Mother   . Hypertension Father   . CAD Father      Social History: Social History   Social History  . Marital Status: Widowed    Spouse Name: N/A  . Number  of Children: N/A  . Years of Education: N/A   Occupational History  .  retired    Social History Main Topics  . Smoking status: Former Games developer  . Smokeless tobacco: Former Neurosurgeon     Comment: smokes cigars  . Alcohol Use: No     Comment: Margarita some times  . Drug Use: No  . Sexual Activity: Not on file   Other Topics Concern  . Not on file   Social History Narrative   Stays in an apartment by himself. Ambulates with a cane.     Review of Systems:Not available due to altered mental status Gen:  HEENT:  CV:  Resp:  GI: GU :  MS:  Derm:   Psych: Heme:  Neuro:  Endocrine  Vital Signs: Blood pressure 122/88, pulse 108, temperature 97 F (36.1 C), temperature source Oral, resp. rate 25, height 6' (1.829 m), weight 81.647 kg (180 lb), SpO2 96 %.   Intake/Output Summary (Last 24 hours) at 11/05/15 0900 Last data filed at 11/05/15 0600  Gross per 24 hour  Intake 480.84 ml  Output      0 ml  Net 480.84 ml    Weight trends: [employer]   11/20/2015 2315  Weight: 81.647 kg (180 lb)    Physical Exam: General:  Chronically ill-appearing, cachectic gentleman, laying in the ICU bed   HEENT Dry oral mucous membranes, anicteric   Neck:  Supple   Lungs: Clear to auscultation bilaterally, normal effort   Heart::  Tachycardic, irregular   Abdomen: Distended, somewhat tender to touch   Extremities:  No peripheral edema   Neurologic: Lethargic, minimally arousable, answers only a few questions   Skin: Decubitus over heels   Access:   Foley: To be Placed        Lab results: Basic Metabolic Panel:  Recent Labs Lab 10/30/15 1013 11-20-2015 2320 11/05/15 0350  NA  --  137 137  K  --  6.3* 6.0*  CL  --  105 108  CO2  --  16* 15*  GLUCOSE  --  138* 154*  BUN  --  127* 133*  CREATININE 1.44* 8.60* 8.60*  CALCIUM  --  8.7* 8.9    Liver Function Tests:  Recent Labs Lab 20-Nov-2015 2320  AST 28  ALT 31  ALKPHOS 69  BILITOT 0.8  PROT 6.0*  ALBUMIN 2.5*    No results for input(s): LIPASE, AMYLASE in the last 168 hours. No results for input(s): AMMONIA in the last 168 hours.  CBC:  Recent Labs Lab 11/20/15 2320 11/05/15 0350  WBC 19.9* 20.4*  HGB 11.8* 10.9*  HCT 35.8* 32.4*  MCV 95.6 95.3  PLT 154 135*    Cardiac Enzymes:  Recent Labs Lab 10/30/15 1013  11/05/15 0350  CKTOTAL 567*  --   --   TROPONINI  --   < > 0.62*  < > = values in this interval not displayed.  BNP: Invalid input(s): POCBNP  CBG:  Recent Labs Lab 11/05/15 0321  GLUCAP 142*    Microbiology: Recent Results (from the past 720 hour(s))  MRSA PCR Screening     Status: Abnormal   Collection Time: 11/05/15  3:48 AM  Result Value Ref Range Status   MRSA by PCR POSITIVE (A) NEGATIVE Final    Comment:        The GeneXpert MRSA Assay (FDA approved for NASAL specimens only), is one component of a comprehensive MRSA colonization surveillance program. It is not intended to diagnose MRSA infection nor to guide or monitor treatment for MRSA infections. CALLED TO EMMA EGWUATU @  0515 ON 11/05/2015 BY CAF      Coagulation Studies:  Recent Labs  11/24/15 2320  LABPROT 17.1*  INR 1.38    Urinalysis: No results for input(s): COLORURINE, LABSPEC, PHURINE, GLUCOSEU, HGBUR, BILIRUBINUR, KETONESUR, PROTEINUR, UROBILINOGEN, NITRITE, LEUKOCYTESUR in the last 72 hours.  Invalid input(s): APPERANCEUR      Imaging: Dg Chest Portable 1 View  11/05/2015  CLINICAL DATA:  82 year old male with tachycardia EXAM: PORTABLE CHEST 1 VIEW COMPARISON:  Chest radiograph dated 10/28/2015 FINDINGS: Single portable view of the chest demonstrates emphysematous changes of the lungs. There is no focal consolidation, pleural effusion, or pneumothorax. The cardiac silhouette is within normal limits with no acute osseous pathology. IMPRESSION: No active disease. Electronically Signed   By: Elgie Collard M.D.   On: 11/05/2015 00:30      Assessment & Plan: Pt is a  82 y.o. yo male with a PMHX of Hypertension, hyperlipidemia, coronary disease, rheumatoid arthritis, hypothyroidism, atrial fibrillation, was admitted on 2015/11/24 with A. fib, RVR requiring cardioversion, acute renal failure with severe hyperkalemia.   1. Acute renal failure. Bedside bladder scan revealed urine greater than 1000 cc. A Foley was placed, over 2300 cc of urine was obtained immediately Therefore, acute renal failure is likely secondary to urinary retention Would continue Flomax Continue IV fluids as patient appears quite dehydrated Continue Foley for now.   2. Severe hyperkalemia Now that obstruction has been bypassed, electrolytes are expected to correct quickly We'll repeat potassium level in few hours.

## 2015-11-05 NOTE — Progress Notes (Signed)
.  eLink Physician Progress Note and Electrolyte Replacement  Patient Name: Mckay Tegtmeyer DOB: [birth year] MRN: [id number]  Date of Service  11/05/2015   HPI/Events of Note    Recent Labs Lab 10/30/15 1013  2015/11/27 2320 11/05/15 0350 11/05/15 1331 11/05/15 2206  NA  --   --  137 137 138  --   K  --   < > 6.3* 6.0* 3.9  --   CL  --   --  105 108 110  --   CO2  --   --  16* 15* 15*  --   GLUCOSE  --   --  138* 154* 145*  --   BUN  --   --  127* 133* 114*  --   CREATININE 1.44*  --  8.60* 8.60* 6.11*  --   CALCIUM  --   --  8.7* 8.9 8.0*  --   MG  --   --   --   --   --  1.5*  < > = values in this interval not displayed.  Estimated Creatinine Clearance: 10.6 mL/min (by C-G formula based on Cr of 6.11).  Intake/Output      05/16 0701 - 05/17 0700   I.V. (mL/kg) 2063.4 (25.3)   Total Intake(mL/kg) 2063.4 (25.3)   Urine (mL/kg/hr) 2000 (1.5)   Total Output 2000   Net +63.4        - I/O DETAILED x 24h    Total I/O In: 300 [I.V.:300] Out: -  - I/O THIS SHIFT    ASSESSMENT Low mag ATN - making urine and improving Wide complex tachycardia  eICURN Interventions  2gm mag sulfate   ASSESSMENT: MAJOR ELECTROLYTE      Dr. Kalman Shan, M.D., [facility].C.P Pulmonary and Critical Care Medicine Staff Physician [facility] [place] [facility] Pager: [phone], If no answer or between  15:00h - 7:00h: call [phone]  11/05/2015 11:29 PM

## 2015-11-05 NOTE — Progress Notes (Signed)
End of shift report given to Nia.

## 2015-11-05 NOTE — Progress Notes (Signed)
*  PRELIMINARY RESULTS* Echocardiogram 2D Echocardiogram has been performed.  Matthew Foley 11/05/2015, 2:50 PM

## 2015-11-05 NOTE — Progress Notes (Signed)
Upon AM assessment, lower ABD tender to touch, depend dry. Bladder scan complete and revealed >1039ml. 39fr Foley placed. Coke colored urine returned.

## 2015-11-05 NOTE — Progress Notes (Signed)
Matthew Foley is a 82 y.o. male  [id number]  Primary Cardiologist: Adrian Blackwater Reason for Consultation: VT  HPI: 47 YOWM presented to [facility] with confusion and had V. Tach requiring cardioversion but had hyperkalemia.   Review of Systems: confused and unable to get much history   Past Medical History  Diagnosis Date  . Depression     h/o psychiatric hospitalisations prior  . Hypertension   . Hyperlipidemia   . CAD (coronary artery disease)     at least 1 stent  . Hx of pancreatitis   . Rheumatoid arthritis (HCC)   . Hypothyroidism   . GERD (gastroesophageal reflux disease)   . Atrial fibrillation (HCC)     Medications Prior to Admission  Medication Sig Dispense Refill  . acetaminophen (TYLENOL) 650 MG CR tablet Take 650 mg by mouth every 8 (eight) hours as needed for pain.    [name] ALPRAZolam (XANAX) 0.5 MG tablet Take 0.5 mg by mouth 2 (two) times daily.    [name] aspirin EC 81 MG EC tablet Take 1 tablet (81 mg total) by mouth daily. 30 tablet 0  . escitalopram (LEXAPRO) 10 MG tablet Take 1 tablet (10 mg total) by mouth daily. 30 tablet 1  . feeding supplement (BOOST / RESOURCE BREEZE) LIQD Take 1 Container by mouth 3 (three) times daily between meals. 30 Container 2  . ferrous sulfate 325 (65 FE) MG tablet Take 325 mg by mouth daily.    [name] ketoconazole (NIZORAL) 2 % shampoo Apply 1 application topically once a week.    . levothyroxine (SYNTHROID, LEVOTHROID) 75 MCG tablet Take 75 mcg by mouth daily before breakfast.    . lisinopril (PRINIVIL,ZESTRIL) 2.5 MG tablet Take 2.5 mg by mouth daily.    . Multiple Vitamin (MULTIVITAMIN WITH MINERALS) TABS tablet Take 1 tablet by mouth daily. 30 tablet 0  . pantoprazole (PROTONIX) 40 MG tablet Take 40 mg by mouth daily.    . potassium chloride SA (K-DUR,KLOR-CON) 20 MEQ tablet Take 20 mEq by mouth 2 (two) times daily.    [name] rOPINIRole (REQUIP) 0.25 MG tablet Take 0.25 mg by mouth every evening.    . tamsulosin (FLOMAX) 0.4 MG CAPS  capsule Take 0.4 mg by mouth daily.    [name] ALPRAZolam (XANAX) 0.25 MG tablet Take 0.5 tablets (0.125 mg total) by mouth 2 (two) times daily. 30 tablet 0     . aspirin EC  81 mg Oral Daily  . escitalopram  10 mg Oral Daily  . feeding supplement  1 Container Oral TID BM  . fentaNYL      . ferrous sulfate  325 mg Oral Daily  . ipratropium-albuterol      . levothyroxine  75 mcg Oral QAC breakfast  . mupirocin ointment   Nasal BID  . pantoprazole  40 mg Oral Daily  . rOPINIRole  0.25 mg Oral QPM  . sodium chloride flush  3 mL Intravenous Q12H  . tamsulosin  0.4 mg Oral Daily    Infusions: . sodium chloride 100 mL/hr at 11/05/15 0600  . amiodarone 30 mg/hr (11/05/15 0745)  . heparin 1,000 Units/hr (11/05/15 0353)    No Known Allergies  Social History   Social History  . Marital Status: Widowed    Spouse Name: N/A  . Number of Children: N/A  . Years of Education: N/A   Occupational History  . retired    Social History Main Topics  . Smoking status: Former Games developer  . Smokeless tobacco:  Former Neurosurgeon     Comment: smokes cigars  . Alcohol Use: No     Comment: Margarita some times  . Drug Use: No  . Sexual Activity: Not on file   Other Topics Concern  . Not on file   Social History Narrative   Stays in an apartment by himself. Ambulates with a cane.    Family History  Problem Relation Age of Onset  . Hypertension Mother   . Hypertension Father   . CAD Father     PHYSICAL EXAM: Filed Vitals:   11/05/15 0600 11/05/15 0700  BP: 122/88 118/82  Pulse: 108 103  Temp:    Resp: 25 32     Intake/Output Summary (Last 24 hours) at 11/05/15 0917 Last data filed at 11/05/15 0600  Gross per 24 hour  Intake 480.84 ml  Output      0 ml  Net 480.84 ml    General:  Well appearing. No respiratory difficulty HEENT: normal Neck: supple. no JVD. Carotids 2+ bilat; no bruits. No lymphadenopathy or thryomegaly appreciated. Cor: PMI nondisplaced. Regular rate & rhythm. No  rubs, gallops or murmurs. Lungs: clear Abdomen: soft, nontender, nondistended. No hepatosplenomegaly. No bruits or masses. Good bowel sounds. Extremities: no cyanosis, clubbing, rash, edema Neuro: alert & oriented x 3, cranial nerves grossly intact. moves all 4 extremities w/o difficulty. Affect pleasant.  ECG: Wide complex tachycaria at 170/min  Results for orders placed or performed during the hospital encounter of 12/04/15 (from the past 24 hour(s))  CBC     Status: Abnormal   Collection Time: 12/04/15 11:20 PM  Result Value Ref Range   WBC 19.9 (H) 3.8 - 10.6 K/uL   RBC 3.74 (L) 4.40 - 5.90 MIL/uL   Hemoglobin 11.8 (L) 13.0 - 18.0 g/dL   HCT 00.9 (L) 38.1 - 82.9 %   MCV 95.6 80.0 - 100.0 fL   MCH 31.5 26.0 - 34.0 pg   MCHC 32.9 32.0 - 36.0 g/dL   RDW 93.7 (H) 16.9 - 67.8 %   Platelets 154 150 - 440 K/uL  Comprehensive metabolic panel     Status: Abnormal   Collection Time: 12/04/2015 11:20 PM  Result Value Ref Range   Sodium 137 135 - 145 mmol/L   Potassium 6.3 (H) 3.5 - 5.1 mmol/L   Chloride 105 101 - 111 mmol/L   CO2 16 (L) 22 - 32 mmol/L   Glucose, Bld 138 (H) 65 - 99 mg/dL   BUN 938 (H) 6 - 20 mg/dL   Creatinine, Ser 1.01 (H) 0.61 - 1.24 mg/dL   Calcium 8.7 (L) 8.9 - 10.3 mg/dL   Total Protein 6.0 (L) 6.5 - 8.1 g/dL   Albumin 2.5 (L) 3.5 - 5.0 g/dL   AST 28 15 - 41 U/L   ALT 31 17 - 63 U/L   Alkaline Phosphatase 69 38 - 126 U/L   Total Bilirubin 0.8 0.3 - 1.2 mg/dL   GFR calc non Af Amer 5 (L) >60 mL/min   GFR calc Af Amer 6 (L) >60 mL/min   Anion gap 16 (H) 5 - 15  Troponin I     Status: Abnormal   Collection Time: 12/04/2015 11:20 PM  Result Value Ref Range   Troponin I 0.30 (H) <0.031 ng/mL  Lactic acid, plasma     Status: None   Collection Time: 04-Dec-2015 11:20 PM  Result Value Ref Range   Lactic Acid, Venous 1.4 0.5 - 2.0 mmol/L  Protime-INR  Status: Abnormal   Collection Time: 2015-11-17 11:20 PM  Result Value Ref Range   Prothrombin Time 17.1 (H) 11.4 -  15.0 seconds   INR 1.38   APTT     Status: None   Collection Time: 11-17-2015 11:20 PM  Result Value Ref Range   aPTT 35 24 - 36 seconds  Blood gas, venous     Status: Abnormal   Collection Time: 2015/11/17 11:45 PM  Result Value Ref Range   pH, Ven 7.32 7.320 - 7.430   pCO2, Ven 32 (L) 44.0 - 60.0 mmHg   pO2, Ven <31.0 (L) 31.0 - 45.0 mmHg   Bicarbonate 16.5 (L) 21.0 - 28.0 mEq/L   Acid-base deficit 8.6 (H) 0.0 - 2.0 mmol/L   Patient temperature 37.0    Collection site LINE    Sample type VENOUS   Glucose, capillary     Status: Abnormal   Collection Time: 11/05/15  3:21 AM  Result Value Ref Range   Glucose-Capillary 142 (H) 65 - 99 mg/dL  Lactic acid, plasma     Status: None   Collection Time: 11/05/15  3:36 AM  Result Value Ref Range   Lactic Acid, Venous 1.2 0.5 - 2.0 mmol/L  MRSA PCR Screening     Status: Abnormal   Collection Time: 11/05/15  3:48 AM  Result Value Ref Range   MRSA by PCR POSITIVE (A) NEGATIVE  Troponin I     Status: Abnormal   Collection Time: 11/05/15  3:50 AM  Result Value Ref Range   Troponin I 0.62 (H) <0.031 ng/mL  CBC     Status: Abnormal   Collection Time: 11/05/15  3:50 AM  Result Value Ref Range   WBC 20.4 (H) 3.8 - 10.6 K/uL   RBC 3.40 (L) 4.40 - 5.90 MIL/uL   Hemoglobin 10.9 (L) 13.0 - 18.0 g/dL   HCT 32.3 (L) 55.7 - 32.2 %   MCV 95.3 80.0 - 100.0 fL   MCH 32.1 26.0 - 34.0 pg   MCHC 33.7 32.0 - 36.0 g/dL   RDW 02.5 (H) 42.7 - 06.2 %   Platelets 135 (L) 150 - 440 K/uL  Basic metabolic panel     Status: Abnormal   Collection Time: 11/05/15  3:50 AM  Result Value Ref Range   Sodium 137 135 - 145 mmol/L   Potassium 6.0 (H) 3.5 - 5.1 mmol/L   Chloride 108 101 - 111 mmol/L   CO2 15 (L) 22 - 32 mmol/L   Glucose, Bld 154 (H) 65 - 99 mg/dL   BUN 376 (H) 6 - 20 mg/dL   Creatinine, Ser 2.83 (H) 0.61 - 1.24 mg/dL   Calcium 8.9 8.9 - 15.1 mg/dL   GFR calc non Af Amer 5 (L) >60 mL/min   GFR calc Af Amer 6 (L) >60 mL/min   Anion gap 14 5 - 15    Dg Chest Portable 1 View  11/05/2015  CLINICAL DATA:  82 year old male with tachycardia EXAM: PORTABLE CHEST 1 VIEW COMPARISON:  Chest radiograph dated 10/28/2015 FINDINGS: Single portable view of the chest demonstrates emphysematous changes of the lungs. There is no focal consolidation, pleural effusion, or pneumothorax. The cardiac silhouette is within normal limits with no acute osseous pathology. IMPRESSION: No active disease. Electronically Signed   By: Elgie Collard M.D.   On: 11/05/2015 00:30     ASSESSMENT AND PLAN: ventricle tachycardia/hyperkalemia, continue IV amiodrone. Will monitor with you, patient probably devveloped this due renal failure and hyperkalemia.  KHAN,SHAUKAT  A 

## 2015-11-05 NOTE — [place] FL2 (Addendum)
[place] MEDICAID FL2 LEVEL OF CARE SCREENING TOOL     IDENTIFICATION  Patient Name: Matthew Foley Birthdate: 08-03-1932 Sex: male Admission Date (Current Location): 11/13/2015  [place] and [place] Number:  Chiropodist and Address:  [facility], [address] [place], [place] [address]      Provider Number: [id number]  Attending Physician Name and Address:  Adrian Saran, MD  Relative Name and Phone Number:       Current Level of Care: Hospital Recommended Level of Care: Skilled Nursing Facility Prior Approval Number:    Date Approved/Denied:   PASRR Number:   [id number]   Discharge Plan: SNF    Current Diagnoses: Patient Active Problem List   Diagnosis Date Noted  . Wide-complex tachycardia (HCC) 11/05/2015  . Altered mental status 11/05/2015  . Acute renal failure (ARF) (HCC) 11/05/2015  . Ventricular tachyarrhythmia (HCC) 11/05/2015  . Pressure ulcer 10/29/2015  . Protein-calorie malnutrition, severe 10/29/2015  . Acute delirium 10/29/2015  . Severe recurrent major depression without psychotic features (HCC) 10/29/2015  . Rhabdomyolysis 10/28/2015  . Drug overdose, intentional (HCC) 12/05/2014  . Arrhythmia, ventricular 12/05/2014  . CAD (coronary artery disease) 12/05/2014  . Depression 12/05/2014  . Major depression (HCC) 12/04/2014  . Suicidal behavior 12/04/2014  . Hypothyroid 12/04/2014  . Hypertension 12/04/2014    Orientation RESPIRATION BLADDER Height & Weight     Self   Nasal Cannula 2 (L/min)  Incontinent Weight: 180 lb (81.647 kg) Height:  6' (182.9 cm)  BEHAVIORAL SYMPTOMS/MOOD NEUROLOGICAL BOWEL NUTRITION STATUS   (none)   Incontinent Diet (npo currently)  AMBULATORY STATUS COMMUNICATION OF NEEDS Skin   Extensive Assist Verbally  Pressure Ulcer- Unstageable Right Elbow  Pressure Ulcer- Unstageable Right Hip Pressure Ulcer- Unstageable Left Buttocks  Pressure Ulcer- Stage II Right Buttocks  Right  Shoulder [place] Pressure Ulcer- Unstageable Right foot                       Personal Care Assistance Level of Assistance  Bathing, Dressing Bathing Assistance: Maximum assistance   Dressing Assistance: Maximum assistance     Functional Limitations Info             SPECIAL CARE FACTORS FREQUENCY  PT (By licensed PT)    5x per week                 Contractures Contractures Info: Not present    Additional Factors Info  Isolation Precautions         Isolation Precautions Info: 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 0.9 %  sodium chloride infusion   Intravenous Continuous Ihor Austin, MD 100 mL/hr at 11/05/15 1451    . acetaminophen (TYLENOL) tablet 650 mg  650 mg Oral Q6H PRN Ihor Austin, MD       Or  . acetaminophen (TYLENOL) suppository 650 mg  650 mg Rectal Q6H PRN Ihor Austin, MD      . ALPRAZolam Prudy Feeler) tablet 0.25 mg  0.25 mg Oral BID Adrian Saran, MD   0.25 mg at 11/05/15 1404  . amiodarone (NEXTERONE PREMIX) 360-4.14 MG/200ML-% (1.8 mg/mL) IV infusion  30 mg/hr Intravenous Continuous Pavan Pyreddy, MD 16.7 mL/hr at 11/05/15 0745 30 mg/hr at 11/05/15 0745  . aspirin EC tablet 81 mg  81 mg Oral Daily Pavan  Pyreddy, MD   81 mg at 11/05/15 0958  . escitalopram (LEXAPRO) tablet 10 mg  10 mg Oral Daily Ihor Austin, MD   10 mg at 11/05/15 0958  . feeding supplement (BOOST / RESOURCE BREEZE) liquid 1 Container  1 Container Oral TID BM Ihor Austin, MD   1 Container at 11/05/15 1000  . ferrous sulfate tablet 325 mg  325 mg Oral Daily Ihor Austin, MD   325 mg at 11/05/15 1001  . heparin ADULT infusion 100 units/mL (25000 units/250 mL)  1,400 Units/hr Intravenous Continuous Adrian Saran, MD 14 mL/hr at 11/05/15 1453 1,400 Units/hr at 11/05/15 1453  . levothyroxine (SYNTHROID,  LEVOTHROID) tablet 75 mcg  75 mcg Oral QAC breakfast Ihor Austin, MD   75 mcg at 11/05/15 0957  . mupirocin ointment (BACTROBAN) 2 %   Nasal BID Ihor Austin, MD      . ondansetron (ZOFRAN) tablet 4 mg  4 mg Oral Q6H PRN Ihor Austin, MD       Or  . ondansetron (ZOFRAN) injection 4 mg  4 mg Intravenous Q6H PRN Pavan Pyreddy, MD      . pantoprazole (PROTONIX) EC tablet 40 mg  40 mg Oral Daily Pavan Pyreddy, MD   40 mg at 11/05/15 1001  . rOPINIRole (REQUIP) tablet 0.25 mg  0.25 mg Oral QPM Pavan Pyreddy, MD      . simvastatin (ZOCOR) tablet 20 mg  20 mg Oral q1800 Sital Mody, MD      . sodium chloride flush (NS) 0.9 % injection 3 mL  3 mL Intravenous Q12H Pavan Pyreddy, MD   3 mL at 11/05/15 0930  . tamsulosin (FLOMAX) capsule 0.4 mg  0.4 mg Oral Daily Ihor Austin, MD   0.4 mg at 11/05/15 1001     Discharge Medications: Please see discharge summary for a list of discharge medications.  Relevant Imaging Results:  Relevant Lab Results:   Additional Information  SSN [id number]  [name], LCSW

## 2015-11-05 NOTE — ED Notes (Signed)
Pt waiting on admission.  Family with pt.  Sinus on moniitor at 102.

## 2015-11-05 NOTE — Progress Notes (Signed)
[facility] -  at [facility]   PATIENT NAME: Matthew Foley    MR#:  [id number]  DATE OF BIRTH:  [birth year]  SUBJECTIVE:   patient confused  REVIEW OF SYSTEMS:    Review of Systems  Unable to perform ROS: critical illness    Tolerating Diet:NPO      DRUG ALLERGIES:  No Known Allergies  VITALS:  Blood pressure 107/60, pulse 100, temperature 97 F (36.1 C), temperature source Oral, resp. rate 27, height 6' (1.829 m), weight 81.647 kg (180 lb), SpO2 96 %.  PHYSICAL EXAMINATION:   Physical Exam  Constitutional: No distress.  Critically ill  HENT:  Head: Normocephalic.  Eyes: No scleral icterus.  Neck: Normal range of motion. Neck supple. No JVD present. No tracheal deviation present.  Cardiovascular: Regular rhythm and normal heart sounds.  Exam reveals no gallop and no friction rub.   No murmur heard. tachy  Pulmonary/Chest: Effort normal and breath sounds normal. No stridor. No respiratory distress. He has no wheezes. He has no rales. He exhibits no tenderness.  Abdominal: Soft. Bowel sounds are normal. He exhibits no distension and no mass. There is no tenderness. There is no rebound and no guarding.  Musculoskeletal: Normal range of motion. He exhibits no edema.  Neurological: He is alert.  Skin: Skin is warm. No rash noted. He is not diaphoretic. No erythema.      LABORATORY PANEL:   CBC  Recent Labs Lab 11/05/15 0350  WBC 20.4*  HGB 10.9*  HCT 32.4*  PLT 135*   ------------------------------------------------------------------------------------------------------------------  Chemistries   Recent Labs Lab 2015-11-13 2320 11/05/15 0350  NA 137 137  K 6.3* 6.0*  CL 105 108  CO2 16* 15*  GLUCOSE 138* 154*  BUN 127* 133*  CREATININE 8.60* 8.60*  CALCIUM 8.7* 8.9  AST 28  --   ALT 31  --   ALKPHOS 69  --   BILITOT 0.8  --     ------------------------------------------------------------------------------------------------------------------  Cardiac Enzymes  Recent Labs Lab 11/13/2015 2320 11/05/15 0350 11/05/15 1029  TROPONINI 0.30* 0.62* 3.16*   ------------------------------------------------------------------------------------------------------------------  RADIOLOGY:  Dg Chest Portable 1 View  11/05/2015  CLINICAL DATA:  82 year old male with tachycardia EXAM: PORTABLE CHEST 1 VIEW COMPARISON:  Chest radiograph dated 10/28/2015 FINDINGS: Single portable view of the chest demonstrates emphysematous changes of the lungs. There is no focal consolidation, pleural effusion, or pneumothorax. The cardiac silhouette is within normal limits with no acute osseous pathology. IMPRESSION: No active disease. Electronically Signed   By: Elgie Collard M.D.   On: 11/05/2015 00:30     ASSESSMENT AND PLAN:   82 year old male with a history of essential hypertension, CAD atrial fibrillation who is discharged 4 days ago with acute metabolic encephalopathy from rhabdomyolysis and dehydration who presented with hyperkalemia and wide complex tachycardia. Patient was cardioverted in the emergency room and placed on IV amiodarone drip.  1. Acute metabolic encephalopathy: Likely due to uremia. TSH elevated, therefore we'll order T3 and T4. Exline order B12 and RPR. Order CT head to evaluate for stroke. Continue neuro checks every 4 hours.  2. Severe Hyperkalemia with EKG changes status post cardioversion: This is thought to be due to urinary obstruction. Electrolytes are expected to correct now that obstruction has been bypassed. Repeat potassium in 2 hours.  3. ARF due to urinary obstruction: Bedside bladder scan revealed 2300 cc of urinary retention. Continue Foley and Flomax. Continue IVF and appreciate Renal consult.  4. NSTEMI: Continue Heparin gtt Follow up  on ECHO. Check lipid panel and start Statin and continue  ASA.  5. V tach from hyperkalemia s/p cardioversion: Continue AMIODARONE gtt. Appreciate CARDS consult.   6. Hypothyroid: Continue current dose of SYNTHROID< but check T3 and tT due to elevated TSH   Speech consult and CSW consult  Will call family and update  CODE STATUS: FULL D/w Dr Thedore Mins and Dr Sung Amabile  CRITICAL CARE TOTAL TIME TAKING CARE OF THIS PATIENT: 45 minutes.   Patient at high risk for cardiac arrest   POSSIBLE D/C ???, DEPENDING ON CLINICAL CONDITION.   Elsworth Ledin M.D on 11/05/2015 at 11:51 AM  Between 7am to 6pm - Pager - [phone] After 6pm go to [email or web address] - password [email or web address]  [facility]  Office  [phone]  CC: Primary care physician; Jaclyn Shaggy, MD  Note: This dictation was prepared with Dragon dictation along with smaller phrase technology. Any transcriptional errors that result from this process are unintentional.

## 2015-11-05 NOTE — Progress Notes (Signed)
ANTICOAGULATION CONSULT NOTE - Initial Consult  Pharmacy Consult for heparin drip Indication: chest pain/ACS  No Known Allergies  Patient Measurements: Height: 6' (182.9 cm) Weight: 180 lb (81.647 kg) IBW/kg (Calculated) : 77.6 Heparin Dosing Weight: 82kg  Vital Signs: Temp: 99.5 F (37.5 C) (05/16 1400) Temp Source: Oral (05/16 1400) BP: 124/66 mmHg (05/16 1400) Pulse Rate: 134 (05/16 1400)  Labs:  Recent Labs  11-10-2015 2320 11/05/15 0350 11/05/15 1029 11/05/15 1331  HGB 11.8* 10.9*  --   --   HCT 35.8* 32.4*  --   --   PLT 154 135*  --   --   APTT 35  --   --   --   LABPROT 17.1*  --   --   --   INR 1.38  --   --   --   HEPARINUNFRC  --   --   --  0.11*  CREATININE 8.60* 8.60*  --   --   TROPONINI 0.30* 0.62* 3.16*  --     Estimated Creatinine Clearance: 7.5 mL/min (by C-G formula based on Cr of 8.6).   Medical History: Past Medical History  Diagnosis Date  . Depression     h/o psychiatric hospitalisations prior  . Hypertension   . Hyperlipidemia   . CAD (coronary artery disease)     at least 1 stent  . Hx of pancreatitis   . Rheumatoid arthritis (HCC)   . Hypothyroidism   . GERD (gastroesophageal reflux disease)   . Atrial fibrillation (HCC)     Medications:  No anticoagulation in PTA meds.  Assessment:  Goal of Therapy:  Heparin level 0.3-0.7 units/ml Monitor platelets by anticoagulation protocol: Yes   Plan:  Heparin level below goal at 0.11. Will bolus heparin 2400 units iv once and increase heparin infusion to 1400 units/hr. Next HL in 8 hours.   Matthew Foley D 11/05/2015,2:36 PM

## 2015-11-06 ENCOUNTER — Inpatient Hospital Stay: Payer: Medicare PPO

## 2015-11-06 DIAGNOSIS — R6521 Severe sepsis with septic shock: Secondary | ICD-10-CM

## 2015-11-06 DIAGNOSIS — A419 Sepsis, unspecified organism: Secondary | ICD-10-CM

## 2015-11-06 DIAGNOSIS — R401 Stupor: Secondary | ICD-10-CM

## 2015-11-06 DIAGNOSIS — R6520 Severe sepsis without septic shock: Secondary | ICD-10-CM

## 2015-11-06 DIAGNOSIS — I4891 Unspecified atrial fibrillation: Secondary | ICD-10-CM

## 2015-11-06 DIAGNOSIS — N179 Acute kidney failure, unspecified: Secondary | ICD-10-CM

## 2015-11-06 LAB — CBC
HCT: 32.9 % — ABNORMAL LOW (ref 40.0–52.0)
Hemoglobin: 11.2 g/dL — ABNORMAL LOW (ref 13.0–18.0)
MCH: 31.8 pg (ref 26.0–34.0)
MCHC: 33.9 g/dL (ref 32.0–36.0)
MCV: 93.8 fL (ref 80.0–100.0)
PLATELETS: 141 10*3/uL — AB (ref 150–440)
RBC: 3.51 MIL/uL — AB (ref 4.40–5.90)
RDW: 14.6 % — ABNORMAL HIGH (ref 11.5–14.5)
WBC: 20.2 10*3/uL — AB (ref 3.8–10.6)

## 2015-11-06 LAB — BASIC METABOLIC PANEL
Anion gap: 10 (ref 5–15)
BUN: 59 mg/dL — AB (ref 6–20)
CALCIUM: 7.5 mg/dL — AB (ref 8.9–10.3)
CO2: 19 mmol/L — ABNORMAL LOW (ref 22–32)
CREATININE: 2.44 mg/dL — AB (ref 0.61–1.24)
Chloride: 118 mmol/L — ABNORMAL HIGH (ref 101–111)
GFR calc Af Amer: 27 mL/min — ABNORMAL LOW (ref >60)
GFR, EST NON AFRICAN AMERICAN: 23 mL/min — AB (ref >60)
GLUCOSE: 119 mg/dL — AB (ref 65–99)
Potassium: 2.6 mmol/L — CL (ref 3.5–5.1)
Sodium: 147 mmol/L — ABNORMAL HIGH (ref 135–145)

## 2015-11-06 LAB — POTASSIUM
POTASSIUM: 3.3 mmol/L — AB (ref 3.5–5.1)
Potassium: 3.2 mmol/L — ABNORMAL LOW (ref 3.5–5.1)

## 2015-11-06 LAB — HEPARIN LEVEL (UNFRACTIONATED): HEPARIN UNFRACTIONATED: 0.51 [IU]/mL (ref 0.30–0.70)

## 2015-11-06 LAB — PHOSPHORUS: Phosphorus: 2.9 mg/dL (ref 2.5–4.6)

## 2015-11-06 LAB — RPR: RPR Ser Ql: NONREACTIVE

## 2015-11-06 LAB — PROCALCITONIN: PROCALCITONIN: 4.93 ng/mL

## 2015-11-06 LAB — MAGNESIUM: MAGNESIUM: 1.9 mg/dL (ref 1.7–2.4)

## 2015-11-06 LAB — T3, FREE: T3, Free: 0.8 pg/mL — ABNORMAL LOW (ref 2.0–4.4)

## 2015-11-06 MED ORDER — ACETAMINOPHEN 10 MG/ML IV SOLN
1000.0000 mg | Freq: Four times a day (QID) | INTRAVENOUS | Status: AC
  Administered 2015-11-06: 1000 mg via INTRAVENOUS
  Filled 2015-11-06: qty 100

## 2015-11-06 MED ORDER — LEVOTHYROXINE SODIUM 100 MCG IV SOLR
50.0000 ug | Freq: Every day | INTRAVENOUS | Status: DC
  Administered 2015-11-06 – 2015-11-13 (×8): 50 ug via INTRAVENOUS
  Filled 2015-11-06 (×10): qty 5

## 2015-11-06 MED ORDER — DILTIAZEM HCL 25 MG/5ML IV SOLN
10.0000 mg | Freq: Once | INTRAVENOUS | Status: AC
  Administered 2015-11-06: 10 mg via INTRAVENOUS

## 2015-11-06 MED ORDER — LEVOTHYROXINE SODIUM 88 MCG PO TABS: 88.0000 ug | ORAL_TABLET | Freq: Every day | ORAL | Status: DC

## 2015-11-06 MED ORDER — PANTOPRAZOLE SODIUM 40 MG IV SOLR
40.0000 mg | INTRAVENOUS | Status: DC
  Administered 2015-11-06 – 2015-11-11 (×6): 40 mg via INTRAVENOUS
  Filled 2015-11-06 (×6): qty 40

## 2015-11-06 MED ORDER — SODIUM CHLORIDE 0.9 % IV BOLUS (SEPSIS)
500.0000 mL | Freq: Once | INTRAVENOUS | Status: AC
  Administered 2015-11-06: 500 mL via INTRAVENOUS

## 2015-11-06 MED ORDER — DIGOXIN 0.25 MG/ML IJ SOLN
0.2500 mg | Freq: Once | INTRAMUSCULAR | Status: AC
  Administered 2015-11-06: 0.25 mg via INTRAVENOUS
  Filled 2015-11-06: qty 2

## 2015-11-06 MED ORDER — POTASSIUM CHLORIDE 10 MEQ/100ML IV SOLN
10.0000 meq | INTRAVENOUS | Status: DC
  Administered 2015-11-06 (×3): 10 meq via INTRAVENOUS
  Filled 2015-11-06 (×4): qty 100

## 2015-11-06 MED ORDER — DIGOXIN 0.25 MG/ML IJ SOLN
INTRAMUSCULAR | Status: AC
  Filled 2015-11-06: qty 2

## 2015-11-06 MED ORDER — DIGOXIN 0.25 MG/ML IJ SOLN
0.2500 mg | Freq: Once | INTRAMUSCULAR | Status: AC
  Administered 2015-11-06: 0.25 mg via INTRAVENOUS

## 2015-11-06 MED ORDER — POTASSIUM CHLORIDE 10 MEQ/100ML IV SOLN
10.0000 meq | INTRAVENOUS | Status: DC
  Administered 2015-11-06 (×4): 10 meq via INTRAVENOUS
  Filled 2015-11-06 (×4): qty 100

## 2015-11-06 MED ORDER — VANCOMYCIN HCL IN DEXTROSE 1-5 GM/200ML-% IV SOLN
1000.0000 mg | Freq: Once | INTRAVENOUS | Status: DC
  Filled 2015-11-06: qty 200

## 2015-11-06 MED ORDER — MORPHINE SULFATE (PF) 2 MG/ML IV SOLN: 0.5000 mg | Freq: Four times a day (QID) | INTRAVENOUS | Status: DC | PRN

## 2015-11-06 MED ORDER — DEXTROSE 5 % IV SOLN
2.0000 g | INTRAVENOUS | Status: DC
  Administered 2015-11-06: 2 g via INTRAVENOUS
  Filled 2015-11-06 (×2): qty 2

## 2015-11-06 MED ORDER — PHENYLEPHRINE HCL 10 MG/ML IJ SOLN
30.0000 ug/min | INTRAMUSCULAR | Status: DC
  Administered 2015-11-06: 20 ug/min via INTRAVENOUS
  Administered 2015-11-07 (×2): 40 ug/min via INTRAVENOUS
  Filled 2015-11-06 (×4): qty 1

## 2015-11-06 MED ORDER — DILTIAZEM HCL 25 MG/5ML IV SOLN
INTRAVENOUS | Status: AC
  Administered 2015-11-06: 10 mg via INTRAVENOUS
  Filled 2015-11-06: qty 5

## 2015-11-06 MED ORDER — DAKINS (1/4 STRENGTH) 0.125 % EX SOLN
Freq: Every day | CUTANEOUS | Status: AC
  Administered 2015-11-07 – 2015-11-08 (×2)
  Filled 2015-11-06: qty 473

## 2015-11-06 MED ORDER — ATORVASTATIN CALCIUM 20 MG PO TABS
40.0000 mg | ORAL_TABLET | Freq: Every day | ORAL | Status: DC
Start: 2015-11-06 — End: 2015-11-13
  Administered 2015-11-06 – 2015-11-11 (×5): 40 mg via ORAL
  Filled 2015-11-06 (×6): qty 2

## 2015-11-06 MED ORDER — METOPROLOL TARTRATE 5 MG/5ML IV SOLN
2.5000 mg | INTRAVENOUS | Status: DC | PRN
  Administered 2015-11-08 – 2015-11-09 (×2): 5 mg via INTRAVENOUS
  Filled 2015-11-06 (×3): qty 5

## 2015-11-06 MED ORDER — DILTIAZEM HCL 100 MG IV SOLR
5.0000 mg/h | INTRAVENOUS | Status: DC
  Filled 2015-11-06: qty 100

## 2015-11-06 MED ORDER — POTASSIUM CHLORIDE 10 MEQ/100ML IV SOLN
10.0000 meq | INTRAVENOUS | Status: AC
  Administered 2015-11-06 (×4): 10 meq via INTRAVENOUS
  Filled 2015-11-06 (×4): qty 100

## 2015-11-06 MED ORDER — POTASSIUM CHLORIDE 10 MEQ/100ML IV SOLN
10.0000 meq | INTRAVENOUS | Status: DC
  Filled 2015-11-06 (×4): qty 100

## 2015-11-06 MED ORDER — VANCOMYCIN HCL 10 G IV SOLR
1250.0000 mg | Freq: Once | INTRAVENOUS | Status: AC
  Administered 2015-11-06: 1250 mg via INTRAVENOUS
  Filled 2015-11-06: qty 1250

## 2015-11-06 MED ORDER — HYDROCORTISONE NA SUCCINATE PF 100 MG IJ SOLR
50.0000 mg | Freq: Four times a day (QID) | INTRAMUSCULAR | Status: DC
  Administered 2015-11-06 – 2015-11-08 (×7): 50 mg via INTRAVENOUS
  Filled 2015-11-06 (×8): qty 2

## 2015-11-06 MED ORDER — POTASSIUM CHLORIDE IN NACL 20-0.45 MEQ/L-% IV SOLN
INTRAVENOUS | Status: DC
  Administered 2015-11-06 – 2015-11-07 (×3): via INTRAVENOUS
  Filled 2015-11-06 (×13): qty 1000

## 2015-11-06 MED ORDER — VANCOMYCIN HCL IN DEXTROSE 1-5 GM/200ML-% IV SOLN
1000.0000 mg | INTRAVENOUS | Status: DC
  Administered 2015-11-07: 1000 mg via INTRAVENOUS
  Filled 2015-11-06: qty 200

## 2015-11-06 MED ORDER — POTASSIUM CHLORIDE 10 MEQ/100ML IV SOLN
10.0000 meq | INTRAVENOUS | Status: AC
  Administered 2015-11-06 – 2015-11-07 (×4): 10 meq via INTRAVENOUS
  Filled 2015-11-06 (×4): qty 100

## 2015-11-06 NOTE — Evaluation (Signed)
Clinical/Bedside Swallow Evaluation Patient Details  Name: Matthew Foley MRN: [id number] Date of Birth: 1933-01-25  Today's Date: 11/06/2015 Time: SLP Start Time (ACUTE ONLY): 1700 SLP Stop Time (ACUTE ONLY): 1800 SLP Time Calculation (min) (ACUTE ONLY): 60 min  Past Medical History:  Past Medical History  Diagnosis Date  . Depression     h/o psychiatric hospitalisations prior  . Hypertension   . Hyperlipidemia   . CAD (coronary artery disease)     at least 1 stent  . Hx of pancreatitis   . Rheumatoid arthritis (HCC)   . Hypothyroidism   . GERD (gastroesophageal reflux disease)   . Atrial fibrillation [facility])    Past Surgical History:  Past Surgical History  Procedure Laterality Date  . Knee replacement Bilateral   . Cholecystectomy    . Hernia repair    . Toe amputation      Right, 2nd toe  . Joint replacement     HPI:      Assessment / Plan / Recommendation Clinical Impression  Pt appears at reduced risk for aspiration following general aspiration precautions. Due to his dentition status and baseline difficulty masticating some solids, pt would benefit from a Dysphagia 2 diet w/ thin liquids; NO straws-cup drinking only and assistane holding the cup for drinking; monitoring at meals w/ verbal cues to use single, small sips - slowly.  Recommend meds be given in Puree - whole as tolerates. Monitoring at all meals is recommended for cues to follow through w/ precautions and to assist at meals as needed d/t overall weakness. ST will f/u w/ toleration of diet diet and modify diet as indicated. NSG present in room for much of the evaluation and updated.     Aspiration Risk  Mild aspiration risk (reduced following general aspiration precautions)    Diet Recommendation  Dysphagia 2, thin liquids; aspiration precautions; assistance w/ feeding at all meals sec. to overall weakness.  Medication Administration: Whole meds with puree    Other  Recommendations Recommended  Consults:  (Dietician) Oral Care Recommendations: Oral care BID;Staff/trained caregiver to provide oral care   Follow up Recommendations   (TBD)    Frequency and Duration min 3x week  2 weeks       Prognosis Prognosis for Safe Diet Advancement: Good      Swallow Study   General Date of Onset: 2015-11-11 Type of Study: Bedside Swallow Evaluation Previous Swallow Assessment: none indicated Diet Prior to this Study: Dysphagia 3 (soft);Thin liquids (per pt description) Temperature Spikes Noted: Yes (wbc 20.2) Respiratory Status: Nasal cannula (1-2 liters) History of Recent Intubation: No Behavior/Cognition: Alert;Cooperative;Pleasant mood;Requires cueing (weak) Oral Cavity Assessment: Dry Oral Care Completed by SLP: Recent completion by staff Oral Cavity - Dentition: Missing dentition;Poor condition Vision: Functional for self-feeding Self-Feeding Abilities: Able to feed self;Needs assist;Needs set up (aching w/ movements; stiffness noted) Patient Positioning: Upright in bed Baseline Vocal Quality: Low vocal intensity Volitional Cough: Strong Volitional Swallow: Able to elicit    Oral/Motor/Sensory Function Overall Oral Motor/Sensory Function: Within functional limits   Ice Chips Ice chips: Within functional limits Presentation: Spoon (fed; 3 trials)   Thin Liquid Thin Liquid: Within functional limits Presentation: Cup;Self Fed (assisted w/ holding cup; ~3 ozs(8+ sips))    Nectar Thick Nectar Thick Liquid: Not tested   Honey Thick Honey Thick Liquid: Not tested   Puree Puree: Within functional limits Presentation: Spoon (fed; 10+ trials) Other Comments: gave meds whole in puree w/ NSG present   Solid   GO  Solid: Within functional limits (broken down pieces; moistened in puree) Presentation: Spoon (fed; 4 trials) Other Comments: pt stated he was getting "full"         Jerilynn Som, MS, CCC-SLP  Matthew Foley 11/06/2015,7:17 PM

## 2015-11-06 NOTE — Consult Note (Signed)
Pharmacy Antibiotic Note  Matthew Foley is a 82 y.o. male admitted on 11/07/15 with sepsis.  Pharmacy has been consulted for ceftazidime and vancomycin dosing.  Plan: Vancomycin 1250mg  once. Will start vancomycin 1g q 24 hours 9.5hours after initial dose for stacked dosing Vancomycin 1000 IV every 24 hours.  Goal trough 15-20 mcg/mL. Will draw trough at steady state 5/21 @ 0600  Height: 6' (182.9 cm) Weight: 180 lb (81.647 kg) IBW/kg (Calculated) : 77.6  Temp (24hrs), Avg:100.6 F (38.1 C), Min:99.1 F (37.3 C), Max:102.7 F (39.3 C)   Recent Labs Lab Nov 07, 2015 2320 11/05/15 0336 11/05/15 0350 11/05/15 1331 11/05/15 2317 11/06/15 0300 11/06/15 0748  WBC 19.9*  --  20.4*  --   --  20.2*  --   CREATININE 8.60*  --  8.60* 6.11* 3.33*  --  2.44*  LATICACIDVEN 1.4 1.2  --   --   --   --   --     Estimated Creatinine Clearance: 26.5 mL/min (by C-G formula based on Cr of 2.44).    No Known Allergies  Antimicrobials this admission: ceftaz 5/17 >>  vancomycin 5/17 >>   Dose adjustments this admission:   Microbiology results: 5/17 BCx: pending 5/17 UCx: pending   5/16 MRSA PCR: positive  Thank you for allowing pharmacy to be a part of this patient's care.  6/16, Pharm.D Clinical Pharmacist   11/06/2015 7:58 PM

## 2015-11-06 NOTE — Progress Notes (Signed)
Notified Dr. Juliene Pina that pt potassium 3.2 after replacements and that pt has 20 mgEq K infusing at 140ml/hr in maintanance iv fluids.  Per Dr. Juliene Pina, pharmacy consulted for electrolyte replacements and she will defer to them for replacement.

## 2015-11-06 NOTE — Progress Notes (Signed)
[employer] [facility] -  at [facility]   PATIENT NAME: Matthew Foley    MR#:  [id number]  DATE OF BIRTH:  09-16-1932  SUBJECTIVE:   Patient remains with some confusion, much more alert  REVIEW OF SYSTEMS:    Review of Systems  Unable to perform ROS: critical illness    Tolerating Diet:NPO      DRUG ALLERGIES:  No Known Allergies  VITALS:  Blood pressure 96/76, pulse 111, temperature 99.5 F (37.5 C), temperature source Axillary, resp. rate 24, height 6' (1.829 m), weight 81.647 kg (180 lb), SpO2 100 %.  PHYSICAL EXAMINATION:   Physical Exam  Constitutional: No distress.  Critically ill  HENT:  Head: Normocephalic.  Eyes: No scleral icterus.  Neck: Normal range of motion. Neck supple. No JVD present. No tracheal deviation present.  Cardiovascular: Normal heart sounds.  Exam reveals no gallop and no friction rub.   No murmur heard. Irr, irr tchycardic  Pulmonary/Chest: Effort normal and breath sounds normal. No stridor. No respiratory distress. He has no wheezes. He has no rales. He exhibits no tenderness.  Abdominal: Soft. Bowel sounds are normal. He exhibits no distension and no mass. There is no tenderness. There is no rebound and no guarding.  Musculoskeletal: Normal range of motion. He exhibits no edema.  Neurological: He is alert.  confused  Skin: Skin is warm. No rash noted. He is not diaphoretic. No erythema.  [place]y b/l LEE      LABORATORY PANEL:   CBC  Recent Labs Lab 11/06/15 0300  WBC 20.2*  HGB 11.2*  HCT 32.9*  PLT 141*   ------------------------------------------------------------------------------------------------------------------  Chemistries   Recent Labs Lab 11/10/15 2320  11/06/15 0748  NA 137  < > 147*  K 6.3*  < > 2.6*  CL 105  < > 118*  CO2 16*  < > 19*  GLUCOSE 138*  < > 119*  BUN 127*  < > 59*  CREATININE 8.60*  < > 2.44*  CALCIUM 8.7*  < > 7.5*  MG  --   < > 1.9  AST 28  --   --   ALT 31  --   --    ALKPHOS 69  --   --   BILITOT 0.8  --   --   < > = values in this interval not displayed. ------------------------------------------------------------------------------------------------------------------  Cardiac Enzymes  Recent Labs Lab 11/05/15 0350 11/05/15 1029 11/05/15 1534  TROPONINI 0.62* 3.16* 2.82*   ------------------------------------------------------------------------------------------------------------------  RADIOLOGY:  Ct Head Wo Contrast  11/05/2015  CLINICAL DATA:  Confusion and recent cardioversion EXAM: CT HEAD WITHOUT CONTRAST TECHNIQUE: Contiguous axial images were obtained from the base of the skull through the vertex without intravenous contrast. COMPARISON:  10/28/2015 FINDINGS: Bony calvarium is intact. No gross soft tissue abnormality is seen. Mild atrophic changes are again identified. No findings to suggest acute hemorrhage, acute infarction or space-occupying mass lesion are noted. Fluid is again seen within the right mastoid air cells. IMPRESSION: No significant interval change from the prior exam. Electronically Signed   By: Alcide Clever M.D.   On: 11/05/2015 12:37   US Renal  11/05/2015  CLINICAL DATA:  New EXAM: RENAL / URINARY TRACT ULTRASOUND COMPLETE COMPARISON:  CT, 04/25/2014. FINDINGS: Right Kidney: Length: 11.3 cm. Borderline increased parenchymal echogenicity. Small echogenic foci likely vascular calcifications. No masses. No hydronephrosis. Left Kidney: Length: 9.5 cm. Normal parenchymal echogenicity. No masses or definitive stones. Small lesions seen in the prior CT are not resolved sonographically. No  hydronephrosis. Bladder: Foley catheter in place, with bladder still mildly distended. There is wall thickening, most evident posteriorly. No discrete mass. IMPRESSION: 1. No acute findings.  No hydronephrosis. 2. Small left renal lesions seen on the prior CT are not evident sonographically. No renal masses. 3. Bladder wall thickening. This may be due  to an infectious or inflammatory cystitis. It could be the result of chronic bladder outlet obstruction. No convincing bladder mass. Electronically Signed   By: Amie Portland M.D.   On: 11/05/2015 13:14   Dg Chest Portable 1 View  11/05/2015  CLINICAL DATA:  82 year old male with tachycardia EXAM: PORTABLE CHEST 1 VIEW COMPARISON:  Chest radiograph dated 10/28/2015 FINDINGS: Single portable view of the chest demonstrates emphysematous changes of the lungs. There is no focal consolidation, pleural effusion, or pneumothorax. The cardiac silhouette is within normal limits with no acute osseous pathology. IMPRESSION: No active disease. Electronically Signed   By: Elgie Collard M.D.   On: 11/05/2015 00:30     ASSESSMENT AND PLAN:   82 year old male with a history of essential hypertension, CAD atrial fibrillation who is discharged 4 days ago with acute metabolic encephalopathy from rhabdomyolysis and dehydration who presented with hyperkalemia and wide complex tachycardia. Patient was cardioverted in the emergency room and placed on IV amiodarone drip.  1. Acute metabolic encephalopathy: Likely due to uremia. TSH elevated, therefore we'll order T3 Low but T4 normal  B-12 within normal limits CT head no acute changes 2. Severe Hyperkalemia with EKG changes status post cardioversion: Patient is not hypokalemic. Continue to monitor and replete as needed.   3. ARF due to urinary retention: Creatinine is improving. Continue Foley and Flomax. Continue half-normal saline and appreciate Renal consult. Renal ultrasound showed no evidence of hydronephrosis  4. NSTEMI: Continue Heparin gtt Echocardiogram shows Four-chamber dilated, severe left ventricle systolic dysfunction with left ventricular ejection fraction 25-30% and severe aortic stenosis and moderate mitral regurgitation. Patient is not a candidate for invasive workup at this time. Follow up on cardiology recommendations. Continue aspirin  and statin  5. V tach from hyperkalemia s/p cardioversion now with episodes of atrial fibrillation and RVR: Continue AMIODARONE gtt. Receive 1 dose of IV diltiazem and toxin.   6. Hypothyroid: Increased dose of Synthroid as T3 level is low.  Speech consult pending  CODE STATUS: FULL   CRITICAL CARE TOTAL TIME TAKING CARE OF THIS PATIENT: 38 minutes.   Patient at high risk for cardiac arrest with tachycardia and hypokalemia  POSSIBLE D/C ???, DEPENDING ON CLINICAL CONDITION.   Lurae Hornbrook M.D on 11/06/2015 at 12:09 PM  Between 7am to 6pm - Pager - [phone] After 6pm go to [email or web address] - password [email or web address]  [facility]  Office  [phone]  CC: Primary care physician; Jaclyn Shaggy, MD  Note: This dictation was prepared with Dragon dictation along with smaller phrase technology. Any transcriptional errors that result from this process are unintentional.

## 2015-11-06 NOTE — Consult Note (Signed)
WOC wound consult note Reason for Consult: Unstageable pressure injury to sacrum, right hip and right shoulder.  Sustained a fall 10/29/15 and was found down.  WOunds began at that time and have declined.  Albumin is 2.5 and currently NPO.  Awaiting swallow study.  Wound type:Unstageable pressure injury.  Pressure Ulcer POA: Yes Measurement: Sacrum 7 cm x 5 cm 100% adherent slough with foul odor.  Covers sacrum and bilateral upper buttocks.  Right hip 4 cm x 2 cm 100% adherent slough to wound bed.  Right shoulder 1 cm x 1 cm 100% adherent slough Wound KPV:VZSMOLMBEML tissue.   Drainage (amount, consistency, odor) Moderate purulent drainage.  Periwound:Erythema present circumferentially Dressing procedure/placement/frequency:Cleanse wounds to sacrum, right hip and right shoulder with NS and pat gently dry.  Apply Dakin's  moistened gauze to wound bed (slough).  Cover with dry 4x4 and ABD pad/tape.  Change daily.  Will not follow at this time.  Please re-consult if needed.  Maple Hudson RN BSN [facility]ON Pager [phone]

## 2015-11-06 NOTE — Progress Notes (Signed)
PARENTERAL NUTRITION CONSULT NOTE - INITIAL  Pharmacy Consult for Electrolyte Monitoring Indication: Hypokalemia, Hypomagnesemia  No Known Allergies  Patient Measurements: Height: 6' (182.9 cm) Weight: 180 lb (81.647 kg) IBW/kg (Calculated) : 77.6  Vital Signs: Temp: 100.4 F (38 C) (05/17 2145) Temp Source: Axillary (05/17 1845) BP: 69/46 mmHg (05/17 2130) Pulse Rate: 47 (05/17 2130) Intake/Output from previous day: 05/16 0701 - 05/17 0700 In: 2642.4 [I.V.:2642.4] Out: 3650 [Urine:3650] Intake/Output from this shift:    Labs:  Recent Labs  11/17/2015 2320 11/05/15 0350 11/06/15 0300  WBC 19.9* 20.4* 20.2*  HGB 11.8* 10.9* 11.2*  HCT 35.8* 32.4* 32.9*  PLT 154 135* 141*  APTT 35  --   --   INR 1.38  --   --      Recent Labs  11/18/2015 2320  11/05/15 1331 11/05/15 2206 11/05/15 2317 11/06/15 0748 11/06/15 1449 11/06/15 2042  NA 137  < > 138  --  146* 147*  --   --   K 6.3*  < > 3.9  --  2.6* 2.6* 3.2* 3.3*  CL 105  < > 110  --  119* 118*  --   --   CO2 16*  < > 15*  --  18* 19*  --   --   GLUCOSE 138*  < > 145*  --  122* 119*  --   --   BUN 127*  < > 114*  --  77* 59*  --   --   CREATININE 8.60*  < > 6.11*  --  3.33* 2.44*  --   --   CALCIUM 8.7*  < > 8.0*  --  7.7* 7.5*  --   --   MG  --   --   --  1.5*  --  1.9  --   --   PHOS  --   --   --   --   --  2.9  --   --   PROT 6.0*  --   --   --   --   --   --   --   ALBUMIN 2.5*  --   --   --   --   --   --   --   AST 28  --   --   --   --   --   --   --   ALT 31  --   --   --   --   --   --   --   ALKPHOS 69  --   --   --   --   --   --   --   BILITOT 0.8  --   --   --   --   --   --   --   TRIG  --   --  92  --   --   --   --   --   CHOLHDL  --   --  3.1  --   --   --   --   --   CHOL  --   --  95  --   --   --   --   --   < > = values in this interval not displayed. Estimated Creatinine Clearance: 26.5 mL/min (by C-G formula based on Cr of 2.44).    Recent Labs  11/05/15 0321  GLUCAP 142*     Medical History: Past Medical History  Diagnosis  Date  . Depression     h/o psychiatric hospitalisations prior  . Hypertension   . Hyperlipidemia   . CAD (coronary artery disease)     at least 1 stent  . Hx of pancreatitis   . Rheumatoid arthritis (HCC)   . Hypothyroidism   . GERD (gastroesophageal reflux disease)   . Atrial fibrillation (HCC)     Medications:  Scheduled:  . aspirin EC  81 mg Oral Daily  . atorvastatin  40 mg Oral q1800  . cefTAZidime (FORTAZ)  IV  2 g Intravenous Q24H  . digoxin      . hydrocortisone sodium succinate  50 mg Intravenous Q6H  . levothyroxine  50 mcg Intravenous Daily  . mupirocin ointment   Nasal BID  . pantoprazole (PROTONIX) IV  40 mg Intravenous Q24H  . potassium chloride  10 mEq Intravenous Q1 Hr x 4  . potassium chloride  10 mEq Intravenous Q1 Hr x 4  . sodium chloride flush  3 mL Intravenous Q12H  . sodium hypochlorite   Irrigation Daily  . [START ON 11/07/2015] vancomycin  1,000 mg Intravenous Q24H   Infusions:  . 0.45 % NaCl with KCl 20 mEq / L 125 mL/hr at 11/06/15 0828  . amiodarone 30 mg/hr (11/06/15 1856)  . heparin Stopped (11/06/15 2104)  . phenylephrine (NEO-SYNEPHRINE) Adult infusion 20 mcg/min (11/06/15 2114)    Assessment: 82 y/o M admitted with Vtach from hyperkalemia likely due to urinary obstruction now with hypokalemia, hypermagnesemia, and improving SCr.   Plan:  K= 3.2. Patient has received 7 runs of KCl and is receiving 0.45 NS with 20 meq KCL at 125 ml/hr. Will order another 4 runs of KCl and recheck potassium at 2100.   5/17: K @ 20:42 = 3.3 .   Will order KCl 10 mEq IV X 4 and recheck K on 5/18 @ 3:00.   Matthew Foley D 11/06/2015,9:45 PM

## 2015-11-06 NOTE — Progress Notes (Signed)
Discussed patient's status and medical issues with daughter late today. She also says that patient's cardiologist is Dr Lady Gary.  I will speak with Dr Welton Flakes and Dr Lady Gary tomorrow, since he was already seen by Cardiology team today.

## 2015-11-06 NOTE — Progress Notes (Signed)
PARENTERAL NUTRITION CONSULT NOTE - INITIAL  Pharmacy Consult for Electrolyte Monitoring Indication: Hypokalemia, Hypomagnesemia  No Known Allergies  Patient Measurements: Height: 6' (182.9 cm) Weight: 180 lb (81.647 kg) IBW/kg (Calculated) : 77.6  Vital Signs: Temp: 99.5 F (37.5 C) (05/17 0900) Temp Source: Axillary (05/17 0900) BP: 96/76 mmHg (05/17 1100) Pulse Rate: 111 (05/17 1030) Intake/Output from previous day: 05/16 0701 - 05/17 0700 In: 2642.4 [I.V.:2642.4] Out: 3650 [Urine:3650] Intake/Output from this shift: Total I/O In: -  Out: 750 [Urine:750]  Labs:  Recent Labs  11/26/2015 2320 11/05/15 0350 11/06/15 0300  WBC 19.9* 20.4* 20.2*  HGB 11.8* 10.9* 11.2*  HCT 35.8* 32.4* 32.9*  PLT 154 135* 141*  APTT 35  --   --   INR 1.38  --   --      Recent Labs  Nov 26, 2015 2320  11/05/15 1331 11/05/15 2206 11/05/15 2317 11/06/15 0748  NA 137  < > 138  --  146* 147*  K 6.3*  < > 3.9  --  2.6* 2.6*  CL 105  < > 110  --  119* 118*  CO2 16*  < > 15*  --  18* 19*  GLUCOSE 138*  < > 145*  --  122* 119*  BUN 127*  < > 114*  --  77* 59*  CREATININE 8.60*  < > 6.11*  --  3.33* 2.44*  CALCIUM 8.7*  < > 8.0*  --  7.7* 7.5*  MG  --   --   --  1.5*  --  1.9  PHOS  --   --   --   --   --  2.9  PROT 6.0*  --   --   --   --   --   ALBUMIN 2.5*  --   --   --   --   --   AST 28  --   --   --   --   --   ALT 31  --   --   --   --   --   ALKPHOS 69  --   --   --   --   --   BILITOT 0.8  --   --   --   --   --   TRIG  --   --  92  --   --   --   CHOLHDL  --   --  3.1  --   --   --   CHOL  --   --  95  --   --   --   < > = values in this interval not displayed. Estimated Creatinine Clearance: 26.5 mL/min (by C-G formula based on Cr of 2.44).    Recent Labs  11/05/15 0321  GLUCAP 142*    Medical History: Past Medical History  Diagnosis Date  . Depression     h/o psychiatric hospitalisations prior  . Hypertension   . Hyperlipidemia   . CAD (coronary artery  disease)     at least 1 stent  . Hx of pancreatitis   . Rheumatoid arthritis (HCC)   . Hypothyroidism   . GERD (gastroesophageal reflux disease)   . Atrial fibrillation (HCC)     Medications:  Scheduled:  . ALPRAZolam  0.25 mg Oral BID  . aspirin EC  81 mg Oral Daily  . digoxin  0.25 mg Intravenous Once  . escitalopram  10 mg Oral Daily  . feeding supplement  1  Container Oral TID BM  . ferrous sulfate  325 mg Oral Daily  . levothyroxine  75 mcg Oral QAC breakfast  . mupirocin ointment   Nasal BID  . pantoprazole  40 mg Oral Daily  . potassium chloride  10 mEq Intravenous Q1 Hr x 4  . rOPINIRole  0.25 mg Oral QPM  . simvastatin  20 mg Oral q1800  . sodium chloride flush  3 mL Intravenous Q12H  . tamsulosin  0.4 mg Oral Daily   Infusions:  . 0.45 % NaCl with KCl 20 mEq / L 125 mL/hr at 11/06/15 0828  . amiodarone 59.94 mg/hr (11/06/15 1042)  . heparin 1,400 Units/hr (11/06/15 1042)    Assessment: 82 y/o M admitted with Vtach from hyperkalemia likely due to urinary obstruction now with hypokalemia, hypermagnesemia, and improving SCr.   Plan:  Mag= 1.5, K= 2.6. Electrolytes replaced with 2 g magnesium sulfate and 4 runs of KCl this AM. Will order an additional 4 runs of KCl then recheck potassium at 1500. Patient also receiving 0.45NS w/ 20 meq KCl at 125 ml/hr.   Matthew Foley D 11/06/2015,11:50 AM

## 2015-11-06 NOTE — Progress Notes (Signed)
  SUBJECTIVE: Saw pt this morning. He was alert and talkative. He denied having any chest pain or shortness of breath. He does have generalized discomfort.   Filed Vitals:   11/06/15 1015 11/06/15 1030 11/06/15 1100 11/06/15 1200  BP:  105/78 96/76 109/75  Pulse: 99 111    Temp:      TempSrc:      Resp: 24 29 24 20   Height:      Weight:      SpO2: 100% 100%  100%    Intake/Output Summary (Last 24 hours) at 11/06/15 1256 Last data filed at 11/06/15 1042  Gross per 24 hour  Intake 2639.4 ml  Output   4400 ml  Net -1760.6 ml    LABS: Basic Metabolic Panel:  Recent Labs  11/08/15 2206 11/05/15 2317 11/06/15 0748  NA  --  146* 147*  K  --  2.6* 2.6*  CL  --  119* 118*  CO2  --  18* 19*  GLUCOSE  --  122* 119*  BUN  --  77* 59*  CREATININE  --  3.33* 2.44*  CALCIUM  --  7.7* 7.5*  MG 1.5*  --  1.9  PHOS  --   --  2.9   Liver Function Tests:  Recent Labs  11/12/2015 2320  AST 28  ALT 31  ALKPHOS 69  BILITOT 0.8  PROT 6.0*  ALBUMIN 2.5*   No results for input(s): LIPASE, AMYLASE in the last 72 hours. CBC:  Recent Labs  11/05/15 0350 11/06/15 0300  WBC 20.4* 20.2*  HGB 10.9* 11.2*  HCT 32.4* 32.9*  MCV 95.3 93.8  PLT 135* 141*   Cardiac Enzymes:  Recent Labs  11/05/15 0350 11/05/15 1029 11/05/15 1534  TROPONINI 0.62* 3.16* 2.82*   BNP: Invalid input(s): POCBNP D-Dimer: No results for input(s): DDIMER in the last 72 hours. Hemoglobin A1C: No results for input(s): HGBA1C in the last 72 hours. Fasting Lipid Panel:  Recent Labs  11/05/15 1331  CHOL 95  HDL 31*  LDLCALC 46  TRIG 92  CHOLHDL 3.1   Thyroid Function Tests:  Recent Labs  11/05/15 1331  T3FREE 0.8*   Anemia Panel:  Recent Labs  11/05/15 1331  VITAMINB12 417     PHYSICAL EXAM General: Well developed, well nourished, in no acute distress HEENT:  Normocephalic and atramatic Neck:  No JVD.  Lungs: Clear bilaterally to auscultation and percussion. Heart: HRRR .  Normal S1 and S2 without gallops or murmurs.  Abdomen: Bowel sounds are positive, abdomen soft and non-tender  Msk:  Back normal, normal gait. Normal strength and tone for age. Extremities: No clubbing, cyanosis or edema.   Neuro: Alert and oriented X 3. Psych:  Good affect, responds appropriately  TELEMETRY: atrial fibrillation in the 120's  ASSESSMENT AND PLAN: Pt admitted with wide complex tachycardia requiring cardioversion, probably brought on by hyperkalemia. Currently he is hypokalemic. He has renal failure and now elevated WBC's. Troponins are elevated, but since no chest pain this may be related to the trauma of Vtach and cardioversion. Pt is not a candidate for invasive cardiac procedure. He is back in atrial fib and extra dose of metoprolol and addition of digoxin has brought his heart rate down. WIll continue to follow.  Principal Problem:   Wide-complex tachycardia (HCC) Active Problems:   Altered mental status   Acute renal failure (ARF) (HCC)   Ventricular tachyarrhythmia (HCC)    11/07/15, NP 11/06/2015 12:56 PM

## 2015-11-06 NOTE — Progress Notes (Signed)
Subjective:  Labs are better today Patient is more alert and talking but speech not very clear due to dry mouth    Objective:  Vital signs in last 24 hours:  Temp:  [99.5 F (37.5 C)-100.1 F (37.8 C)] 100.1 F (37.8 C) (05/17 0400) Pulse Rate:  [30-138] 133 (05/17 0800) Resp:  [23-32] 23 (05/17 0800) BP: (94-133)/(60-105) 95/68 mmHg (05/17 0800) SpO2:  [90 %-100 %] 100 % (05/17 0800)  Weight change:  Filed Weights   Nov 09, 2015 2315  Weight: 81.647 kg (180 lb)    Intake/Output:    Intake/Output Summary (Last 24 hours) at 11/06/15 0838 Last data filed at 11/06/15 0700  Gross per 24 hour  Intake 2642.4 ml  Output   3650 ml  Net -1007.6 ml     Physical Exam: General: NAD, laying in bed  HEENT Anicteric, dry mouth  Neck supple  Pulm/lungs Clear, noraml effort  CVS/Heart Iregular, a fib, tachycardic  Abdomen:  Soft, NT, ND  Extremities: No peripheral edema, toe deformities  Neurologic: Alert, follows simple commands  Skin: Heel ulcers          Basic Metabolic Panel:   Recent Labs Lab 11-09-15 2320 11/05/15 0350 11/05/15 1331 11/05/15 2206 11/05/15 2317 11/06/15 0748  NA 137 137 138  --  146* 147*  K 6.3* 6.0* 3.9  --  2.6* 2.6*  CL 105 108 110  --  119* 118*  CO2 16* 15* 15*  --  18* 19*  GLUCOSE 138* 154* 145*  --  122* 119*  BUN 127* 133* 114*  --  77* 59*  CREATININE 8.60* 8.60* 6.11*  --  3.33* 2.44*  CALCIUM 8.7* 8.9 8.0*  --  7.7* 7.5*  MG  --   --   --  1.5*  --  1.9  PHOS  --   --   --   --   --  2.9     CBC:  Recent Labs Lab Nov 09, 2015 2320 11/05/15 0350 11/06/15 0300  WBC 19.9* 20.4* 20.2*  HGB 11.8* 10.9* 11.2*  HCT 35.8* 32.4* 32.9*  MCV 95.6 95.3 93.8  PLT 154 135* 141*      Microbiology:  Recent Results (from the past 720 hour(s))  MRSA PCR Screening     Status: Abnormal   Collection Time: 11/05/15  3:48 AM  Result Value Ref Range Status   MRSA by PCR POSITIVE (A) NEGATIVE Final    Comment:        The GeneXpert  MRSA Assay (FDA approved for NASAL specimens only), is one component of a comprehensive MRSA colonization surveillance program. It is not intended to diagnose MRSA infection nor to guide or monitor treatment for MRSA infections. CALLED TO [facility] EGWUATU @ 0515 ON 11/05/2015 BY CAF     Coagulation Studies:  Recent Labs  2015-11-09 2320  LABPROT 17.1*  INR 1.38    Urinalysis:  Recent Labs  11/05/15 1000  COLORURINE YELLOW*  LABSPEC 1.005  PHURINE 6.0  GLUCOSEU NEGATIVE  HGBUR 1+*  BILIRUBINUR NEGATIVE  KETONESUR NEGATIVE  PROTEINUR NEGATIVE  NITRITE NEGATIVE  LEUKOCYTESUR NEGATIVE      Imaging: Ct Head Wo Contrast  11/05/2015  CLINICAL DATA:  Confusion and recent cardioversion EXAM: CT HEAD WITHOUT CONTRAST TECHNIQUE: Contiguous axial images were obtained from the base of the skull through the vertex without intravenous contrast. COMPARISON:  10/28/2015 FINDINGS: Bony calvarium is intact. No gross soft tissue abnormality is seen. Mild atrophic changes are again identified. No findings to suggest  acute hemorrhage, acute infarction or space-occupying mass lesion are noted. Fluid is again seen within the right mastoid air cells. IMPRESSION: No significant interval change from the prior exam. Electronically Signed   By: Alcide Clever M.D.   On: 11/05/2015 12:37   US Renal  11/05/2015  CLINICAL DATA:  New EXAM: RENAL / URINARY TRACT ULTRASOUND COMPLETE COMPARISON:  CT, 04/25/2014. FINDINGS: Right Kidney: Length: 11.3 cm. Borderline increased parenchymal echogenicity. Small echogenic foci likely vascular calcifications. No masses. No hydronephrosis. Left Kidney: Length: 9.5 cm. Normal parenchymal echogenicity. No masses or definitive stones. Small lesions seen in the prior CT are not resolved sonographically. No hydronephrosis. Bladder: Foley catheter in place, with bladder still mildly distended. There is wall thickening, most evident posteriorly. No discrete mass. IMPRESSION: 1. No  acute findings.  No hydronephrosis. 2. Small left renal lesions seen on the prior CT are not evident sonographically. No renal masses. 3. Bladder wall thickening. This may be due to an infectious or inflammatory cystitis. It could be the result of chronic bladder outlet obstruction. No convincing bladder mass. Electronically Signed   By: Amie Portland M.D.   On: 11/05/2015 13:14   Dg Chest Portable 1 View  11/05/2015  CLINICAL DATA:  82 year old male with tachycardia EXAM: PORTABLE CHEST 1 VIEW COMPARISON:  Chest radiograph dated 10/28/2015 FINDINGS: Single portable view of the chest demonstrates emphysematous changes of the lungs. There is no focal consolidation, pleural effusion, or pneumothorax. The cardiac silhouette is within normal limits with no acute osseous pathology. IMPRESSION: No active disease. Electronically Signed   By: Elgie Collard M.D.   On: 11/05/2015 00:30     Medications:   . 0.45 % NaCl with KCl 20 mEq / L 125 mL/hr at 11/06/15 0828  . amiodarone 59.94 mg/hr (11/06/15 0802)  . heparin 1,400 Units/hr (11/06/15 0802)   . ALPRAZolam  0.25 mg Oral BID  . aspirin EC  81 mg Oral Daily  . escitalopram  10 mg Oral Daily  . feeding supplement  1 Container Oral TID BM  . ferrous sulfate  325 mg Oral Daily  . levothyroxine  75 mcg Oral QAC breakfast  . mupirocin ointment   Nasal BID  . pantoprazole  40 mg Oral Daily  . potassium chloride  10 mEq Intravenous Q1 Hr x 4  . potassium chloride  10 mEq Intravenous Q1 Hr x 4  . rOPINIRole  0.25 mg Oral QPM  . simvastatin  20 mg Oral q1800  . sodium chloride flush  3 mL Intravenous Q12H  . tamsulosin  0.4 mg Oral Daily   acetaminophen **OR** acetaminophen, metoprolol, ondansetron **OR** ondansetron (ZOFRAN) IV  Assessment/ Plan:  82 y.o.caucasian male with a PMHX of Hypertension, hyperlipidemia, coronary disease, rheumatoid arthritis, hypothyroidism, atrial fibrillation, was admitted on 11-28-2015 with A. fib, RVR requiring  cardioversion, acute renal failure with severe hyperkalemia.   1. Acute renal failure. Bedside bladder scan revealed urine greater than 1000 cc. A Foley was placed, over 2300 cc of urine was obtained immediately Therefore, acute renal failure is likely secondary to urinary retention Would continue Flomax Continue IV fluids as patient appears quite dehydrated Continue Foley for now.  S Cr improved to 2.44 UOP 3650  2. Severe hyperkalemia, now Hypokalemia Agree with 1/2 NS with Kcl supplements    LOS: 1 Cosby Proby 5/17/20178:38 AM

## 2015-11-06 NOTE — Progress Notes (Signed)
Pt having shivering and SVT 150-160.  Pt arouses and answers questions.  Notified [facility] and Dr. Juliene Pina.  Pt temperature 102.4.  Per Dr. Juliene Pina obtain pan cultures, give 10 cardizem push, and give tylenol to bring fever down.

## 2015-11-06 NOTE — Progress Notes (Signed)
ANTICOAGULATION CONSULT NOTE - Initial Consult  Pharmacy Consult for heparin drip Indication: chest pain/ACS  No Known Allergies  Patient Measurements: Height: 6' (182.9 cm) Weight: 180 lb (81.647 kg) IBW/kg (Calculated) : 77.6 Heparin Dosing Weight: 82kg  Vital Signs: Temp: 99.5 F (37.5 C) (05/17 0900) Temp Source: Axillary (05/17 0900) BP: 96/76 mmHg (05/17 1100) Pulse Rate: 111 (05/17 1030)  Labs:  Recent Labs  11-25-2015 2320 11/05/15 0350 11/05/15 1029 11/05/15 1331 11/05/15 1534 11/05/15 2317 11/06/15 0300 11/06/15 0748  HGB 11.8* 10.9*  --   --   --   --  11.2*  --   HCT 35.8* 32.4*  --   --   --   --  32.9*  --   PLT 154 135*  --   --   --   --  141*  --   APTT 35  --   --   --   --   --   --   --   LABPROT 17.1*  --   --   --   --   --   --   --   INR 1.38  --   --   --   --   --   --   --   HEPARINUNFRC  --   --   --  0.11*  --  0.46  --  0.51  CREATININE 8.60* 8.60*  --  6.11*  --  3.33*  --  2.44*  TROPONINI 0.30* 0.62* 3.16*  --  2.82*  --   --   --     Estimated Creatinine Clearance: 26.5 mL/min (by C-G formula based on Cr of 2.44).   Medical History: Past Medical History  Diagnosis Date  . Depression     h/o psychiatric hospitalisations prior  . Hypertension   . Hyperlipidemia   . CAD (coronary artery disease)     at least 1 stent  . Hx of pancreatitis   . Rheumatoid arthritis (HCC)   . Hypothyroidism   . GERD (gastroesophageal reflux disease)   . Atrial fibrillation (HCC)     Medications:  No anticoagulation in PTA meds.  Assessment:  Goal of Therapy:  Heparin level 0.3-0.7 units/ml Monitor platelets by anticoagulation protocol: Yes   Plan:  Heparin level remains therapeutic at 0.51. Will continue heparin infusion at 1400 units/hr and f/u HL/CBC in AM.   Luisa Hart D 11/06/2015,11:58 AM

## 2015-11-06 NOTE — Progress Notes (Signed)
Consult to intensivist, Spoke with [facility] and per [facility] Dr. Sung Amabile has been notified and will come to see pt.  Pt sbp in 80's, map 65-70.  Remains in SVT 140's, unable to start cardizem drip at this time due to hypotension.

## 2015-11-06 NOTE — Progress Notes (Signed)
PARENTERAL NUTRITION CONSULT NOTE - INITIAL  Pharmacy Consult for Electrolyte Monitoring Indication: Hypokalemia, Hypomagnesemia  No Known Allergies  Patient Measurements: Height: 6' (182.9 cm) Weight: 180 lb (81.647 kg) IBW/kg (Calculated) : 77.6  Vital Signs: Temp: 99.1 F (37.3 C) (05/17 1500) Temp Source: Axillary (05/17 1500) BP: 110/67 mmHg (05/17 1500) Pulse Rate: 118 (05/17 1400) Intake/Output from previous day: 05/16 0701 - 05/17 0700 In: 2642.4 [I.V.:2642.4] Out: 3650 [Urine:3650] Intake/Output from this shift: Total I/O In: -  Out: 750 [Urine:750]  Labs:  Recent Labs  21-Nov-2015 2320 11/05/15 0350 11/06/15 0300  WBC 19.9* 20.4* 20.2*  HGB 11.8* 10.9* 11.2*  HCT 35.8* 32.4* 32.9*  PLT 154 135* 141*  APTT 35  --   --   INR 1.38  --   --      Recent Labs  11/21/2015 2320  11/05/15 1331 11/05/15 2206 11/05/15 2317 11/06/15 0748 11/06/15 1449  NA 137  < > 138  --  146* 147*  --   K 6.3*  < > 3.9  --  2.6* 2.6* 3.2*  CL 105  < > 110  --  119* 118*  --   CO2 16*  < > 15*  --  18* 19*  --   GLUCOSE 138*  < > 145*  --  122* 119*  --   BUN 127*  < > 114*  --  77* 59*  --   CREATININE 8.60*  < > 6.11*  --  3.33* 2.44*  --   CALCIUM 8.7*  < > 8.0*  --  7.7* 7.5*  --   MG  --   --   --  1.5*  --  1.9  --   PHOS  --   --   --   --   --  2.9  --   PROT 6.0*  --   --   --   --   --   --   ALBUMIN 2.5*  --   --   --   --   --   --   AST 28  --   --   --   --   --   --   ALT 31  --   --   --   --   --   --   ALKPHOS 69  --   --   --   --   --   --   BILITOT 0.8  --   --   --   --   --   --   TRIG  --   --  92  --   --   --   --   CHOLHDL  --   --  3.1  --   --   --   --   CHOL  --   --  95  --   --   --   --   < > = values in this interval not displayed. Estimated Creatinine Clearance: 26.5 mL/min (by C-G formula based on Cr of 2.44).    Recent Labs  11/05/15 0321  GLUCAP 142*    Medical History: Past Medical History  Diagnosis Date  .  Depression     h/o psychiatric hospitalisations prior  . Hypertension   . Hyperlipidemia   . CAD (coronary artery disease)     at least 1 stent  . Hx of pancreatitis   . Rheumatoid arthritis (HCC)   . Hypothyroidism   .  GERD (gastroesophageal reflux disease)   . Atrial fibrillation (HCC)     Medications:  Scheduled:  . ALPRAZolam  0.25 mg Oral BID  . aspirin EC  81 mg Oral Daily  . atorvastatin  40 mg Oral q1800  . escitalopram  10 mg Oral Daily  . feeding supplement  1 Container Oral TID BM  . ferrous sulfate  325 mg Oral Daily  . [START ON 11/07/2015] levothyroxine  88 mcg Oral QAC breakfast  . mupirocin ointment   Nasal BID  . pantoprazole  40 mg Oral Daily  . potassium chloride  10 mEq Intravenous Q1 Hr x 4  . rOPINIRole  0.25 mg Oral QPM  . sodium chloride flush  3 mL Intravenous Q12H  . sodium hypochlorite   Irrigation Daily  . tamsulosin  0.4 mg Oral Daily   Infusions:  . 0.45 % NaCl with KCl 20 mEq / L 125 mL/hr at 11/06/15 0828  . amiodarone 30 mg/hr (11/06/15 1400)  . heparin 1,400 Units/hr (11/06/15 1400)    Assessment: 82 y/o M admitted with Vtach from hyperkalemia likely due to urinary obstruction now with hypokalemia, hypermagnesemia, and improving SCr.   Plan:  K= 3.2. Patient has received 7 runs of KCl and is receiving 0.45 NS with 20 meq KCL at 125 ml/hr. Will order another 4 runs of KCl and recheck potassium at 2100.   Matthew Foley D 11/06/2015,3:36 PM

## 2015-11-06 NOTE — Progress Notes (Signed)
Dr. Sung Amabile now at bedside assessing pt.  Per Dr. Sung Amabile he will obtain CXR, procalcitonin, 500 ns bolus, scheduled tylenol iv for fever, digoxin.  Dr Sung Amabile called and updated pt daughters on change in status.  Urine culture already sent, awaiting blood cultures to be drawn.  Dr. Sung Amabile also examined pt wounds to back, hip, and sacrum, as likely source of sepsis.

## 2015-11-06 NOTE — Progress Notes (Signed)
ANTICOAGULATION CONSULT NOTE - Initial Consult  Pharmacy Consult for heparin drip Indication: chest pain/ACS  No Known Allergies  Patient Measurements: Height: 6' (182.9 cm) Weight: 180 lb (81.647 kg) IBW/kg (Calculated) : 77.6 Heparin Dosing Weight: 82kg  Vital Signs: Temp: 100 F (37.8 C) (05/17 0000) Temp Source: Axillary (05/17 0000) BP: 119/73 mmHg (05/17 0300) Pulse Rate: 85 (05/17 0300)  Labs:  Recent Labs  04-Dec-2015 2320 11/05/15 0350 11/05/15 1029 11/05/15 1331 11/05/15 1534 11/05/15 2317 11/06/15 0300  HGB 11.8* 10.9*  --   --   --   --  11.2*  HCT 35.8* 32.4*  --   --   --   --  32.9*  PLT 154 135*  --   --   --   --  141*  APTT 35  --   --   --   --   --   --   LABPROT 17.1*  --   --   --   --   --   --   INR 1.38  --   --   --   --   --   --   HEPARINUNFRC  --   --   --  0.11*  --  0.46  --   CREATININE 8.60* 8.60*  --  6.11*  --  3.33*  --   TROPONINI 0.30* 0.62* 3.16*  --  2.82*  --   --     Estimated Creatinine Clearance: 19.4 mL/min (by C-G formula based on Cr of 3.33).   Medical History: Past Medical History  Diagnosis Date  . Depression     h/o psychiatric hospitalisations prior  . Hypertension   . Hyperlipidemia   . CAD (coronary artery disease)     at least 1 stent  . Hx of pancreatitis   . Rheumatoid arthritis (HCC)   . Hypothyroidism   . GERD (gastroesophageal reflux disease)   . Atrial fibrillation (HCC)     Medications:  No anticoagulation in PTA meds.  Assessment:  Goal of Therapy:  Heparin level 0.3-0.7 units/ml Monitor platelets by anticoagulation protocol: Yes   Plan:  Heparin level below goal at 0.11. Will bolus heparin 2400 units iv once and increase heparin infusion to 1400 units/hr. Next HL in 8 hours.  5/16 23:00 heparin level 0.46. Recheck in 8 hours to confirm.  Leiam Hopwood S 11/06/2015,4:55 AM

## 2015-11-06 NOTE — Progress Notes (Signed)
Pt remains in SVT, now hypotensive post cardizem push.  Consult to intensivist.  Will notifify [facility] of consult.

## 2015-11-06 NOTE — Progress Notes (Signed)
eLink Physician-Brief Progress Note Patient Name: Matthew Foley DOB: July 06, 1932 MRN: [id number]   Date of Service  11/06/2015  HPI/Events of Note  Hypotensive  eICU Interventions  Stress dose steroid Neo, if >75 then will need TLC, RN to notify us. Hold heparin. 500 ml NS IV bolus.     Intervention Category Major Interventions: Other:  Inanna Telford 11/06/2015, 8:59 PM

## 2015-11-06 NOTE — Progress Notes (Signed)
Pt given two ice chips for dry mouth and to asses swallowing since pt more alert now.  Pt has some coughing with ice chips, keep npo.  Spoke with Dr. Juliene Pina regarding pt coughing with ice chips, and per Mody keep pt npo and hold po meds till speech sees pt.

## 2015-11-06 NOTE — Consult Note (Addendum)
PULMONARY / CRITICAL CARE MEDICINE   Name: Matthew Foley MRN: [id number] DOB: [birth year]    ADMISSION DATE:  11/07/2015 CONSULTATION DATE:  11/06/15  REFERRING MD:  Hospitalist  PT PROFILE: 33 M admitted 05/15 from rehab facility with AMS, AKI, hyperkalemia, AFRVR. A Foley catheter was placed 05/16 with 2300 cc urine immediately drained and significant improvement in renal function. In the evening of 05/17, he developed fever, tachycardia, hypotension and is noted to have a foul smelling pressure ulcer on his sacrum  MAJOR EVENTS/TEST RESULTS: 05/15 admitted as above 05/16 Cardiology and Renal consultations. Foley catheter placed - 2300 cc urine 05/16 Renal US: no hydronephrosis 05/16 CT head: NAD 05/16 TTE: The right ventricular systolic pressure was increased consistent with moderate pulmonary hypertension. Four-chamber dilated patient with severe left ventricle systolic dysfunction with left ventricular ejection fraction 25-30% and severe aortic stenosis and moderate mitral regurgitation 05/17 Developed septic shock - thought due to infected pressure ulcer. PCCM consultation  INDWELLING DEVICES::   MICRO DATA: MRSA PCR 05/16 >> NEG Urine 05/16 >> staph aureus Urine 05/17 >> Blood 05/17 >>    ANTIMICROBIALS:  Vanc 05/17 >>  Ceftaz 05/17 >>   HISTORY OF PRESENT ILLNESS:   Pt is unable to provide history. I have reviewed all relevant hospital records  PAST MEDICAL HISTORY :  He  has a past medical history of Depression; Hypertension; Hyperlipidemia; CAD (coronary artery disease); pancreatitis; Rheumatoid arthritis (HCC); Hypothyroidism; GERD (gastroesophageal reflux disease); and Atrial fibrillation (HCC).  PAST SURGICAL HISTORY: He  has past surgical history that includes knee replacement (Bilateral); Cholecystectomy; Hernia repair; Toe amputation; and Joint replacement.  No Known Allergies  No current facility-administered medications on file prior to encounter.    Current Outpatient Prescriptions on File Prior to Encounter  Medication Sig  . ALPRAZolam (XANAX) 0.5 MG tablet Take 0.5 mg by mouth 2 (two) times daily.  [name] aspirin EC 81 MG EC tablet Take 1 tablet (81 mg total) by mouth daily.  [name] escitalopram (LEXAPRO) 10 MG tablet Take 1 tablet (10 mg total) by mouth daily.  . feeding supplement (BOOST / RESOURCE BREEZE) LIQD Take 1 Container by mouth 3 (three) times daily between meals.  . ferrous sulfate 325 (65 FE) MG tablet Take 325 mg by mouth daily.  [name] ketoconazole (NIZORAL) 2 % shampoo Apply 1 application topically once a week.  . levothyroxine (SYNTHROID, LEVOTHROID) 75 MCG tablet Take 75 mcg by mouth daily before breakfast.  . lisinopril (PRINIVIL,ZESTRIL) 2.5 MG tablet Take 2.5 mg by mouth daily.  . Multiple Vitamin (MULTIVITAMIN WITH MINERALS) TABS tablet Take 1 tablet by mouth daily.  . pantoprazole (PROTONIX) 40 MG tablet Take 40 mg by mouth daily.  . potassium chloride SA (K-DUR,KLOR-CON) 20 MEQ tablet Take 20 mEq by mouth 2 (two) times daily.  [name] rOPINIRole (REQUIP) 0.25 MG tablet Take 0.25 mg by mouth every evening.  . tamsulosin (FLOMAX) 0.4 MG CAPS capsule Take 0.4 mg by mouth daily.  [name] ALPRAZolam (XANAX) 0.25 MG tablet Take 0.5 tablets (0.125 mg total) by mouth 2 (two) times daily.    FAMILY HISTORY:  His has no family status information on file.   SOCIAL HISTORY: He  reports that he has quit smoking. He has quit using smokeless tobacco. He reports that he does not drink alcohol or use illicit drugs.  REVIEW OF SYSTEMS:   Level 5 caveat  SUBJECTIVE:    VITAL SIGNS: BP 98/62 mmHg  Pulse 114  Temp(Src) 102.7 F (39.3 C) (Axillary)  Resp 31  Ht 6' (1.829 m)  Wt 81.647 kg (180 lb)  BMI 24.41 kg/m2  SpO2 100%  HEMODYNAMICS:    VENTILATOR SETTINGS:    INTAKE / OUTPUT: I/O last 3 completed shifts: In: 2767.4 [I.V.:2767.4] Out: 4400 [Urine:4400]  PHYSICAL EXAMINATION: General: Frail appearing, no respiratory  distress, RASS -4 Neuro: PERRL, EOMI, MAEs, DTRs symmetric HEENT: NCAT Cardiovascular: tachy, IRIR, + S3, + systolic M Lungs: No adventitious sounds Abdomen: scaphoid, soft, diminished BS, NT Ext: chronic stasis changes, no LE edema Skin: several pressure ulcers on backside, the one over sacrum is very malodorous  LABS:  BMET  Recent Labs Lab 11/05/15 1331 11/05/15 2317 11/06/15 0748 11/06/15 1449 11/06/15 2042  NA 138 146* 147*  --   --   K 3.9 2.6* 2.6* 3.2* 3.3*  CL 110 119* 118*  --   --   CO2 15* 18* 19*  --   --   BUN 114* 77* 59*  --   --   CREATININE 6.11* 3.33* 2.44*  --   --   GLUCOSE 145* 122* 119*  --   --     Electrolytes  Recent Labs Lab 11/05/15 1331 11/05/15 2206 11/05/15 2317 11/06/15 0748  CALCIUM 8.0*  --  7.7* 7.5*  MG  --  1.5*  --  1.9  PHOS  --   --   --  2.9    CBC  Recent Labs Lab 11/16/2015 2320 11/05/15 0350 11/06/15 0300  WBC 19.9* 20.4* 20.2*  HGB 11.8* 10.9* 11.2*  HCT 35.8* 32.4* 32.9*  PLT 154 135* 141*    Coag's  Recent Labs Lab 10/28/2015 2320  APTT 35  INR 1.38    Sepsis Markers  Recent Labs Lab 10/21/2015 2320 11/05/15 0336  LATICACIDVEN 1.4 1.2    ABG No results for input(s): PHART, PCO2ART, PO2ART in the last 168 hours.  Liver Enzymes  Recent Labs Lab 11/11/2015 2320  AST 28  ALT 31  ALKPHOS 69  BILITOT 0.8  ALBUMIN 2.5*    Cardiac Enzymes  Recent Labs Lab 11/05/15 0350 11/05/15 1029 11/05/15 1534  TROPONINI 0.62* 3.16* 2.82*    Glucose  Recent Labs Lab 11/05/15 0321  GLUCAP 142*    Imaging Dg Chest Port 1 View  11/06/2015  CLINICAL DATA:  Atrial fibrillation and hypertension. EXAM: PORTABLE CHEST 1 VIEW COMPARISON:  Nov 04, 2015 FINDINGS: There is patchy infiltrate in the left base at the level of the left hemidiaphragm. The lungs elsewhere are clear. Heart is upper normal in size with pulmonary vascularity within normal limits. No adenopathy. No bone lesions. IMPRESSION: Patchy  infiltrate left base. Lungs elsewhere clear. Stable cardiac silhouette. Electronically Signed   By: Bretta Bang III M.D.   On: 11/06/2015 20:18     ASSESSMENT / PLAN:  PULMONARY A: Mild acute hypoxic resp failure - suspect atelectasis. No evidence of PNA or edema P:   Continue supplemental O2  CARDIOVASCULAR A:  Septic shock Severe cardiomyopathy (LVEF 25%) Aortic stenosis, mitral regurgitation AF with RVR - exacerbated by fever P:  NS bolus 05/17 Empiric HC 100 mg X 1 05/17 Phenylephrine to maintain MAP > 65 mmHg  Might require CVL placement depending on dose requirements and goals of care  Continue amiodarone Digoxin X 1 dose 05/17 Low dose metoprolol to maintain HR < 120/min Hold heparin in case he needs CVL  RENAL A:   Obstructive uropathy Bladder outlet obstruction AKI Hyperkalemia, resolved Post obstructive diuresis Hypokalemia Hypernatremia P:  Monitor BMET intermittently Monitor I/Os Correct electrolytes as indicated Renal service following  GASTROINTESTINAL A:   Dysphagia due to AMS P:   SUP: IV PPI  Change meds to IV route where able NPO until cognition improves  HEMATOLOGIC A:   Mild thrombocytopenia P:  DVT px: holding full dose heparin for possible procedure DVT px: SQ heparin Monitor CBC intermittently Transfuse per usual guidelines  INFECTIOUS A:   Severe sepsis Suspected pressure ulcer infection P:   Monitor temp, WBC count Micro and abx as above  ENDOCRINE A:   Hypothyroidism P:   Change L-thyroxine to IV until able to take POs  NEUROLOGIC A:   Acute enephalopathy - TME and septic P:   RASS goal: 0 Minimize sedating agents   FAMILY: I spoke with daughter over phone. We will meet tomorrow and discuss goal of care  CCM time: 45 mins The above time includes time spent in consultation with patient and/or family members and reviewing care plan on multidisciplinary rounds  Billy Fischer, MD PCCM service Mobile  [phone] Pager [phone]  11/06/2015, 9:42 PM

## 2015-11-06 NOTE — Progress Notes (Signed)
eLink Physician Progress Note and Electrolyte Replacement  Patient Name: Matthew Foley DOB: August 06, 1932 MRN: [id number]  Date of Service  11/06/2015   HPI/Events of Note    Recent Labs Lab 10/30/15 1013  12/02/15 2320 11/05/15 0350 11/05/15 1331 11/05/15 2206 11/05/15 2317  NA  --   --  137 137 138  --  146*  K  --   < > 6.3* 6.0* 3.9  --  2.6*  CL  --   --  105 108 110  --  119*  CO2  --   --  16* 15* 15*  --  18*  GLUCOSE  --   --  138* 154* 145*  --  122*  BUN  --   --  127* 133* 114*  --  77*  CREATININE 1.44*  --  8.60* 8.60* 6.11*  --  3.33*  CALCIUM  --   --  8.7* 8.9 8.0*  --  7.7*  MG  --   --   --   --   --  1.5*  --   < > = values in this interval not displayed.  Estimated Creatinine Clearance: 19.4 mL/min (by C-G formula based on Cr of 3.33).  Intake/Output      05/16 0701 - 05/17 0700   I.V. (mL/kg) 2542.4 (31.1)   Total Intake(mL/kg) 2542.4 (31.1)   Urine (mL/kg/hr) 2000 (1)   Total Output 2000   Net +542.4        - I/O DETAILED x 24h    Total I/O In: 779 [I.V.:779] Out: -  - I/O THIS SHIFT    ASSESSMENT Low K ATN - improving Earlier low mag replaced  eICURN Interventions  kcl  X 1 IV   ASSESSMENT: MAJOR ELECTROLYTE      Dr. Kalman Shan, M.D., [facility].C.P Pulmonary and Critical Care Medicine Staff Physician [facility] Pager: [phone], If no answer or between  15:00h - 7:00h: call [phone]  11/06/2015 4:01 AM

## 2015-11-07 DIAGNOSIS — I472 Ventricular tachycardia: Secondary | ICD-10-CM

## 2015-11-07 DIAGNOSIS — R7881 Bacteremia: Secondary | ICD-10-CM

## 2015-11-07 DIAGNOSIS — B9562 Methicillin resistant Staphylococcus aureus infection as the cause of diseases classified elsewhere: Secondary | ICD-10-CM

## 2015-11-07 LAB — BLOOD CULTURE ID PANEL (REFLEXED)
Acinetobacter baumannii: NOT DETECTED
CANDIDA ALBICANS: NOT DETECTED
CANDIDA GLABRATA: NOT DETECTED
CANDIDA PARAPSILOSIS: NOT DETECTED
Candida krusei: NOT DETECTED
Candida tropicalis: NOT DETECTED
Carbapenem resistance: NOT DETECTED
ENTEROBACTER CLOACAE COMPLEX: NOT DETECTED
ENTEROBACTERIACEAE SPECIES: NOT DETECTED
ENTEROCOCCUS SPECIES: NOT DETECTED
ESCHERICHIA COLI: NOT DETECTED
Haemophilus influenzae: NOT DETECTED
KLEBSIELLA PNEUMONIAE: NOT DETECTED
Klebsiella oxytoca: NOT DETECTED
Listeria monocytogenes: NOT DETECTED
METHICILLIN RESISTANCE: DETECTED — AB
Neisseria meningitidis: NOT DETECTED
PSEUDOMONAS AERUGINOSA: NOT DETECTED
Proteus species: NOT DETECTED
STAPHYLOCOCCUS AUREUS BCID: DETECTED — AB
STREPTOCOCCUS AGALACTIAE: NOT DETECTED
STREPTOCOCCUS PNEUMONIAE: NOT DETECTED
Serratia marcescens: NOT DETECTED
Staphylococcus species: DETECTED — AB
Streptococcus pyogenes: NOT DETECTED
Streptococcus species: NOT DETECTED
VANCOMYCIN RESISTANCE: NOT DETECTED

## 2015-11-07 LAB — MAGNESIUM: Magnesium: 1.6 mg/dL — ABNORMAL LOW (ref 1.7–2.4)

## 2015-11-07 LAB — URINE CULTURE

## 2015-11-07 LAB — BASIC METABOLIC PANEL
Anion gap: 6 (ref 5–15)
BUN: 44 mg/dL — ABNORMAL HIGH (ref 6–20)
CHLORIDE: 120 mmol/L — AB (ref 101–111)
CO2: 18 mmol/L — AB (ref 22–32)
CREATININE: 1.52 mg/dL — AB (ref 0.61–1.24)
Calcium: 7.1 mg/dL — ABNORMAL LOW (ref 8.9–10.3)
GFR calc non Af Amer: 42 mL/min — ABNORMAL LOW (ref >60)
GFR, EST AFRICAN AMERICAN: 48 mL/min — AB (ref >60)
Glucose, Bld: 146 mg/dL — ABNORMAL HIGH (ref 65–99)
Potassium: 4.4 mmol/L (ref 3.5–5.1)
Sodium: 144 mmol/L (ref 135–145)

## 2015-11-07 LAB — CBC
HCT: 31.7 % — ABNORMAL LOW (ref 40.0–52.0)
HEMOGLOBIN: 10.6 g/dL — AB (ref 13.0–18.0)
MCH: 31.6 pg (ref 26.0–34.0)
MCHC: 33.4 g/dL (ref 32.0–36.0)
MCV: 94.5 fL (ref 80.0–100.0)
Platelets: 91 10*3/uL — ABNORMAL LOW (ref 150–440)
RBC: 3.36 MIL/uL — AB (ref 4.40–5.90)
RDW: 14.9 % — ABNORMAL HIGH (ref 11.5–14.5)
WBC: 25.7 10*3/uL — AB (ref 3.8–10.6)

## 2015-11-07 LAB — PHOSPHORUS: PHOSPHORUS: 2.1 mg/dL — AB (ref 2.5–4.6)

## 2015-11-07 LAB — HEPARIN LEVEL (UNFRACTIONATED)

## 2015-11-07 LAB — PROCALCITONIN: Procalcitonin: 16.28 ng/mL

## 2015-11-07 MED ORDER — DEXTROSE 5 % IV SOLN
2.0000 g | Freq: Two times a day (BID) | INTRAVENOUS | Status: DC
  Administered 2015-11-07 – 2015-11-08 (×3): 2 g via INTRAVENOUS
  Filled 2015-11-07 (×4): qty 2

## 2015-11-07 MED ORDER — MAGNESIUM SULFATE 4 GM/100ML IV SOLN
4.0000 g | Freq: Once | INTRAVENOUS | Status: DC
  Filled 2015-11-07: qty 100

## 2015-11-07 MED ORDER — HEPARIN SODIUM (PORCINE) 5000 UNIT/ML IJ SOLN: 5000.0000 [IU] | Freq: Three times a day (TID) | INTRAMUSCULAR | Status: DC

## 2015-11-07 MED ORDER — SODIUM PHOSPHATES 45 MMOLE/15ML IV SOLN
20.0000 mmol | Freq: Once | INTRAVENOUS | Status: AC
  Administered 2015-11-07: 20 mmol via INTRAVENOUS
  Filled 2015-11-07: qty 6.67

## 2015-11-07 MED ORDER — HEPARIN SODIUM (PORCINE) 5000 UNIT/ML IJ SOLN
5000.0000 [IU] | Freq: Three times a day (TID) | INTRAMUSCULAR | Status: DC
  Administered 2015-11-07 – 2015-11-08 (×4): 5000 [IU] via SUBCUTANEOUS
  Filled 2015-11-07 (×4): qty 1

## 2015-11-07 MED ORDER — PHENYLEPHRINE HCL 10 MG/ML IJ SOLN
0.0000 ug/min | INTRAVENOUS | Status: DC
  Administered 2015-11-07: 25 ug/min via INTRAVENOUS
  Administered 2015-11-07: 15 ug/min via INTRAVENOUS
  Filled 2015-11-07: qty 1

## 2015-11-07 MED ORDER — MAGNESIUM SULFATE 2 GM/50ML IV SOLN
2.0000 g | Freq: Once | INTRAVENOUS | Status: AC
  Administered 2015-11-07: 2 g via INTRAVENOUS
  Filled 2015-11-07: qty 50

## 2015-11-07 MED ORDER — VANCOMYCIN HCL IN DEXTROSE 1-5 GM/200ML-% IV SOLN
1000.0000 mg | INTRAVENOUS | Status: DC
  Administered 2015-11-07: 1000 mg via INTRAVENOUS
  Filled 2015-11-07 (×2): qty 200

## 2015-11-07 NOTE — Progress Notes (Signed)
[facility] [place] [facility]   PATIENT NAME: Matthew Foley    MR#:  [id number]  DATE OF BIRTH:  09/19/1932  SUBJECTIVE:   Patient with increased work of breathing Tarted on pressors overnight for septic shock  REVIEW OF SYSTEMS:    Review of Systems  Unable to perform ROS: critical illness    Tolerating Diet:NPO      DRUG ALLERGIES:  No Known Allergies  VITALS:  Blood pressure 106/74, pulse 106, temperature 99 F (37.2 C), temperature source Rectal, resp. rate 31, height 6' (1.829 m), weight 81.647 kg (180 lb), SpO2 100 %.  PHYSICAL EXAMINATION:   Physical Exam  Constitutional: No distress.  Critically ill  HENT:  Head: Normocephalic.  Eyes: No scleral icterus.  Neck: Normal range of motion. Neck supple. No JVD present. No tracheal deviation present.  Cardiovascular: Normal heart sounds.  Exam reveals no gallop and no friction rub.   No murmur heard. Irr, irr tchycardic  Pulmonary/Chest: Effort normal. No stridor. No respiratory distress. He has wheezes. He has rales. He exhibits no tenderness.  Rhonchi bilaterally  Abdominal: Soft. Bowel sounds are normal. He exhibits no distension and no mass. There is no tenderness. There is no rebound and no guarding.  Musculoskeletal: Normal range of motion. He exhibits no edema.  Neurological: He is alert.  confused  Skin: Skin is warm. No rash noted. He is not diaphoretic. No erythema.  Tawny b/l LEE      LABORATORY PANEL:   CBC  Recent Labs Lab 11/07/15 0321  WBC 25.7*  HGB 10.6*  HCT 31.7*  PLT 91*   ------------------------------------------------------------------------------------------------------------------  Chemistries   Recent Labs Lab 28-Nov-2015 2320  11/07/15 0321  NA 137  < > 144  K 6.3*  < > 4.4  CL 105  < > 120*  CO2 16*  < > 18*  GLUCOSE 138*  < > 146*  BUN 127*  < > 44*  CREATININE 8.60*  < > 1.52*  CALCIUM 8.7*  < > 7.1*  MG  --   < > 1.6*  AST 28  --    --   ALT 31  --   --   ALKPHOS 69  --   --   BILITOT 0.8  --   --   < > = values in this interval not displayed. ------------------------------------------------------------------------------------------------------------------  Cardiac Enzymes  Recent Labs Lab 11/05/15 0350 11/05/15 1029 11/05/15 1534  TROPONINI 0.62* 3.16* 2.82*   ------------------------------------------------------------------------------------------------------------------  RADIOLOGY:  US Renal  11/05/2015  CLINICAL DATA:  New EXAM: RENAL / URINARY TRACT ULTRASOUND COMPLETE COMPARISON:  CT, 04/25/2014. FINDINGS: Right Kidney: Length: 11.3 cm. Borderline increased parenchymal echogenicity. Small echogenic foci likely vascular calcifications. No masses. No hydronephrosis. Left Kidney: Length: 9.5 cm. Normal parenchymal echogenicity. No masses or definitive stones. Small lesions seen in the prior CT are not resolved sonographically. No hydronephrosis. Bladder: Foley catheter in place, with bladder still mildly distended. There is wall thickening, most evident posteriorly. No discrete mass. IMPRESSION: 1. No acute findings.  No hydronephrosis. 2. Small left renal lesions seen on the prior CT are not evident sonographically. No renal masses. 3. Bladder wall thickening. This may be due to an infectious or inflammatory cystitis. It could be the result of chronic bladder outlet obstruction. No convincing bladder mass. Electronically Signed   By: Amie Portland M.D.   On: 11/05/2015 13:14   Dg Chest Port 1 View  11/06/2015  CLINICAL DATA:  Atrial fibrillation and hypertension.  EXAM: PORTABLE CHEST 1 VIEW COMPARISON:  2015/11/26 FINDINGS: There is patchy infiltrate in the left base at the level of the left hemidiaphragm. The lungs elsewhere are clear. Heart is upper normal in size with pulmonary vascularity within normal limits. No adenopathy. No bone lesions. IMPRESSION: Patchy infiltrate left base. Lungs elsewhere clear. Stable  cardiac silhouette. Electronically Signed   By: Bretta Bang III M.D.   On: 11/06/2015 20:18     ASSESSMENT AND PLAN:   82 year old male with a history of essential hypertension, CAD atrial fibrillation who is discharged 4 days ago with acute metabolic encephalopathy from rhabdomyolysis and dehydration who presented with hyperkalemia and wide complex tachycardia. Patient was cardioverted in the emergency room and placed on IV amiodarone drip.   1. Septic shock due to pneumonia: Continue phenylephrine Continue broad-spectrum antibiotics, including Fortaz and vancomycin Follow up on blood cultures  2. Acute metabolic encephalopathy: Likely due to uremia and now progressive with septic shock and ICU delirium. TSH elevated, therefore we'll order T3 Low but T4 normal  B-12 within normal limits CT head no acute changes  3. Severe Hyperkalemia with EKG changes status post cardioversion: Patient is no longer hypo-or hyperkalemic.[name]   3. ARF due to urinary retention: Creatinine is improving. Continue Foley and Flomax. Renal ultrasound showed no evidence of hydronephrosis  4. NSTEMI: Off of Heparin gtt Echocardiogram shows Four-chamber dilated, severe left ventricle systolic dysfunction with left ventricular ejection fraction 25-30% and severe aortic stenosis and moderate mitral regurgitation. Patient is not a candidate for invasive workup at this time. Follow up on cardiology recommendations. Continue aspirin and statin  5. V tach from hyperkalemia s/p cardioversion now with episodes of atrial fibrillation and RVR: Continue AMIODARONE gtt. Receive 1 dose of IV diltiazem and toxin.   6. Hypothyroid: I Increased dose of Synthroid as T3 level is low.  Speech consult pending  CODE STATUS: FULL   CRITICAL CARE TOTAL TIME TAKING CARE OF THIS PATIENT: 38 minutes.   Patient at high risk for cardiac arrest with tachycardia and hypokalemia  Transferred to CCU intensivist POSSIBLE  D/C ???, DEPENDING ON CLINICAL CONDITION.   Mavery Milling M.D on 11/07/2015 at 12:45 PM  Between 7am to 6pm - Pager - [phone] After 6pm go to [email or web address] - password [email or web address]  [facility]  Office  [phone]  CC: Primary care physician; Jaclyn Shaggy, MD  Note: This dictation was prepared with Dragon dictation along with smaller phrase technology. Any transcriptional errors that result from this process are unintentional.

## 2015-11-07 NOTE — Progress Notes (Signed)
PHARMACY - PHYSICIAN COMMUNICATION CRITICAL VALUE ALERT - BLOOD CULTURE IDENTIFICATION (BCID)  Results for orders placed or performed during the hospital encounter of 18-Nov-2015  Blood Culture ID Panel (Reflexed) (Collected: 11/06/2015  8:28 PM)  Result Value Ref Range   Enterococcus species NOT DETECTED NOT DETECTED   Vancomycin resistance NOT DETECTED NOT DETECTED   Listeria monocytogenes NOT DETECTED NOT DETECTED   Staphylococcus species DETECTED (A) NOT DETECTED   Staphylococcus aureus DETECTED (A) NOT DETECTED   Methicillin resistance DETECTED (A) NOT DETECTED   Streptococcus species NOT DETECTED NOT DETECTED   Streptococcus agalactiae NOT DETECTED NOT DETECTED   Streptococcus pneumoniae NOT DETECTED NOT DETECTED   Streptococcus pyogenes NOT DETECTED NOT DETECTED   Acinetobacter baumannii NOT DETECTED NOT DETECTED   Enterobacteriaceae species NOT DETECTED NOT DETECTED   Enterobacter cloacae complex NOT DETECTED NOT DETECTED   Escherichia coli NOT DETECTED NOT DETECTED   Klebsiella oxytoca NOT DETECTED NOT DETECTED   Klebsiella pneumoniae NOT DETECTED NOT DETECTED   Proteus species NOT DETECTED NOT DETECTED   Serratia marcescens NOT DETECTED NOT DETECTED   Carbapenem resistance NOT DETECTED NOT DETECTED   Haemophilus influenzae NOT DETECTED NOT DETECTED   Neisseria meningitidis NOT DETECTED NOT DETECTED   Pseudomonas aeruginosa NOT DETECTED NOT DETECTED   Candida albicans NOT DETECTED NOT DETECTED   Candida glabrata NOT DETECTED NOT DETECTED   Candida krusei NOT DETECTED NOT DETECTED   Candida parapsilosis NOT DETECTED NOT DETECTED   Candida tropicalis NOT DETECTED NOT DETECTED    Name of physician (or Provider) Contacted: Bincy V, Nurse Practioner  Changes to prescribed antibiotics required: None required. Patient currently on Vancomycin.   Donaldo,Bri Wakeman D 11/07/2015  1:58 PM

## 2015-11-07 NOTE — Progress Notes (Signed)
PULMONARY / CRITICAL CARE MEDICINE   Name: Matthew Foley MRN: [id number] DOB: 03-10-1933    ADMISSION DATE:  11/11/2015 CONSULTATION DATE:  11/06/15  REFERRING MD:  Hospitalist  PT PROFILE: 29 M admitted 05/15 from rehab facility with AMS, AKI, hyperkalemia, AFRVR. A Foley catheter was placed 05/16 with 2300 cc urine immediately drained and significant improvement in renal function. In the evening of 05/17, he developed fever, tachycardia, hypotension and is noted to have a foul smelling pressure ulcer on his sacrum  MAJOR EVENTS/TEST RESULTS: 05/15 admitted as above 05/16 Cardiology and Renal consultations. Foley catheter placed - 2300 cc urine 05/16 Renal US: no hydronephrosis 05/16 CT head: NAD 05/16 TTE: The right ventricular systolic pressure was increased consistent with moderate pulmonary hypertension. Four-chamber dilated patient with severe left ventricle systolic dysfunction with left ventricular ejection fraction 25-30% and severe aortic stenosis and moderate mitral regurgitation 05/17 Developed septic shock - thought due to infected pressure ulcer. PCCM consultation. Vasopressors initiated 05/18 Improved responsiveness, weaning vasopressors, blood cultures positive for MRSA  INDWELLING DEVICES::   MICRO DATA: MRSA PCR 05/16 >> NEG Urine 05/16 >> staph aureus Blood 05/17 >> MRSA  PCT 05/17: 4.93, 16.28,    ANTIMICROBIALS:  Vanc 05/17 >>  Ceftaz 05/17 >>    SUBJECTIVE:  More responsive, + F/C. Rattling cough. Mildly increased WOB  VITAL SIGNS: BP 103/77 mmHg  Pulse 95  Temp(Src) 98.8 F (37.1 C) (Rectal)  Resp 28  Ht 6' (1.829 m)  Wt 81.647 kg (180 lb)  BMI 24.41 kg/m2  SpO2 100%  HEMODYNAMICS:    VENTILATOR SETTINGS:    INTAKE / OUTPUT: I/O last 3 completed shifts: In: 6817.4 [I.V.:5117.4; IV Piggyback:1700] Out: 3500 [Urine:3500]  PHYSICAL EXAMINATION: General: Frail appearing, no respiratory distress, RASS -4 Neuro: PERRL, EOMI,  MAEs, DTRs symmetric HEENT: NCAT Cardiovascular: IRIR, + S3, + systolic M Lungs: diffuse rhonchi Abdomen: scaphoid, soft, diminished BS, NT Ext: chronic stasis changes, no LE edema Skin: several pressure ulcers on backside, the one over sacrum is very malodorous  LABS:  BMET  Recent Labs Lab 11/05/15 2317 11/06/15 0748 11/06/15 1449 11/06/15 2042 11/07/15 0321  NA 146* 147*  --   --  144  K 2.6* 2.6* 3.2* 3.3* 4.4  CL 119* 118*  --   --  120*  CO2 18* 19*  --   --  18*  BUN 77* 59*  --   --  44*  CREATININE 3.33* 2.44*  --   --  1.52*  GLUCOSE 122* 119*  --   --  146*    Electrolytes  Recent Labs Lab 11/05/15 2206 11/05/15 2317 11/06/15 0748 11/07/15 0321  CALCIUM  --  7.7* 7.5* 7.1*  MG 1.5*  --  1.9 1.6*  PHOS  --   --  2.9 2.1*    CBC  Recent Labs Lab 11/05/15 0350 11/06/15 0300 11/07/15 0321  WBC 20.4* 20.2* 25.7*  HGB 10.9* 11.2* 10.6*  HCT 32.4* 32.9* 31.7*  PLT 135* 141* 91*    Coag's  Recent Labs Lab 11/19/2015 2320  APTT 35  INR 1.38    Sepsis Markers  Recent Labs Lab 11/01/2015 2320 11/05/15 0336 11/06/15 2028 11/07/15 0321  LATICACIDVEN 1.4 1.2  --   --   PROCALCITON  --   --  4.93 16.28    ABG No results for input(s): PHART, PCO2ART, PO2ART in the last 168 hours.  Liver Enzymes  Recent Labs Lab 11/03/2015 2320  AST 28  ALT 31  ALKPHOS 69  BILITOT 0.8  ALBUMIN 2.5*    Cardiac Enzymes  Recent Labs Lab 11/05/15 0350 11/05/15 1029 11/05/15 1534  TROPONINI 0.62* 3.16* 2.82*    Glucose  Recent Labs Lab 11/05/15 0321  GLUCAP 142*    CXR: no new film    ASSESSMENT / PLAN:  PULMONARY A: Mild acute hypoxic resp failure Possible PNA P:   Continue supplemental O2 CXR AM 05/19  CARDIOVASCULAR A:  Septic shock Severe cardiomyopathy (LVEF 25%) Aortic stenosis, mitral regurgitation AF with RVR, improved P:  Cont phenylephrine to maintain MAP > 65 mmHg Continue amiodarone Cont low dose metoprolol  to maintain HR < 115/min  RENAL A:   Obstructive uropathy, resolved after Foley catheterization Bladder outlet obstruction AKI, nonoliguric - Cr improving Hyperkalemia, resolved Hypokalemia. resolved Hypernatremia, resolved P:   Monitor BMET intermittently Monitor I/Os Correct electrolytes as indicated Renal service following and managing IVFs  GASTROINTESTINAL A:   Dysphagia due to AMS P:   SUP: IV PPI  Cont NPO until cognition improves  HEMATOLOGIC A:   Mild thrombocytopenia P:  DVT px: heparin gtt stopped 05/17 DVT px: SCDs Monitor CBC intermittently Transfuse per usual guidelines  INFECTIOUS A:   Severe sepsis Suspected pressure ulcer infection MRSA bacteremia P:   Monitor temp, WBC count Micro and abx as above  ENDOCRINE A:   Hypothyroidism P:   Cont L-thyroxine as IV until able to take POs  NEUROLOGIC A:   Acute enephalopathy - TME and septic P:   RASS goal: 0 Minimize sedating agents   FAMILY: I will try to meet with daughter today to discuss goal of care  CCM time: 40 mins The above time includes time spent in consultation with patient and/or family members and reviewing care plan on multidisciplinary rounds  Billy Fischer, MD PCCM service Mobile [phone] Pager [phone]  11/07/2015, 11:39 AM

## 2015-11-07 NOTE — Progress Notes (Signed)
Talked to Daughters regarding the code status.  Daughters have made the decision that in case of a cardiac arrest , patient would not like to get CPR, intubated and put on the life support device.   Bincy Varughese,AG-ACNP Pulmonary & Critical Care

## 2015-11-07 NOTE — Consult Note (Addendum)
Matthew Foley Infectious Disease     Reason for Consult:MRSA bacteremia   Referring Physician:  Date of Admission:  11/06/2015   Principal Problem:   Wide-complex tachycardia ([place]) Active Problems:   Altered mental status   Acute renal failure (ARF) (HCC)   Ventricular tachyarrhythmia (HCC)   HPI: Matthew Foley is a 82 y.o. male admitted 5.15 from a facility with nausea, vomiting, confusion and was found to be in ARF and with a WC tachycardia. Required DC cardioversion.  On admit wbc was 20 and had low grade temps, spiked to 103 5/17. BCX + MRSA, UCX with same. Had to have foley placed 5/16  with 2300 cc urine so some obstruction. Also with a foul smelling decub ulcer.   Abx 5/17 vanco ceftazidime  Past Medical History  Diagnosis Date  . Depression     h/o psychiatric hospitalisations prior  . Hypertension   . Hyperlipidemia   . CAD (coronary artery disease)     at least 1 stent  . Hx of pancreatitis   . Rheumatoid arthritis ([place])   . Hypothyroidism   . GERD (gastroesophageal reflux disease)   . Atrial fibrillation [facility])    Past Surgical History  Procedure Laterality Date  . Knee replacement Bilateral   . Cholecystectomy    . Hernia repair    . Toe amputation      Right, 2nd toe  . Joint replacement     Social History  Substance Use Topics  . Smoking status: Former Research scientist (life sciences)  . Smokeless tobacco: Former Systems developer     Comment: smokes cigars  . Alcohol Use: No     Comment: Margarita some times   Family History  Problem Relation Age of Onset  . Hypertension Mother   . Hypertension Father   . CAD Father     Allergies: No Known Allergies  Current antibiotics: Antibiotics Given (last 72 hours)    Date/Time Action Medication Dose Rate   11/06/15 2008 Given   vancomycin (VANCOCIN) 1,250 mg in sodium chloride 0.9 % 250 mL IVPB 1,250 mg 166.7 mL/hr   11/06/15 2018 Given   cefTAZidime (FORTAZ) 2 g in dextrose 5 % 50 mL IVPB 2 g 100 mL/hr   11/07/15 0603 Given    vancomycin (VANCOCIN) IVPB 1000 mg/200 mL premix 1,000 mg 200 mL/hr      MEDICATIONS: . aspirin EC  81 mg Oral Daily  . atorvastatin  40 mg Oral q1800  . cefTAZidime (FORTAZ)  IV  2 g Intravenous Q12H  . heparin subcutaneous  5,000 Units Subcutaneous Q8H  . hydrocortisone sodium succinate  50 mg Intravenous Q6H  . levothyroxine  50 mcg Intravenous Daily  . mupirocin ointment   Nasal BID  . pantoprazole (PROTONIX) IV  40 mg Intravenous Q24H  . sodium chloride flush  3 mL Intravenous Q12H  . sodium hypochlorite   Irrigation Daily  . sodium phosphate  Dextrose 5% IVPB  20 mmol Intravenous Once  . [START ON 11/08/2015] vancomycin  1,000 mg Intravenous Q18H    Review of Systems - unable to obtain   OBJECTIVE: Temp:  [98.4 F (36.9 C)-102.7 F (39.3 C)] 98.8 F (37.1 C) (05/18 0900) Pulse Rate:  [33-118] 95 (05/18 0900) Resp:  [20-33] 28 (05/18 0900) BP: (69-128)/(42-90) 103/77 mmHg (05/18 0900) SpO2:  [77 %-100 %] 100 % (05/18 0900) Physical Exam  Constitutional: frail, thin critically ill appearing HENT: PERRLA anicteric Mouth/Throat: Oropharynx is clear and dry. No oropharyngeal exudate.  Cardiovascular:Tachy No murmur heard.  Pulmonary/Chest: Effort normal and breath sounds normal. No respiratory distress. He has no wheezes.  Abdominal: Soft. Bowel sounds are normal. He exhibits no distension. There is no tenderness.  Lymphadenopathy: He has no cervical adenopathy.  Neurological: lethargic Skin: R shoulder decub with dry eschar, sacral ulcers  Psychiatric: lethargic  LABS: Results for orders placed or performed during the hospital encounter of 11/13/2015 (from the past 48 hour(s))  Heparin level (unfractionated)     Status: Abnormal   Collection Time: 11/05/15  1:31 PM  Result Value Ref Range   Heparin Unfractionated 0.11 (L) 0.30 - 0.70 IU/mL    Comment:        IF HEPARIN RESULTS ARE BELOW EXPECTED VALUES, AND PATIENT DOSAGE HAS BEEN CONFIRMED, SUGGEST FOLLOW UP  TESTING OF ANTITHROMBIN III LEVELS.   Basic metabolic panel     Status: Abnormal   Collection Time: 11/05/15  1:31 PM  Result Value Ref Range   Sodium 138 135 - 145 mmol/L   Potassium 3.9 3.5 - 5.1 mmol/L   Chloride 110 101 - 111 mmol/L   CO2 15 (L) 22 - 32 mmol/L   Glucose, Bld 145 (H) 65 - 99 mg/dL   BUN 114 (H) 6 - 20 mg/dL    Comment: RESULTS CONFIRMED BY MANUAL DILUTION   Creatinine, Ser 6.11 (H) 0.61 - 1.24 mg/dL   Calcium 8.0 (L) 8.9 - 10.3 mg/dL   GFR calc non Af Amer 8 (L) >60 mL/min   GFR calc Af Amer 9 (L) >60 mL/min    Comment: (NOTE) The eGFR has been calculated using the CKD EPI equation. This calculation has not been validated in all clinical situations. eGFR's persistently <60 mL/min signify possible Chronic Kidney Disease.    Anion gap 13 5 - 15  T4, free     Status: Abnormal   Collection Time: 11/05/15  1:31 PM  Result Value Ref Range   Free T4 0.59 (L) 0.61 - 1.12 ng/dL  T3, free     Status: Abnormal   Collection Time: 11/05/15  1:31 PM  Result Value Ref Range   T3, Free 0.8 (L) 2.0 - 4.4 pg/mL    Comment: (NOTE) Performed At: [facility] [place], [place] [id number] Lindon Romp MD [id number]   Vitamin B12     Status: None   Collection Time: 11/05/15  1:31 PM  Result Value Ref Range   Vitamin B-12 417 180 - 914 pg/mL    Comment: (NOTE) This assay is not validated for testing neonatal or myeloproliferative syndrome specimens for Vitamin B12 levels. Performed at [facility]   RPR     Status: None   Collection Time: 11/05/15  1:31 PM  Result Value Ref Range   RPR Ser Ql Non Reactive Non Reactive    Comment: (NOTE) Performed At: [facility] [place], [place] [id number] Lindon Romp MD JS:[id number]   Lipid panel     Status: Abnormal   Collection Time: 11/05/15  1:31 PM  Result Value Ref Range   Cholesterol 95 0 - 200 mg/dL   Triglycerides 92 <150 mg/dL   HDL 31 (L) >40 mg/dL    Total CHOL/HDL Ratio 3.1 RATIO   VLDL 18 0 - 40 mg/dL   LDL Cholesterol 46 0 - 99 mg/dL    Comment:        Total Cholesterol/HDL:CHD Risk Coronary Heart Disease Risk Table  Men   Women  1/2 Average Risk   3.4   3.3  Average Risk       5.0   4.4  2 X Average Risk   9.6   7.1  3 X Average Risk  23.4   11.0        Use the calculated Patient Ratio above and the CHD Risk Table to determine the patient's CHD Risk.        ATP III CLASSIFICATION (LDL):  <100     mg/dL   Optimal  100-129  mg/dL   Near or Above                    Optimal  130-159  mg/dL   Borderline  160-189  mg/dL   High  >190     mg/dL   Very High   Troponin I     Status: Abnormal   Collection Time: 11/05/15  3:34 PM  Result Value Ref Range   Troponin I 2.82 (H) <0.031 ng/mL    Comment: PREVIOUS RESULT CALLED AT 0021 11/05/2015 BY TLB.  TFK        POSSIBLE MYOCARDIAL ISCHEMIA. SERIAL TESTING RECOMMENDED.   Magnesium     Status: Abnormal   Collection Time: 11/05/15 10:06 PM  Result Value Ref Range   Magnesium 1.5 (L) 1.7 - 2.4 mg/dL  Basic metabolic panel     Status: Abnormal   Collection Time: 11/05/15 11:17 PM  Result Value Ref Range   Sodium 146 (H) 135 - 145 mmol/L    Comment: RESULTS VERIFIED BY REPEAT TESTING   Potassium 2.6 (LL) 3.5 - 5.1 mmol/L   Chloride 119 (H) 101 - 111 mmol/L   CO2 18 (L) 22 - 32 mmol/L   Glucose, Bld 122 (H) 65 - 99 mg/dL   BUN 77 (H) 6 - 20 mg/dL   Creatinine, Ser 3.33 (H) 0.61 - 1.24 mg/dL   Calcium 7.7 (L) 8.9 - 10.3 mg/dL   GFR calc non Af Amer 16 (L) >60 mL/min   GFR calc Af Amer 19 (L) >60 mL/min    Comment: (NOTE) The eGFR has been calculated using the CKD EPI equation. This calculation has not been validated in all clinical situations. eGFR's persistently <60 mL/min signify possible Chronic Kidney Disease.    Anion gap 9 5 - 15  Heparin level (unfractionated)     Status: None   Collection Time: 11/05/15 11:17 PM  Result Value Ref Range    Heparin Unfractionated 0.46 0.30 - 0.70 IU/mL    Comment:        IF HEPARIN RESULTS ARE BELOW EXPECTED VALUES, AND PATIENT DOSAGE HAS BEEN CONFIRMED, SUGGEST FOLLOW UP TESTING OF ANTITHROMBIN III LEVELS.   CBC     Status: Abnormal   Collection Time: 11/06/15  3:00 AM  Result Value Ref Range   WBC 20.2 (H) 3.8 - 10.6 K/uL   RBC 3.51 (L) 4.40 - 5.90 MIL/uL   Hemoglobin 11.2 (L) 13.0 - 18.0 g/dL   HCT 32.9 (L) 40.0 - 52.0 %   MCV 93.8 80.0 - 100.0 fL   MCH 31.8 26.0 - 34.0 pg   MCHC 33.9 32.0 - 36.0 g/dL   RDW 14.6 (H) 11.5 - 14.5 %   Platelets 141 (L) 150 - 440 K/uL  Heparin level (unfractionated)     Status: None   Collection Time: 11/06/15  7:48 AM  Result Value Ref Range   Heparin Unfractionated 0.51 0.30 - 0.70 IU/mL  Comment:        IF HEPARIN RESULTS ARE BELOW EXPECTED VALUES, AND PATIENT DOSAGE HAS BEEN CONFIRMED, SUGGEST FOLLOW UP TESTING OF ANTITHROMBIN III LEVELS.   Basic metabolic panel     Status: Abnormal   Collection Time: 11/06/15  7:48 AM  Result Value Ref Range   Sodium 147 (H) 135 - 145 mmol/L   Potassium 2.6 (LL) 3.5 - 5.1 mmol/L    Comment: CRITICAL RESULT CALLED TO, READ BACK BY AND VERIFIED WITH JEREMIAH KEENE AT 5009 11/06/15 DAS    Chloride 118 (H) 101 - 111 mmol/L   CO2 19 (L) 22 - 32 mmol/L   Glucose, Bld 119 (H) 65 - 99 mg/dL   BUN 59 (H) 6 - 20 mg/dL   Creatinine, Ser 2.44 (H) 0.61 - 1.24 mg/dL   Calcium 7.5 (L) 8.9 - 10.3 mg/dL   GFR calc non Af Amer 23 (L) >60 mL/min   GFR calc Af Amer 27 (L) >60 mL/min    Comment: (NOTE) The eGFR has been calculated using the CKD EPI equation. This calculation has not been validated in all clinical situations. eGFR's persistently <60 mL/min signify possible Chronic Kidney Disease.    Anion gap 10 5 - 15  Phosphorus     Status: None   Collection Time: 11/06/15  7:48 AM  Result Value Ref Range   Phosphorus 2.9 2.5 - 4.6 mg/dL  Magnesium     Status: None   Collection Time: 11/06/15  7:48 AM   Result Value Ref Range   Magnesium 1.9 1.7 - 2.4 mg/dL  Potassium     Status: Abnormal   Collection Time: 11/06/15  2:49 PM  Result Value Ref Range   Potassium 3.2 (L) 3.5 - 5.1 mmol/L  CULTURE, BLOOD (ROUTINE X 2) w Reflex to ID Panel     Status: Abnormal (Preliminary result)   Collection Time: 11/06/15  8:28 PM  Result Value Ref Range   Specimen Description BLOOD LEFT HAND    Special Requests BOTTLES DRAWN AEROBIC AND ANAEROBIC [id number]    Culture  Setup Time      GRAM POSITIVE COCCI AEROBIC BOTTLE ONLY CRITICAL RESULT CALLED TO, READ BACK BY AND VERIFIED WITH: CHRISTINE KATSOUDAS 11/07/15 1115 MLM Organism ID to follow    Culture STAPHYLOCOCCUS AUREUS AEROBIC BOTTLE ONLY (A)    Report Status PENDING   Procalcitonin - Baseline     Status: None   Collection Time: 11/06/15  8:28 PM  Result Value Ref Range   Procalcitonin 4.93 ng/mL    Comment:        Interpretation: PCT > 2 ng/mL: Systemic infection (sepsis) is likely, unless other causes are known. (NOTE)         ICU PCT Algorithm               Non ICU PCT Algorithm    ----------------------------     ------------------------------         PCT < 0.25 ng/mL                 PCT < 0.1 ng/mL     Stopping of antibiotics            Stopping of antibiotics       strongly encouraged.               strongly encouraged.    ----------------------------     ------------------------------       PCT level decrease by  PCT < 0.25 ng/mL       >= 80% from peak PCT       OR PCT 0.25 - 0.5 ng/mL          Stopping of antibiotics                                             encouraged.     Stopping of antibiotics           encouraged.    ----------------------------     ------------------------------       PCT level decrease by              PCT >= 0.25 ng/mL       < 80% from peak PCT        AND PCT >= 0.5 ng/mL            Continuing antibiotics                                               encouraged.       Continuing  antibiotics            encouraged.    ----------------------------     ------------------------------     PCT level increase compared          PCT > 0.5 ng/mL         with peak PCT AND          PCT >= 0.5 ng/mL             Escalation of antibiotics                                          strongly encouraged.      Escalation of antibiotics        strongly encouraged.   Blood Culture ID Panel (Reflexed)     Status: Abnormal   Collection Time: 11/06/15  8:28 PM  Result Value Ref Range   Enterococcus species NOT DETECTED NOT DETECTED   Vancomycin resistance NOT DETECTED NOT DETECTED   Listeria monocytogenes NOT DETECTED NOT DETECTED   Staphylococcus species DETECTED (A) NOT DETECTED    Comment: CRITICAL RESULT CALLED TO, READ BACK BY AND VERIFIED WITH: CHRISTINE KATSOUDAS 11/07/15 1115 MLM    Staphylococcus aureus DETECTED (A) NOT DETECTED    Comment: CHRISTINE KATSOUDAS 11/07/15 1115 MLM   Methicillin resistance DETECTED (A) NOT DETECTED    Comment: CRITICAL RESULT CALLED TO, READ BACK BY AND VERIFIED WITH: CHRISTINE KATSOUDAS 11/07/15 1115 MLM    Streptococcus species NOT DETECTED NOT DETECTED   Streptococcus agalactiae NOT DETECTED NOT DETECTED   Streptococcus pneumoniae NOT DETECTED NOT DETECTED   Streptococcus pyogenes NOT DETECTED NOT DETECTED   Acinetobacter baumannii NOT DETECTED NOT DETECTED   Enterobacteriaceae species NOT DETECTED NOT DETECTED   Enterobacter cloacae complex NOT DETECTED NOT DETECTED   Escherichia coli NOT DETECTED NOT DETECTED   Klebsiella oxytoca NOT DETECTED NOT DETECTED   Klebsiella pneumoniae NOT DETECTED NOT DETECTED   Proteus species NOT DETECTED NOT DETECTED   Serratia marcescens NOT DETECTED NOT DETECTED   Carbapenem resistance NOT DETECTED NOT DETECTED  Haemophilus influenzae NOT DETECTED NOT DETECTED   Neisseria meningitidis NOT DETECTED NOT DETECTED   Pseudomonas aeruginosa NOT DETECTED NOT DETECTED   Candida albicans NOT DETECTED NOT  DETECTED   Candida glabrata NOT DETECTED NOT DETECTED   Candida krusei NOT DETECTED NOT DETECTED   Candida parapsilosis NOT DETECTED NOT DETECTED   Candida tropicalis NOT DETECTED NOT DETECTED  Potassium     Status: Abnormal   Collection Time: 11/06/15  8:42 PM  Result Value Ref Range   Potassium 3.3 (L) 3.5 - 5.1 mmol/L  Basic metabolic panel     Status: Abnormal   Collection Time: 11/07/15  3:21 AM  Result Value Ref Range   Sodium 144 135 - 145 mmol/L   Potassium 4.4 3.5 - 5.1 mmol/L   Chloride 120 (H) 101 - 111 mmol/L   CO2 18 (L) 22 - 32 mmol/L   Glucose, Bld 146 (H) 65 - 99 mg/dL   BUN 44 (H) 6 - 20 mg/dL   Creatinine, Ser 1.52 (H) 0.61 - 1.24 mg/dL   Calcium 7.1 (L) 8.9 - 10.3 mg/dL   GFR calc non Af Amer 42 (L) >60 mL/min   GFR calc Af Amer 48 (L) >60 mL/min    Comment: (NOTE) The eGFR has been calculated using the CKD EPI equation. This calculation has not been validated in all clinical situations. eGFR's persistently <60 mL/min signify possible Chronic Kidney Disease.    Anion gap 6 5 - 15  Magnesium     Status: Abnormal   Collection Time: 11/07/15  3:21 AM  Result Value Ref Range   Magnesium 1.6 (L) 1.7 - 2.4 mg/dL  Phosphorus     Status: Abnormal   Collection Time: 11/07/15  3:21 AM  Result Value Ref Range   Phosphorus 2.1 (L) 2.5 - 4.6 mg/dL  Heparin level (unfractionated)     Status: Abnormal   Collection Time: 11/07/15  3:21 AM  Result Value Ref Range   Heparin Unfractionated <0.10 (L) 0.30 - 0.70 IU/mL    Comment:        IF HEPARIN RESULTS ARE BELOW EXPECTED VALUES, AND PATIENT DOSAGE HAS BEEN CONFIRMED, SUGGEST FOLLOW UP TESTING OF ANTITHROMBIN III LEVELS.   CBC     Status: Abnormal   Collection Time: 11/07/15  3:21 AM  Result Value Ref Range   WBC 25.7 (H) 3.8 - 10.6 K/uL   RBC 3.36 (L) 4.40 - 5.90 MIL/uL   Hemoglobin 10.6 (L) 13.0 - 18.0 g/dL   HCT 31.7 (L) 40.0 - 52.0 %   MCV 94.5 80.0 - 100.0 fL   MCH 31.6 26.0 - 34.0 pg   MCHC 33.4 32.0 -  36.0 g/dL   RDW 14.9 (H) 11.5 - 14.5 %   Platelets 91 (L) 150 - 440 K/uL  Procalcitonin     Status: None   Collection Time: 11/07/15  3:21 AM  Result Value Ref Range   Procalcitonin 16.28 ng/mL    Comment:        Interpretation: PCT >= 10 ng/mL: Important systemic inflammatory response, almost exclusively due to severe bacterial sepsis or septic shock. (NOTE)         ICU PCT Algorithm               Non ICU PCT Algorithm    ----------------------------     ------------------------------         PCT < 0.25 ng/mL  PCT < 0.1 ng/mL     Stopping of antibiotics            Stopping of antibiotics       strongly encouraged.               strongly encouraged.    ----------------------------     ------------------------------       PCT level decrease by               PCT < 0.25 ng/mL       >= 80% from peak PCT       OR PCT 0.25 - 0.5 ng/mL          Stopping of antibiotics                                             encouraged.     Stopping of antibiotics           encouraged.    ----------------------------     ------------------------------       PCT level decrease by              PCT >= 0.25 ng/mL       < 80% from peak PCT        AND PCT >= 0.5 ng/mL             Continuing antibiotics                                              encouraged.       Continuing antibiotics            encouraged.    ----------------------------     ------------------------------     PCT level increase compared          PCT > 0.5 ng/mL         with peak PCT AND          PCT >= 0.5 ng/mL             Escalation of antibiotics                                          strongly encouraged.      Escalation of antibiotics        strongly encouraged.    No components found for: ESR, C REACTIVE PROTEIN MICRO: Recent Results (from the past 720 hour(s))  MRSA PCR Screening     Status: Abnormal   Collection Time: 11/05/15  3:48 AM  Result Value Ref Range Status   MRSA by PCR POSITIVE (A) NEGATIVE  Final    Comment:        The GeneXpert MRSA Assay (FDA approved for NASAL specimens only), is one component of a comprehensive MRSA colonization surveillance program. It is not intended to diagnose MRSA infection nor to guide or monitor treatment for MRSA infections. CALLED TO EMMA EGWUATU @ 5956 ON 11/05/2015 BY CAF   Urine culture     Status: Abnormal   Collection Time: 11/05/15 10:00 AM  Result Value Ref Range Status   Specimen Description URINE, CATHETERIZED  Final   Special Requests NONE  Final   Culture (A)  Final    >=100,000 COLONIES/mL METHICILLIN RESISTANT STAPHYLOCOCCUS AUREUS   Report Status 11/07/2015 FINAL  Final   Organism ID, Bacteria METHICILLIN RESISTANT STAPHYLOCOCCUS AUREUS (A)  Final      Susceptibility   Methicillin resistant staphylococcus aureus - MIC*    CIPROFLOXACIN >=8 RESISTANT Resistant     GENTAMICIN <=0.5 SENSITIVE Sensitive     NITROFURANTOIN <=16 SENSITIVE Sensitive     OXACILLIN >=4 RESISTANT Resistant     TETRACYCLINE <=1 SENSITIVE Sensitive     VANCOMYCIN 1 SENSITIVE Sensitive     TRIMETH/SULFA <=10 SENSITIVE Sensitive     CLINDAMYCIN <=0.25 SENSITIVE Sensitive     RIFAMPIN <=0.5 SENSITIVE Sensitive     Inducible Clindamycin NEGATIVE Sensitive     * >=100,000 COLONIES/mL METHICILLIN RESISTANT STAPHYLOCOCCUS AUREUS  CULTURE, BLOOD (ROUTINE X 2) w Reflex to ID Panel     Status: Abnormal (Preliminary result)   Collection Time: 11/06/15  8:28 PM  Result Value Ref Range Status   Specimen Description BLOOD LEFT HAND  Final   Special Requests BOTTLES DRAWN AEROBIC AND ANAEROBIC [place]  Final   Culture  Setup Time   Final    GRAM POSITIVE COCCI AEROBIC BOTTLE ONLY CRITICAL RESULT CALLED TO, READ BACK BY AND VERIFIED WITH: CHRISTINE KATSOUDAS 11/07/15 1115 MLM Organism ID to follow    Culture STAPHYLOCOCCUS AUREUS AEROBIC BOTTLE ONLY (A)  Final   Report Status PENDING  Incomplete  Blood Culture ID Panel (Reflexed)     Status: Abnormal    Collection Time: 11/06/15  8:28 PM  Result Value Ref Range Status   Enterococcus species NOT DETECTED NOT DETECTED Final   Vancomycin resistance NOT DETECTED NOT DETECTED Final   Listeria monocytogenes NOT DETECTED NOT DETECTED Final   Staphylococcus species DETECTED (A) NOT DETECTED Final    Comment: CRITICAL RESULT CALLED TO, READ BACK BY AND VERIFIED WITH: CHRISTINE KATSOUDAS 11/07/15 1115 MLM    Staphylococcus aureus DETECTED (A) NOT DETECTED Final    Comment: CHRISTINE KATSOUDAS 11/07/15 1115 MLM   Methicillin resistance DETECTED (A) NOT DETECTED Final    Comment: CRITICAL RESULT CALLED TO, READ BACK BY AND VERIFIED WITH: CHRISTINE KATSOUDAS 11/07/15 1115 MLM    Streptococcus species NOT DETECTED NOT DETECTED Final   Streptococcus agalactiae NOT DETECTED NOT DETECTED Final   Streptococcus pneumoniae NOT DETECTED NOT DETECTED Final   Streptococcus pyogenes NOT DETECTED NOT DETECTED Final   Acinetobacter baumannii NOT DETECTED NOT DETECTED Final   Enterobacteriaceae species NOT DETECTED NOT DETECTED Final   Enterobacter cloacae complex NOT DETECTED NOT DETECTED Final   Escherichia coli NOT DETECTED NOT DETECTED Final   Klebsiella oxytoca NOT DETECTED NOT DETECTED Final   Klebsiella pneumoniae NOT DETECTED NOT DETECTED Final   Proteus species NOT DETECTED NOT DETECTED Final   Serratia marcescens NOT DETECTED NOT DETECTED Final   Carbapenem resistance NOT DETECTED NOT DETECTED Final   Haemophilus influenzae NOT DETECTED NOT DETECTED Final   Neisseria meningitidis NOT DETECTED NOT DETECTED Final   Pseudomonas aeruginosa NOT DETECTED NOT DETECTED Final   Candida albicans NOT DETECTED NOT DETECTED Final   Candida glabrata NOT DETECTED NOT DETECTED Final   Candida krusei NOT DETECTED NOT DETECTED Final   Candida parapsilosis NOT DETECTED NOT DETECTED Final   Candida tropicalis NOT DETECTED NOT DETECTED Final    IMAGING:  Echo 5/16  Impressions:  - The right ventricular  systolic pressure was increased consistent  with moderate pulmonary hypertension. Four-chamber dilated  patient with  severe left ventricle systolic dysfunction with left  ventricular ejection fraction 25-30% and severe aortic stenosis  and moderate mitral regurgitation.  Dg Chest 1 View  10/28/2015  CLINICAL DATA:  Confusion/altered mental status EXAM: CHEST 1 VIEW COMPARISON:  August 29, 2015 chest radiograph and chest CT September 02, 2015 FINDINGS: There is no edema or consolidation. The heart size and pulmonary vascularity are normal. No adenopathy. No bone lesions. IMPRESSION: No edema or consolidation. Electronically Signed   By: Lowella Grip III M.D.   On: 10/28/2015 10:58   Dg Shoulder Right  10/28/2015  CLINICAL DATA:  Patient found on floor.  Right shoulder pain. EXAM: RIGHT SHOULDER - 2+ VIEW COMPARISON:  None. FINDINGS: The right shoulder is located. No acute bone or soft tissue abnormality. A remote [place] fracture is evident. The visualized right hemithorax is clear. The visualize right clavicle is within normal limits. IMPRESSION: No acute abnormality. Electronically Signed   By: San Morelle M.D.   On: 10/28/2015 11:32   Ct Head Wo Contrast  11/05/2015  CLINICAL DATA:  Confusion and recent cardioversion EXAM: CT HEAD WITHOUT CONTRAST TECHNIQUE: Contiguous axial images were obtained from the base of the skull through the vertex without intravenous contrast. COMPARISON:  10/28/2015 FINDINGS: Bony calvarium is intact. No gross soft tissue abnormality is seen. Mild atrophic changes are again identified. No findings to suggest acute hemorrhage, acute infarction or space-occupying mass lesion are noted. Fluid is again seen within the right mastoid air cells. IMPRESSION: No significant interval change from the prior exam. Electronically Signed   By: Inez Catalina M.D.   On: 11/05/2015 12:37   Ct Head Wo Contrast  10/28/2015  CLINICAL DATA:  Patient found down today. Altered mental  status. Initial encounter. EXAM: CT HEAD WITHOUT CONTRAST CT CERVICAL SPINE WITHOUT CONTRAST TECHNIQUE: Multidetector CT imaging of the head and cervical spine was performed following the standard protocol without intravenous contrast. Multiplanar CT image reconstructions of the cervical spine were also generated. COMPARISON:  Head CT scan 11/06/2013. FINDINGS: CT HEAD FINDINGS There is cortical atrophy and chronic microvascular ischemic change. No evidence of acute intracranial abnormality including hemorrhage, infarct, mass lesion, mass effect, midline shift or abnormal extra-axial fluid collection. Right mastoid effusion is seen. Left mastoid air cells and imaged paranasal sinuses are clear. The calvarium is intact. CT CERVICAL SPINE FINDINGS No cervical spine fracture is identified. 0.3 cm anterolisthesis C4 on C5 and trace anterolisthesis C5 on C6 is due to facet arthropathy. The C3-4 and C4-5 facet joints and disc interspaces are fused autologous leak. Lung apices are clear. IMPRESSION: No acute abnormality head or cervical spine. Right mastoid effusion. Multilevel cervical spondylosis. Electronically Signed   By: Inge Rise M.D.   On: 10/28/2015 11:33   Ct Cervical Spine Wo Contrast  10/28/2015  CLINICAL DATA:  Patient found down today. Altered mental status. Initial encounter. EXAM: CT HEAD WITHOUT CONTRAST CT CERVICAL SPINE WITHOUT CONTRAST TECHNIQUE: Multidetector CT imaging of the head and cervical spine was performed following the standard protocol without intravenous contrast. Multiplanar CT image reconstructions of the cervical spine were also generated. COMPARISON:  Head CT scan 11/06/2013. FINDINGS: CT HEAD FINDINGS There is cortical atrophy and chronic microvascular ischemic change. No evidence of acute intracranial abnormality including hemorrhage, infarct, mass lesion, mass effect, midline shift or abnormal extra-axial fluid collection. Right mastoid effusion is seen. Left mastoid air  cells and imaged paranasal sinuses are clear. The calvarium is intact. CT CERVICAL SPINE FINDINGS No cervical spine fracture is  identified. 0.3 cm anterolisthesis C4 on C5 and trace anterolisthesis C5 on C6 is due to facet arthropathy. The C3-4 and C4-5 facet joints and disc interspaces are fused autologous leak. Lung apices are clear. IMPRESSION: No acute abnormality head or cervical spine. Right mastoid effusion. Multilevel cervical spondylosis. Electronically Signed   By: Inge Rise M.D.   On: 10/28/2015 11:33   US Renal  11/05/2015  CLINICAL DATA:  New EXAM: RENAL / URINARY TRACT ULTRASOUND COMPLETE COMPARISON:  CT, 04/25/2014. FINDINGS: Right Kidney: Length: 11.3 cm. Borderline increased parenchymal echogenicity. Small echogenic foci likely vascular calcifications. No masses. No hydronephrosis. Left Kidney: Length: 9.5 cm. Normal parenchymal echogenicity. No masses or definitive stones. Small lesions seen in the prior CT are not resolved sonographically. No hydronephrosis. Bladder: Foley catheter in place, with bladder still mildly distended. There is wall thickening, most evident posteriorly. No discrete mass. IMPRESSION: 1. No acute findings.  No hydronephrosis. 2. Small left renal lesions seen on the prior CT are not evident sonographically. No renal masses. 3. Bladder wall thickening. This may be due to an infectious or inflammatory cystitis. It could be the result of chronic bladder outlet obstruction. No convincing bladder mass. Electronically Signed   By: Lajean Manes M.D.   On: 11/05/2015 13:14   Dg Chest Port 1 View  11/06/2015  CLINICAL DATA:  Atrial fibrillation and hypertension. EXAM: PORTABLE CHEST 1 VIEW COMPARISON:  Nov 04, 2015 FINDINGS: There is patchy infiltrate in the left base at the level of the left hemidiaphragm. The lungs elsewhere are clear. Heart is upper normal in size with pulmonary vascularity within normal limits. No adenopathy. No bone lesions. IMPRESSION: Patchy  infiltrate left base. Lungs elsewhere clear. Stable cardiac silhouette. Electronically Signed   By: Lowella Grip III M.D.   On: 11/06/2015 20:18   Dg Chest Portable 1 View  11/05/2015  CLINICAL DATA:  82 year old male with tachycardia EXAM: PORTABLE CHEST 1 VIEW COMPARISON:  Chest radiograph dated 10/28/2015 FINDINGS: Single portable view of the chest demonstrates emphysematous changes of the lungs. There is no focal consolidation, pleural effusion, or pneumothorax. The cardiac silhouette is within normal limits with no acute osseous pathology. IMPRESSION: No active disease. Electronically Signed   By: Anner Crete M.D.   On: 11/05/2015 00:30   Dg Foot Complete Left  10/28/2015  CLINICAL DATA:  Pain following fall EXAM: LEFT FOOT - COMPLETE 3+ VIEW COMPARISON:  None. FINDINGS: Frontal, oblique, and lateral views were obtained. There is pes planus. Bones are osteoporotic. No acute fracture or dislocation is evident. Flexion deformities are noted in the MTP and PIP joint regions. There is moderate narrowing of all PIP and DIP joints as well as the first MTP joint. Advanced arthropathy at the tarsal-metatarsal junction. No erosive change or bony destruction evident. IMPRESSION: Extensive arthropathy, most marked at the tarsal-metatarsal level. No fracture or dislocation. Pes planus. Electronically Signed   By: Lowella Grip III M.D.   On: 10/28/2015 11:30    Assessment:   Azul Brumett is a 82 y.o. male with Staph aurues bacteremia, likely from UTI with acute urinary retention. He was recently admitted 5/8-11 following a fall with confusion Rhabdo, AKD. It does not appear cultures were done at that time although did have wbc 17 at admit 5/8.  He was afebrile throughout that admission and his wbc went down to 10 with no antibiotics. Currently he is in poor condition, has a sacral decub, is on pressors. Some concern for PNA as well  Recommendations Continue vancomycin Continue ceftaz for  possible PNA Repeat bcx to document clearance He has had a TTE which is neg for vegetations but may need a TEE WIll need minimum 2 weeks of IV vanco for the MRSA bacteremia  Thank you very much for allowing me to participate in the care of this patient. Please call with questions.   Cheral Marker. Ola Spurr, MD

## 2015-11-07 NOTE — Progress Notes (Signed)
Chaplain rounded the unit and provided a compassionate presence and support for to the patient through silent prayer. Patient appeared to be sleeping. Chaplain Gordan Grell [phone] 

## 2015-11-07 NOTE — Progress Notes (Signed)
eLink Physician-Brief Progress Note Patient Name: Matthew Foley DOB: 1933-05-27 MRN: [id number]   Date of Service  11/07/2015  HPI/Events of Note  Mg=1.6  eICU Interventions  2 gm IV MAG ordered     Intervention Category Minor Interventions: Electrolytes abnormality - evaluation and management  Adelynne Joerger 11/07/2015, 6:06 AM

## 2015-11-07 NOTE — Progress Notes (Signed)
Initial Nutrition Assessment  DOCUMENTATION CODES:   Severe malnutrition in context of chronic illness  INTERVENTION:   Await diet advancement as medically able. Pt previously ordered Dysphagia II diet order with thin liquids; SLP following. Recommend Ensure Enlive po TID, each supplement provides 350 kcal and 20 grams of protein, once pt diet able to be advanced as pt like vanilla Ensure.   NUTRITION DIAGNOSIS:   Malnutrition related to chronic illness as evidenced by severe depletion of body fat, severe depletion of muscle mass.  GOAL:   Patient will meet greater than or equal to 90% of their needs  MONITOR:   PO intake, Diet advancement, Labs, Weight trends, I & O's, Skin  REASON FOR ASSESSMENT:   Rounds Wound healing  ASSESSMENT:   Pt admitted from [facility] with septic shock with pna, acute renal failure, NSTEMI with multiple unstageable pressure ulcers present. Pt with recent admission, seen by RD on 5/8, whereupon pt met criteria for severe malnutrition.  Past Medical History  Diagnosis Date  . Depression     h/o psychiatric hospitalisations prior  . Hypertension   . Hyperlipidemia   . CAD (coronary artery disease)     at least 1 stent  . Hx of pancreatitis   . Rheumatoid arthritis ( [place])   . Hypothyroidism   . GERD (gastroesophageal reflux disease)   . Atrial fibrillation ([place])     Diet Order:  Diet NPO time specified Except for: Ice Chips, Sips with Meds   Pt eating ice chips from family member on visit.   Daughter at bedside reports pt was 'not eating that great' at [facility] since last admission. Daughter reports after having GI surgeries 2 years ago appetite has gone down but felt that just recently prior to last admission pt was eating a little better sandwiches and salads. Daughter reports helping him to get his meals. Last admission on 5/8 pt had reported to RD that his appetite had gone down since his wife had passed. Daughter reports pt  like Vanilla Ensures and will drink them well usually. Daughter also reported that pt usually did not have any difficulty chewing or swallowing.  Medications: Protonix, 0.45%NS with KCl at 143m/hr, Amiodarone, Neo-synepherine  Electrolyte/Renal Profile and Glucose Profile:   Recent Labs Lab 11/05/15 2206 11/05/15 2317 11/06/15 0748 11/06/15 1449 11/06/15 2042 11/07/15 0321  NA  --  146* 147*  --   --  144  K  --  2.6* 2.6* 3.2* 3.3* 4.4  CL  --  119* 118*  --   --  120*  CO2  --  18* 19*  --   --  18*  BUN  --  77* 59*  --   --  44*  CREATININE  --  3.33* 2.44*  --   --  1.52*  CALCIUM  --  7.7* 7.5*  --   --  7.1*  MG 1.5*  --  1.9  --   --  1.6*  PHOS  --   --  2.9  --   --  2.1*  GLUCOSE  --  122* 119*  --   --  146*     Gastrointestinal Profile: Last BM:  no BM documented   Nutrition-Focused Physical Exam Findings: Nutrition-Focused physical exam completed. Findings are moderate-severe fat depletion, severe muscle depletion, and no documented edema.     Weight Change: Pt daughter reports pt weight of 170-something in March and felt pt was doing very well. Pt weight last admission  10days ago was 153lbs. Question accuracy of current weight.   Skin:   (Multiple Unstageable pressure ulcers)    Height:   Ht Readings from Last 1 Encounters:  10/31/2015 6' (1.829 m)    Weight:   Wt Readings from Last 1 Encounters:  10/23/2015 180 lb (81.647 kg)   Wt Readings from Last 10 Encounters:  10/26/2015 180 lb (81.647 kg)  10/28/15 153 lb 14.1 oz (69.8 kg)  08/29/15 168 lb (76.204 kg)  12/05/14 147 lb 14.9 oz (67.1 kg)    BMI:  Body mass index is 24.41 kg/(m^2).  Estimated Nutritional Needs:   Kcal:  2050-2470kcals  Protein:  98-123g protein  Fluid:  >/= 2L fluid  EDUCATION NEEDS:   No education needs identified at this time  Dwyane Luo, RD, LDN Pager [phone] Weekend/On-Call Pager [phone]

## 2015-11-07 NOTE — Progress Notes (Signed)
PARENTERAL NUTRITION CONSULT NOTE - FOLLOW UP   Pharmacy Consult for Electrolyte Monitoring Indication: Hypokalemia, Hypomagnesemia  No Known Allergies  Patient Measurements: Height: 6' (182.9 cm) Weight: 180 lb (81.647 kg) IBW/kg (Calculated) : 77.6  Vital Signs: Temp: 98.8 F (37.1 C) (05/18 0800) Temp Source: Rectal (05/18 0800) BP: 101/62 mmHg (05/18 0800) Pulse Rate: 84 (05/18 0800) Intake/Output from previous day: 05/17 0701 - 05/18 0700 In: 5938.5 [I.V.:4238.5; IV Piggyback:1700] Out: 1850 [Urine:1850] Intake/Output from this shift: Total I/O In: 970.1 [I.V.:970.1] Out: 200 [Urine:200]  Labs:  Recent Labs  2015-11-30 2320 11/05/15 0350 11/06/15 0300 11/07/15 0321  WBC 19.9* 20.4* 20.2* 25.7*  HGB 11.8* 10.9* 11.2* 10.6*  HCT 35.8* 32.4* 32.9* 31.7*  PLT 154 135* 141* 91*  APTT 35  --   --   --   INR 1.38  --   --   --      Recent Labs  November 30, 2015 2320  11/05/15 1331 11/05/15 2206 11/05/15 2317 11/06/15 0748 11/06/15 1449 11/06/15 2042 11/07/15 0321  NA 137  < > 138  --  146* 147*  --   --  144  K 6.3*  < > 3.9  --  2.6* 2.6* 3.2* 3.3* 4.4  CL 105  < > 110  --  119* 118*  --   --  120*  CO2 16*  < > 15*  --  18* 19*  --   --  18*  GLUCOSE 138*  < > 145*  --  122* 119*  --   --  146*  BUN 127*  < > 114*  --  77* 59*  --   --  44*  CREATININE 8.60*  < > 6.11*  --  3.33* 2.44*  --   --  1.52*  CALCIUM 8.7*  < > 8.0*  --  7.7* 7.5*  --   --  7.1*  MG  --   --   --  1.5*  --  1.9  --   --  1.6*  PHOS  --   --   --   --   --  2.9  --   --  2.1*  PROT 6.0*  --   --   --   --   --   --   --   --   ALBUMIN 2.5*  --   --   --   --   --   --   --   --   AST 28  --   --   --   --   --   --   --   --   ALT 31  --   --   --   --   --   --   --   --   ALKPHOS 69  --   --   --   --   --   --   --   --   BILITOT 0.8  --   --   --   --   --   --   --   --   TRIG  --   --  92  --   --   --   --   --   --   CHOLHDL  --   --  3.1  --   --   --   --   --   --    CHOL  --   --  95  --   --   --   --   --   --   < > =  values in this interval not displayed. Estimated Creatinine Clearance: 42.5 mL/min (by C-G formula based on Cr of 1.52).    Recent Labs  11/05/15 0321  GLUCAP 142*    Medical History: Past Medical History  Diagnosis Date  . Depression     h/o psychiatric hospitalisations prior  . Hypertension   . Hyperlipidemia   . CAD (coronary artery disease)     at least 1 stent  . Hx of pancreatitis   . Rheumatoid arthritis ([facility])   . Hypothyroidism   . GERD (gastroesophageal reflux disease)   . Atrial fibrillation ([facility])     Medications:  Scheduled:  . aspirin EC  81 mg Oral Daily  . atorvastatin  40 mg Oral q1800  . cefTAZidime (FORTAZ)  IV  2 g Intravenous Q24H  . hydrocortisone sodium succinate  50 mg Intravenous Q6H  . levothyroxine  50 mcg Intravenous Daily  . mupirocin ointment   Nasal BID  . pantoprazole (PROTONIX) IV  40 mg Intravenous Q24H  . sodium chloride flush  3 mL Intravenous Q12H  . sodium hypochlorite   Irrigation Daily  . sodium phosphate  Dextrose 5% IVPB  20 mmol Intravenous Once  . vancomycin  1,000 mg Intravenous Q24H   Infusions:  . 0.45 % NaCl with KCl 20 mEq / L 125 mL/hr at 11/07/15 0800  . amiodarone 30 mg/hr (11/07/15 0800)  . phenylephrine (NEO-SYNEPHRINE) Adult infusion 30 mcg/min (11/07/15 3710)    Assessment: 82 y/o M admitted with Vtach from hyperkalemia likely due to urinary obstruction now with hypokalemia, hypermagnesemia, and improving SCr.   All electrolytes within normal limits except Magnesium 1.6 and Phos: 2.1   Plan:  MD Kasa gave order for Magnesium 2 g IV x 1. Will also replace Phos with Na Phos 20 mmol x 1 IV. Will recheck electrolyte panel with am labs. Pharmacy to follow and manage.   Edmond,Ethin Drummond D 11/07/2015,8:55 AM

## 2015-11-07 NOTE — Progress Notes (Signed)
PHARMACY - PHYSICIAN COMMUNICATION CRITICAL VALUE ALERT - BLOOD CULTURE IDENTIFICATION (BCID)  Results for orders placed or performed during the hospital encounter of 2015/11/28  Blood Culture ID Panel (Reflexed) (Collected: 11/06/2015  8:28 PM)  Result Value Ref Range   Enterococcus species NOT DETECTED NOT DETECTED   Vancomycin resistance NOT DETECTED NOT DETECTED   Listeria monocytogenes NOT DETECTED NOT DETECTED   Staphylococcus species DETECTED (A) NOT DETECTED   Staphylococcus aureus DETECTED (A) NOT DETECTED   Methicillin resistance DETECTED (A) NOT DETECTED   Streptococcus species NOT DETECTED NOT DETECTED   Streptococcus agalactiae NOT DETECTED NOT DETECTED   Streptococcus pneumoniae NOT DETECTED NOT DETECTED   Streptococcus pyogenes NOT DETECTED NOT DETECTED   Acinetobacter baumannii NOT DETECTED NOT DETECTED   Enterobacteriaceae species NOT DETECTED NOT DETECTED   Enterobacter cloacae complex NOT DETECTED NOT DETECTED   Escherichia coli NOT DETECTED NOT DETECTED   Klebsiella oxytoca NOT DETECTED NOT DETECTED   Klebsiella pneumoniae NOT DETECTED NOT DETECTED   Proteus species NOT DETECTED NOT DETECTED   Serratia marcescens NOT DETECTED NOT DETECTED   Carbapenem resistance NOT DETECTED NOT DETECTED   Haemophilus influenzae NOT DETECTED NOT DETECTED   Neisseria meningitidis NOT DETECTED NOT DETECTED   Pseudomonas aeruginosa NOT DETECTED NOT DETECTED   Candida albicans NOT DETECTED NOT DETECTED   Candida glabrata NOT DETECTED NOT DETECTED   Candida krusei NOT DETECTED NOT DETECTED   Candida parapsilosis NOT DETECTED NOT DETECTED   Candida tropicalis NOT DETECTED NOT DETECTED    Name of physician (or Provider) Contacted: Dr. Sherene Sires  Changes to prescribed antibiotics required: probably a contaminant. No abx changes needed  Olene Floss 11/07/2015  9:49 PM

## 2015-11-07 NOTE — Progress Notes (Signed)
ANTICOAGULATION CONSULT NOTE - Initial Consult  Pharmacy Consult for heparin drip Indication: chest pain/ACS  No Known Allergies  Patient Measurements: Height: 6' (182.9 cm) Weight: 180 lb (81.647 kg) IBW/kg (Calculated) : 77.6 Heparin Dosing Weight: 82kg  Vital Signs: Temp: 98.8 F (37.1 C) (05/18 0500) Temp Source: Rectal (05/17 2145) BP: 93/75 mmHg (05/18 0500) Pulse Rate: 106 (05/18 0500)  Labs:  Recent Labs  2015-11-26 2320 11/05/15 0350 11/05/15 1029  11/05/15 1534 11/05/15 2317 11/06/15 0300 11/06/15 0748 11/07/15 0321  HGB 11.8* 10.9*  --   --   --   --  11.2*  --  10.6*  HCT 35.8* 32.4*  --   --   --   --  32.9*  --  31.7*  PLT 154 135*  --   --   --   --  141*  --  91*  APTT 35  --   --   --   --   --   --   --   --   LABPROT 17.1*  --   --   --   --   --   --   --   --   INR 1.38  --   --   --   --   --   --   --   --   HEPARINUNFRC  --   --   --   < >  --  0.46  --  0.51 <0.10*  CREATININE 8.60* 8.60*  --   < >  --  3.33*  --  2.44* 1.52*  TROPONINI 0.30* 0.62* 3.16*  --  2.82*  --   --   --   --   < > = values in this interval not displayed.  Estimated Creatinine Clearance: 42.5 mL/min (by C-G formula based on Cr of 1.52).   Medical History: Past Medical History  Diagnosis Date  . Depression     h/o psychiatric hospitalisations prior  . Hypertension   . Hyperlipidemia   . CAD (coronary artery disease)     at least 1 stent  . Hx of pancreatitis   . Rheumatoid arthritis ([facility])   . Hypothyroidism   . GERD (gastroesophageal reflux disease)   . Atrial fibrillation ([facility])     Medications:  No anticoagulation in PTA meds.  Assessment:  Goal of Therapy:  Heparin level 0.3-0.7 units/ml Monitor platelets by anticoagulation protocol: Yes   Plan:  Heparin level remains therapeutic at 0.51. Will continue heparin infusion at 1400 units/hr and f/u HL/CBC in AM.   5/18 heparin level <0.1.  According to RN, heparin drip being held per MD. Will resume  monitoring when drip restarted.  Jakeya Gherardi S 11/07/2015,5:46 AM

## 2015-11-07 NOTE — Progress Notes (Signed)
SUBJECTIVE: Patient is confused and unable to give any complaints   Filed Vitals:   11/07/15 0300 11/07/15 0400 11/07/15 0500 11/07/15 0600  BP: 82/62 94/61 93/75  97/77  Pulse: 102 63 106 105  Temp: 98.4 F (36.9 C) 98.8 F (37.1 C) 98.8 F (37.1 C) 98.6 F (37 C)  TempSrc:      Resp: 20 21 20 21   Height:      Weight:      SpO2: 100% 100% 100% 100%    Intake/Output Summary (Last 24 hours) at 11/07/15 0802 Last data filed at 11/07/15 0542  Gross per 24 hour  Intake 5938.45 ml  Output   1850 ml  Net 4088.45 ml    LABS: Basic Metabolic Panel:  Recent Labs  11/09/15 0748  11/06/15 2042 11/07/15 0321  NA 147*  --   --  144  K 2.6*  < > 3.3* 4.4  CL 118*  --   --  120*  CO2 19*  --   --  18*  GLUCOSE 119*  --   --  146*  BUN 59*  --   --  44*  CREATININE 2.44*  --   --  1.52*  CALCIUM 7.5*  --   --  7.1*  MG 1.9  --   --  1.6*  PHOS 2.9  --   --  2.1*  < > = values in this interval not displayed. Liver Function Tests:  Recent Labs  Nov 30, 2015 2320  AST 28  ALT 31  ALKPHOS 69  BILITOT 0.8  PROT 6.0*  ALBUMIN 2.5*   No results for input(s): LIPASE, AMYLASE in the last 72 hours. CBC:  Recent Labs  11/06/15 0300 11/07/15 0321  WBC 20.2* 25.7*  HGB 11.2* 10.6*  HCT 32.9* 31.7*  MCV 93.8 94.5  PLT 141* 91*   Cardiac Enzymes:  Recent Labs  11/05/15 0350 11/05/15 1029 11/05/15 1534  TROPONINI 0.62* 3.16* 2.82*   BNP: Invalid input(s): POCBNP D-Dimer: No results for input(s): DDIMER in the last 72 hours. Hemoglobin A1C: No results for input(s): HGBA1C in the last 72 hours. Fasting Lipid Panel:  Recent Labs  11/05/15 1331  CHOL 95  HDL 31*  LDLCALC 46  TRIG 92  CHOLHDL 3.1   Thyroid Function Tests:  Recent Labs  11/05/15 1331  T3FREE 0.8*   Anemia Panel:  Recent Labs  11/05/15 1331  VITAMINB12 417     PHYSICAL EXAM General: Well developed, well nourished, in no acute distress HEENT:  Normocephalic and atramatic Neck:   No JVD.  Lungs: Clear bilaterally to auscultation and percussion. Heart: HRRR . Normal S1 and S2 without gallops or murmurs.  Abdomen: Bowel sounds are positive, abdomen soft and non-tender  Msk:  Back normal, normal gait. Normal strength and tone for age. Extremities: No clubbing, cyanosis or edema.   Neuro: Alert and oriented X 3. Psych:  Good affect, responds appropriately  TELEMETRY:Monitor shows sinus rhythm at 80 bpm  ASSESSMENT AND PLAN: Paroxysmal SVT and ventricular tachycardia in a setting with renal failure and hypokalemia. Potassium is normal today and the heart rate is also much better. May start Cardizem drip if he has recurrence of SVT.  Principal Problem:   Wide-complex tachycardia (HCC) Active Problems:   Altered mental status   Acute renal failure (ARF) (HCC)   Ventricular tachyarrhythmia (HCC)    Varnell Orvis A, MD, [facility] 11/07/2015 8:02 AM

## 2015-11-07 NOTE — Consult Note (Signed)
Pharmacy Antibiotic Note  Matthew Foley is a 82 y.o. male admitted on Nov 12, 2015 with sepsis.  Pharmacy has been consulted for ceftazidime and vancomycin dosing.  Plan: Patient's serum creatinine improved significantly overnight.   Will change to Vancomycin 1g IV q18 hours and Ceftazidime 2 g IV q12 hours. Will continue to follow renal function and overall clinical picture closely.  Vancomycin trough level ordered prior to the 06:00 dose on 5/21.   Height: 6' (182.9 cm) Weight: 180 lb (81.647 kg) IBW/kg (Calculated) : 77.6  Temp (24hrs), Avg:99.4 F (37.4 C), Min:98.4 F (36.9 C), Max:102.7 F (39.3 C)   Recent Labs Lab Nov 12, 2015 2320 11/05/15 0336 11/05/15 0350 11/05/15 1331 11/05/15 2317 11/06/15 0300 11/06/15 0748 11/07/15 0321  WBC 19.9*  --  20.4*  --   --  20.2*  --  25.7*  CREATININE 8.60*  --  8.60* 6.11* 3.33*  --  2.44* 1.52*  LATICACIDVEN 1.4 1.2  --   --   --   --   --   --     Estimated Creatinine Clearance: 42.5 mL/min (by C-G formula based on Cr of 1.52).    No Known Allergies  Antimicrobials this admission: ceftaz 5/17 >>  vancomycin 5/17 >>   Dose adjustments this admission: Vancomycin 1 g IV q18 hours on 5/18.   Microbiology results: 5/17 BCx: pending 5/17 UCx: pending   5/16 MRSA PCR: positive  Thank you for allowing pharmacy to be a part of this patient's care.  Demetrius Charity, PharmD  Clinical Pharmacist   11/07/2015 9:08 AM

## 2015-11-07 NOTE — Progress Notes (Signed)
Subjective:  Lethargic today. Had temp spike  Not following commands this AM S Cr has improved Potassium has improved    Objective:  Vital signs in last 24 hours:  Temp:  [98.4 F (36.9 C)-102.7 F (39.3 C)] 98.8 F (37.1 C) (05/18 0800) Pulse Rate:  [33-127] 84 (05/18 0800) Resp:  [20-33] 24 (05/18 0800) BP: (69-128)/(42-90) 101/62 mmHg (05/18 0800) SpO2:  [77 %-100 %] 100 % (05/18 0800)  Weight change:  Filed Weights   11/03/2015 2315  Weight: 81.647 kg (180 lb)    Intake/Output:    Intake/Output Summary (Last 24 hours) at 11/07/15 0852 Last data filed at 11/07/15 0824  Gross per 24 hour  Intake 6908.59 ml  Output   2050 ml  Net 4858.59 ml     Physical Exam: General: NAD, laying in bed  HEENT Anicteric, dry mouth  Neck supple  Pulm/lungs Crackles at bases b/l, coarse breath sounds  CVS/Heart Iregular, a fib, tachycardic  Abdomen:  Soft, NT, ND  Extremities: No peripheral edema, toe deformities  Neurologic: Not following commands  Skin: Heel ulcers, feet deformities b/l          Basic Metabolic Panel:   Recent Labs Lab 11/05/15 0350 11/05/15 1331 11/05/15 2206 11/05/15 2317 11/06/15 0748 11/06/15 1449 11/06/15 2042 11/07/15 0321  NA 137 138  --  146* 147*  --   --  144  K 6.0* 3.9  --  2.6* 2.6* 3.2* 3.3* 4.4  CL 108 110  --  119* 118*  --   --  120*  CO2 15* 15*  --  18* 19*  --   --  18*  GLUCOSE 154* 145*  --  122* 119*  --   --  146*  BUN 133* 114*  --  77* 59*  --   --  44*  CREATININE 8.60* 6.11*  --  3.33* 2.44*  --   --  1.52*  CALCIUM 8.9 8.0*  --  7.7* 7.5*  --   --  7.1*  MG  --   --  1.5*  --  1.9  --   --  1.6*  PHOS  --   --   --   --  2.9  --   --  2.1*     CBC:  Recent Labs Lab 11/07/2015 2320 11/05/15 0350 11/06/15 0300 11/07/15 0321  WBC 19.9* 20.4* 20.2* 25.7*  HGB 11.8* 10.9* 11.2* 10.6*  HCT 35.8* 32.4* 32.9* 31.7*  MCV 95.6 95.3 93.8 94.5  PLT 154 135* 141* 91*      Microbiology:  Recent Results (from  the past 720 hour(s))  MRSA PCR Screening     Status: Abnormal   Collection Time: 11/05/15  3:48 AM  Result Value Ref Range Status   MRSA by PCR POSITIVE (A) NEGATIVE Final    Comment:        The GeneXpert MRSA Assay (FDA approved for NASAL specimens only), is one component of a comprehensive MRSA colonization surveillance program. It is not intended to diagnose MRSA infection nor to guide or monitor treatment for MRSA infections. CALLED TO EMMA EGWUATU @ 0515 ON 11/05/2015 BY CAF   Urine culture     Status: Abnormal   Collection Time: 11/05/15 10:00 AM  Result Value Ref Range Status   Specimen Description URINE, CATHETERIZED  Final   Special Requests NONE  Final   Culture (A)  Final    >=100,000 COLONIES/mL METHICILLIN RESISTANT STAPHYLOCOCCUS AUREUS   Report Status 11/07/2015  FINAL  Final   Organism ID, Bacteria METHICILLIN RESISTANT STAPHYLOCOCCUS AUREUS (A)  Final      Susceptibility   Methicillin resistant staphylococcus aureus - MIC*    CIPROFLOXACIN >=8 RESISTANT Resistant     GENTAMICIN <=0.5 SENSITIVE Sensitive     NITROFURANTOIN <=16 SENSITIVE Sensitive     OXACILLIN >=4 RESISTANT Resistant     TETRACYCLINE <=1 SENSITIVE Sensitive     VANCOMYCIN 1 SENSITIVE Sensitive     TRIMETH/SULFA <=10 SENSITIVE Sensitive     CLINDAMYCIN <=0.25 SENSITIVE Sensitive     RIFAMPIN <=0.5 SENSITIVE Sensitive     Inducible Clindamycin NEGATIVE Sensitive     * >=100,000 COLONIES/mL METHICILLIN RESISTANT STAPHYLOCOCCUS AUREUS    Coagulation Studies:  Recent Labs  20-Nov-2015 2320  LABPROT 17.1*  INR 1.38    Urinalysis:  Recent Labs  11/05/15 1000  COLORURINE YELLOW*  LABSPEC 1.005  PHURINE 6.0  GLUCOSEU NEGATIVE  HGBUR 1+*  BILIRUBINUR NEGATIVE  KETONESUR NEGATIVE  PROTEINUR NEGATIVE  NITRITE NEGATIVE  LEUKOCYTESUR NEGATIVE      Imaging: Ct Head Wo Contrast  11/05/2015  CLINICAL DATA:  Confusion and recent cardioversion EXAM: CT HEAD WITHOUT CONTRAST TECHNIQUE:  Contiguous axial images were obtained from the base of the skull through the vertex without intravenous contrast. COMPARISON:  10/28/2015 FINDINGS: Bony calvarium is intact. No gross soft tissue abnormality is seen. Mild atrophic changes are again identified. No findings to suggest acute hemorrhage, acute infarction or space-occupying mass lesion are noted. Fluid is again seen within the right mastoid air cells. IMPRESSION: No significant interval change from the prior exam. Electronically Signed   By: Alcide Clever M.D.   On: 11/05/2015 12:37   US Renal  11/05/2015  CLINICAL DATA:  New EXAM: RENAL / URINARY TRACT ULTRASOUND COMPLETE COMPARISON:  CT, 04/25/2014. FINDINGS: Right Kidney: Length: 11.3 cm. Borderline increased parenchymal echogenicity. Small echogenic foci likely vascular calcifications. No masses. No hydronephrosis. Left Kidney: Length: 9.5 cm. Normal parenchymal echogenicity. No masses or definitive stones. Small lesions seen in the prior CT are not resolved sonographically. No hydronephrosis. Bladder: Foley catheter in place, with bladder still mildly distended. There is wall thickening, most evident posteriorly. No discrete mass. IMPRESSION: 1. No acute findings.  No hydronephrosis. 2. Small left renal lesions seen on the prior CT are not evident sonographically. No renal masses. 3. Bladder wall thickening. This may be due to an infectious or inflammatory cystitis. It could be the result of chronic bladder outlet obstruction. No convincing bladder mass. Electronically Signed   By: Amie Portland M.D.   On: 11/05/2015 13:14   Dg Chest Port 1 View  11/06/2015  CLINICAL DATA:  Atrial fibrillation and hypertension. EXAM: PORTABLE CHEST 1 VIEW COMPARISON:  20-Nov-2015 FINDINGS: There is patchy infiltrate in the left base at the level of the left hemidiaphragm. The lungs elsewhere are clear. Heart is upper normal in size with pulmonary vascularity within normal limits. No adenopathy. No bone lesions.  IMPRESSION: Patchy infiltrate left base. Lungs elsewhere clear. Stable cardiac silhouette. Electronically Signed   By: Bretta Bang III M.D.   On: 11/06/2015 20:18     Medications:   . 0.45 % NaCl with KCl 20 mEq / L 125 mL/hr at 11/07/15 0800  . amiodarone 30 mg/hr (11/07/15 0800)  . phenylephrine (NEO-SYNEPHRINE) Adult infusion 30 mcg/min (11/07/15 0824)   . aspirin EC  81 mg Oral Daily  . atorvastatin  40 mg Oral q1800  . cefTAZidime (FORTAZ)  IV  2  g Intravenous Q24H  . hydrocortisone sodium succinate  50 mg Intravenous Q6H  . levothyroxine  50 mcg Intravenous Daily  . mupirocin ointment   Nasal BID  . pantoprazole (PROTONIX) IV  40 mg Intravenous Q24H  . sodium chloride flush  3 mL Intravenous Q12H  . sodium hypochlorite   Irrigation Daily  . sodium phosphate  Dextrose 5% IVPB  20 mmol Intravenous Once  . vancomycin  1,000 mg Intravenous Q24H   acetaminophen **OR** acetaminophen, metoprolol, ondansetron **OR** ondansetron (ZOFRAN) IV  Assessment/ Plan:  82 y.o.caucasian male with a PMHX of Hypertension, hyperlipidemia, coronary disease, rheumatoid arthritis, hypothyroidism, atrial fibrillation, was admitted on 11/19/2015 with A. fib, RVR requiring cardioversion, acute renal failure with severe hyperkalemia.   1. Acute renal failure. Bedside bladder scan revealed urine greater than 1000 cc. A Foley was placed, over 2300 cc of urine was obtained immediately Therefore, acute renal failure is likely secondary to urinary retention Would continue Flomax Continue IV fluids as patient appears quite dehydrated Continue Foley for now.  S Cr improved to 1.52 UOP 1850  2. Severe hyperkalemia, now Hypokalemia Agree with 1/2 NS with Kcl supplements Level has improved  3. Altered mental status ? Pneumonia     LOS: 2 Trishna Cwik 5/18/20178:52 AM

## 2015-11-08 ENCOUNTER — Inpatient Hospital Stay: Payer: Medicare PPO

## 2015-11-08 DIAGNOSIS — J189 Pneumonia, unspecified organism: Secondary | ICD-10-CM

## 2015-11-08 DIAGNOSIS — G9340 Encephalopathy, unspecified: Secondary | ICD-10-CM

## 2015-11-08 LAB — CBC
HCT: 31.4 % — ABNORMAL LOW (ref 40.0–52.0)
HEMOGLOBIN: 10.4 g/dL — AB (ref 13.0–18.0)
MCH: 31.6 pg (ref 26.0–34.0)
MCHC: 32.9 g/dL (ref 32.0–36.0)
MCV: 95.8 fL (ref 80.0–100.0)
Platelets: 55 10*3/uL — ABNORMAL LOW (ref 150–440)
RBC: 3.28 MIL/uL — AB (ref 4.40–5.90)
RDW: 15.4 % — ABNORMAL HIGH (ref 11.5–14.5)
WBC: 15.4 10*3/uL — ABNORMAL HIGH (ref 3.8–10.6)

## 2015-11-08 LAB — BASIC METABOLIC PANEL
ANION GAP: 9 (ref 5–15)
BUN: 77 mg/dL — ABNORMAL HIGH (ref 6–20)
CALCIUM: 7.7 mg/dL — AB (ref 8.9–10.3)
CO2: 18 mmol/L — ABNORMAL LOW (ref 22–32)
Chloride: 119 mmol/L — ABNORMAL HIGH (ref 101–111)
Creatinine, Ser: 3.33 mg/dL — ABNORMAL HIGH (ref 0.61–1.24)
GFR, EST AFRICAN AMERICAN: 19 mL/min — AB (ref >60)
GFR, EST NON AFRICAN AMERICAN: 16 mL/min — AB (ref >60)
GLUCOSE: 122 mg/dL — AB (ref 65–99)
Potassium: 2.6 mmol/L — CL (ref 3.5–5.1)
Sodium: 146 mmol/L — ABNORMAL HIGH (ref 135–145)

## 2015-11-08 LAB — COMPREHENSIVE METABOLIC PANEL
ALK PHOS: 75 U/L (ref 38–126)
ALT: 42 U/L (ref 17–63)
AST: 70 U/L — ABNORMAL HIGH (ref 15–41)
Albumin: 1.4 g/dL — ABNORMAL LOW (ref 3.5–5.0)
Anion gap: 5 (ref 5–15)
BUN: 30 mg/dL — ABNORMAL HIGH (ref 6–20)
CALCIUM: 7.4 mg/dL — AB (ref 8.9–10.3)
CO2: 18 mmol/L — ABNORMAL LOW (ref 22–32)
CREATININE: 1.09 mg/dL (ref 0.61–1.24)
Chloride: 117 mmol/L — ABNORMAL HIGH (ref 101–111)
Glucose, Bld: 156 mg/dL — ABNORMAL HIGH (ref 65–99)
Potassium: 3.5 mmol/L (ref 3.5–5.1)
Sodium: 140 mmol/L (ref 135–145)
Total Bilirubin: 1 mg/dL (ref 0.3–1.2)
Total Protein: 4.7 g/dL — ABNORMAL LOW (ref 6.5–8.1)

## 2015-11-08 LAB — MAGNESIUM: Magnesium: 1.5 mg/dL — ABNORMAL LOW (ref 1.7–2.4)

## 2015-11-08 LAB — PROCALCITONIN: PROCALCITONIN: 10.33 ng/mL

## 2015-11-08 LAB — PHOSPHORUS: Phosphorus: 2.6 mg/dL (ref 2.5–4.6)

## 2015-11-08 MED ORDER — MAGNESIUM SULFATE 2 GM/50ML IV SOLN
2.0000 g | Freq: Once | INTRAVENOUS | Status: AC
  Administered 2015-11-08: 2 g via INTRAVENOUS
  Filled 2015-11-08: qty 50

## 2015-11-08 MED ORDER — VITAL HIGH PROTEIN PO LIQD
1000.0000 mL | ORAL | Status: DC
  Administered 2015-11-08: 1000 mL

## 2015-11-08 MED ORDER — POTASSIUM CHLORIDE 10 MEQ/100ML IV SOLN
10.0000 meq | INTRAVENOUS | Status: AC
Start: 2015-11-08 — End: 2015-11-08
  Administered 2015-11-08 (×4): 10 meq via INTRAVENOUS
  Filled 2015-11-08 (×4): qty 100

## 2015-11-08 MED ORDER — DIGOXIN 0.25 MG/ML IJ SOLN
0.2500 mg | Freq: Every day | INTRAMUSCULAR | Status: DC
  Administered 2015-11-08 – 2015-11-13 (×6): 0.25 mg via INTRAVENOUS
  Filled 2015-11-08: qty 1
  Filled 2015-11-08: qty 2
  Filled 2015-11-08 (×5): qty 1

## 2015-11-08 MED ORDER — VANCOMYCIN HCL IN DEXTROSE 1-5 GM/200ML-% IV SOLN
1000.0000 mg | Freq: Two times a day (BID) | INTRAVENOUS | Status: DC
  Administered 2015-11-08 (×2): 1000 mg via INTRAVENOUS
  Filled 2015-11-08 (×4): qty 200

## 2015-11-08 MED ORDER — DILTIAZEM HCL 100 MG IV SOLR
5.0000 mg/h | INTRAVENOUS | Status: DC
  Filled 2015-11-08: qty 100

## 2015-11-08 MED ORDER — HYDROCORTISONE NA SUCCINATE PF 100 MG IJ SOLR
50.0000 mg | Freq: Two times a day (BID) | INTRAMUSCULAR | Status: DC
  Administered 2015-11-08 – 2015-11-09 (×2): 50 mg via INTRAVENOUS
  Filled 2015-11-08 (×2): qty 1
  Filled 2015-11-08: qty 2
  Filled 2015-11-08: qty 1

## 2015-11-08 MED ORDER — JEVITY 1.5 CAL/FIBER PO LIQD
1000.0000 mL | ORAL | Status: DC
  Administered 2015-11-08 – 2015-11-10 (×3): 1000 mL

## 2015-11-08 MED ORDER — PRO-STAT SUGAR FREE PO LIQD
30.0000 mL | Freq: Three times a day (TID) | ORAL | Status: DC
  Administered 2015-11-08 – 2015-11-13 (×13): 30 mL via ORAL

## 2015-11-08 MED ORDER — DEXTROSE 5 % IV SOLN
1.0000 g | Freq: Two times a day (BID) | INTRAVENOUS | Status: DC
  Administered 2015-11-08 – 2015-11-09 (×4): 1 g via INTRAVENOUS
  Filled 2015-11-08 (×6): qty 1

## 2015-11-08 NOTE — Consult Note (Signed)
Pharmacy Antibiotic Note  Matthew Foley is a 82 y.o. male admitted on 10/21/2015 with sepsis.  Pharmacy has been consulted for ceftazidime and vancomycin dosing.  Plan: Patient's serum creatinine improved significantly overnight again. Scr now is 1.09 mg/dl; Estimated CrCl: ~60 ml/min   Will change to Vancomycin 1g IV q12 hours. Ceftazidime change to Cefepime 1 g IV q12 hours due to national shortage of Ceftazidime.   Will continue to follow renal function and overall clinical picture closely.  Vancomycin trough level ordered prior to the 1100 dose on 5/20   Height: 6' (182.9 cm) Weight: 180 lb (81.647 kg) IBW/kg (Calculated) : 77.6  Temp (24hrs), Avg:99.4 F (37.4 C), Min:98.6 F (37 C), Max:100 F (37.8 C)   Recent Labs Lab 10/27/2015 2320 11/05/15 0336 11/05/15 0350 11/05/15 1331 11/05/15 2317 11/06/15 0300 11/06/15 0748 11/07/15 0321 11/08/15 0348  WBC 19.9*  --  20.4*  --   --  20.2*  --  25.7* 15.4*  CREATININE 8.60*  --  8.60* 6.11* 3.33*  --  2.44* 1.52* 1.09  LATICACIDVEN 1.4 1.2  --   --   --   --   --   --   --     Estimated Creatinine Clearance: 59.3 mL/min (by C-G formula based on Cr of 1.09).    No Known Allergies  Antimicrobials this admission: ceftaz 5/17 >>  vancomycin 5/17 >>   Dose adjustments this admission: Vancomycin 1 g IV q18 hours on 5/18.  Vancomycin 1 g IV q12 hours on 5/19   Microbiology results: 5/17 BCx: STAPHYLOCOCCUS AUREUS  AEROBIC BOTTLE ONLY  GRAM POSITIVE RODS  ANAEROBIC BOTTLE ONLY  5/17 UCx: pending   5/16 MRSA PCR: positive  Thank you for allowing pharmacy to be a part of this patient's care.  Demetrius Charity, PharmD  Clinical Pharmacist   11/08/2015 9:03 AM

## 2015-11-08 NOTE — Progress Notes (Signed)
Speech Therapy Note: reviewed chart notes; consulted NSG re: pt's status today. Pt has had a decline in medical status and is less responsive. Pt was started on pressors for septic shock. Pt is NPO and receiving an NG tube today for medication, nutrition. ST services will f/u w/ pt's status next 1-3 days and proceed w/ trials of ice chips, thin liquids if appropriate and able to tolerate safely in light of the NG tube placed. NSG agreed.

## 2015-11-08 NOTE — Progress Notes (Signed)
Matthew Foley Date of Admission:  2015-12-04     ID: Matthew Foley is a 82 y.o. male with MRSA bacteremia, ARF due to urinary retention with MRSA UTI, sacral decubitus ulcer  Principal Problem:   Wide-complex tachycardia (HCC) Active Problems:   Altered mental status   Acute renal failure (ARF) (HCC)   Ventricular tachyarrhythmia (HCC)   Subjective: Remains lethargic, no real fevers, off pressor  ROS  Unable to obtain  Medications:  Antibiotics Given (last 72 hours)    Date/Time Action Medication Dose Rate   11/06/15 2008 Given   vancomycin (VANCOCIN) 1,250 mg in sodium chloride 0.9 % 250 mL IVPB 1,250 mg 166.7 mL/hr   11/06/15 2018 Given   cefTAZidime (FORTAZ) 2 g in dextrose 5 % 50 mL IVPB 2 g 100 mL/hr   11/07/15 0603 Given   vancomycin (VANCOCIN) IVPB 1000 mg/200 mL premix 1,000 mg 200 mL/hr   11/07/15 1000 Given   cefTAZidime (FORTAZ) 2 g in dextrose 5 % 50 mL IVPB 2 g 100 mL/hr   11/07/15 2047 Given   cefTAZidime (FORTAZ) 2 g in dextrose 5 % 50 mL IVPB 2 g 100 mL/hr   11/07/15 2342 Given   vancomycin (VANCOCIN) IVPB 1000 mg/200 mL premix 1,000 mg 200 mL/hr   11/08/15 0950 Given   cefTAZidime (FORTAZ) 2 g in dextrose 5 % 50 mL IVPB 2 g 100 mL/hr   11/08/15 1051 Given   vancomycin (VANCOCIN) IVPB 1000 mg/200 mL premix 1,000 mg 200 mL/hr   11/08/15 1150 Given   ceFEPIme (MAXIPIME) 1 g in dextrose 5 % 50 mL IVPB 1 g 100 mL/hr     . aspirin EC  81 mg Oral Daily  . atorvastatin  40 mg Oral q1800  . ceFEPime (MAXIPIME) IV  1 g Intravenous Q12H  . digoxin  0.25 mg Intravenous Daily  . feeding supplement (VITAL HIGH PROTEIN)  1,000 mL Per Tube Q24H  . heparin subcutaneous  5,000 Units Subcutaneous Q8H  . hydrocortisone sodium succinate  50 mg Intravenous Q12H  . levothyroxine  50 mcg Intravenous Daily  . mupirocin ointment   Nasal BID  . pantoprazole (PROTONIX) IV  40 mg Intravenous Q24H  . potassium chloride  10 mEq Intravenous  Q1 Hr x 4  . sodium chloride flush  3 mL Intravenous Q12H  . sodium hypochlorite   Irrigation Daily  . vancomycin  1,000 mg Intravenous Q12H    Objective: Vital signs in last 24 hours: Temp:  [98.6 F (37 C)-100 F (37.8 C)] 99.5 F (37.5 C) (05/19 1200) Pulse Rate:  [30-121] 109 (05/19 1200) Resp:  [25-34] 25 (05/19 1200) BP: (102-138)/(67-99) 117/78 mmHg (05/19 1200) SpO2:  [86 %-100 %] 100 % (05/19 1200) Constitutional: frail, thin critically ill appearing HENT: PERRLA anicteric Mouth/Throat: Oropharynx is clear and dry. No oropharyngeal exudate.  Cardiovascular:Tachy No murmur heard.  Pulmonary/Chest: Effort normal and breath sounds normal. No respiratory distress. He has no wheezes.  Abdominal: Soft. Bowel sounds are normal. He exhibits no distension. There is no tenderness.  Lymphadenopathy: He has no cervical adenopathy.  Neurological: lethargic Skin: R shoulder decub with dry eschar, sacral ulcers  Psychiatric: lethargic  Lab Results  Recent Labs  11/07/15 0321 11/08/15 0348  WBC 25.7* 15.4*  HGB 10.6* 10.4*  HCT 31.7* 31.4*  NA 144 140  K 4.4 3.5  CL 120* 117*  CO2 18* 18*  BUN 44* 30*  CREATININE 1.52* 1.09    Microbiology: Results for  orders placed or performed during the hospital encounter of 11/17/2015  MRSA PCR Screening     Status: Abnormal   Collection Time: 11/05/15  3:48 AM  Result Value Ref Range Status   MRSA by PCR POSITIVE (A) NEGATIVE Final    Comment:        The GeneXpert MRSA Assay (FDA approved for NASAL specimens only), is one component of a comprehensive MRSA colonization surveillance program. It is not intended to diagnose MRSA infection nor to guide or monitor treatment for MRSA infections. CALLED TO Matthew EGWUATU @ 0515 ON 11/05/2015 BY CAF   Urine culture     Status: Abnormal   Collection Time: 11/05/15 10:00 AM  Result Value Ref Range Status   Specimen Description URINE, CATHETERIZED  Final   Special Requests NONE  Final    Culture (A)  Final    >=100,000 COLONIES/mL METHICILLIN RESISTANT STAPHYLOCOCCUS AUREUS   Report Status 11/07/2015 FINAL  Final   Organism ID, Bacteria METHICILLIN RESISTANT STAPHYLOCOCCUS AUREUS (A)  Final      Susceptibility   Methicillin resistant staphylococcus aureus - MIC*    CIPROFLOXACIN >=8 RESISTANT Resistant     GENTAMICIN <=0.5 SENSITIVE Sensitive     NITROFURANTOIN <=16 SENSITIVE Sensitive     OXACILLIN >=4 RESISTANT Resistant     TETRACYCLINE <=1 SENSITIVE Sensitive     VANCOMYCIN 1 SENSITIVE Sensitive     TRIMETH/SULFA <=10 SENSITIVE Sensitive     CLINDAMYCIN <=0.25 SENSITIVE Sensitive     RIFAMPIN <=0.5 SENSITIVE Sensitive     Inducible Clindamycin NEGATIVE Sensitive     * >=100,000 COLONIES/mL METHICILLIN RESISTANT STAPHYLOCOCCUS AUREUS  Urine culture     Status: Abnormal (Preliminary result)   Collection Time: 11/06/15  6:29 PM  Result Value Ref Range Status   Specimen Description URINE, CATHETERIZED  Final   Special Requests Normal  Final   Culture >=100,000 COLONIES/mL STAPHYLOCOCCUS AUREUS (A)  Final   Report Status PENDING  Incomplete  CULTURE, BLOOD (ROUTINE X 2) w Reflex to ID Panel     Status: Abnormal (Preliminary result)   Collection Time: 11/06/15  8:28 PM  Result Value Ref Range Status   Specimen Description BLOOD LEFT HAND  Final   Special Requests BOTTLES DRAWN AEROBIC AND ANAEROBIC 1CCAERO,3CCANA  Final   Culture  Setup Time   Final    GRAM POSITIVE COCCI AEROBIC BOTTLE ONLY GRAM POSITIVE RODS ANAEROBIC BOTTLE ONLY CRITICAL RESULT CALLED TO, READ BACK BY AND VERIFIED WITH: Matthew Foley 11/07/15 1115 MLM GPR PREVIOUSLY CALLED 11/07/15 MLZ     Culture (A)  Final    STAPHYLOCOCCUS AUREUS AEROBIC BOTTLE ONLY GRAM POSITIVE RODS ANAEROBIC BOTTLE ONLY    Report Status PENDING  Incomplete  Blood Culture ID Panel (Reflexed)     Status: Abnormal   Collection Time: 11/06/15  8:28 PM  Result Value Ref Range Status   Enterococcus species NOT  DETECTED NOT DETECTED Final   Vancomycin resistance NOT DETECTED NOT DETECTED Final   Listeria monocytogenes NOT DETECTED NOT DETECTED Final   Staphylococcus species DETECTED (A) NOT DETECTED Final    Comment: CRITICAL RESULT CALLED TO, READ BACK BY AND VERIFIED WITH: Matthew Foley 11/07/15 1115 MLM    Staphylococcus aureus DETECTED (A) NOT DETECTED Final    Comment: Matthew Foley 11/07/15 1115 MLM   Methicillin resistance DETECTED (A) NOT DETECTED Final    Comment: CRITICAL RESULT CALLED TO, READ BACK BY AND VERIFIED WITH: Matthew Foley 11/07/15 1115 MLM    Streptococcus species NOT DETECTED  NOT DETECTED Final   Streptococcus agalactiae NOT DETECTED NOT DETECTED Final   Streptococcus pneumoniae NOT DETECTED NOT DETECTED Final   Streptococcus pyogenes NOT DETECTED NOT DETECTED Final   Acinetobacter baumannii NOT DETECTED NOT DETECTED Final   Enterobacteriaceae species NOT DETECTED NOT DETECTED Final   Enterobacter cloacae complex NOT DETECTED NOT DETECTED Final   Escherichia coli NOT DETECTED NOT DETECTED Final   Klebsiella oxytoca NOT DETECTED NOT DETECTED Final   Klebsiella pneumoniae NOT DETECTED NOT DETECTED Final   Proteus species NOT DETECTED NOT DETECTED Final   Serratia marcescens NOT DETECTED NOT DETECTED Final   Carbapenem resistance NOT DETECTED NOT DETECTED Final   Haemophilus influenzae NOT DETECTED NOT DETECTED Final   Neisseria meningitidis NOT DETECTED NOT DETECTED Final   Pseudomonas aeruginosa NOT DETECTED NOT DETECTED Final   Candida albicans NOT DETECTED NOT DETECTED Final   Candida glabrata NOT DETECTED NOT DETECTED Final   Candida krusei NOT DETECTED NOT DETECTED Final   Candida parapsilosis NOT DETECTED NOT DETECTED Final   Candida tropicalis NOT DETECTED NOT DETECTED Final  CULTURE, BLOOD (ROUTINE X 2) w Reflex to ID Panel     Status: None (Preliminary result)   Collection Time: 11/06/15  8:36 PM  Result Value Ref Range Status   Specimen  Description BLOOD RIGHT HAND  Final   Special Requests BOTTLES DRAWN AEROBIC AND ANAEROBIC 3CCAERO,2CCANA  Final   Culture  Setup Time   Final    GRAM POSITIVE RODS CRITICAL RESULT CALLED TO, READ BACK BY AND VERIFIED WITH: MELISSA MACCIA AT 1934 11/07/15 MLZ  CONFIRMED BY MSS ANAEROBIC BOTTLE ONLY    Culture GRAM POSITIVE RODS  Final   Report Status PENDING  Incomplete    Studies/Results: Dg Abd 1 View  11/08/2015  CLINICAL DATA:  Encounter for nasal/ orogastric tube placement. EXAM: ABDOMEN - 1 VIEW COMPARISON:  CT, 04/25/2014 FINDINGS: Nasal/orogastric tube passes just below the left hemidiaphragm into the proximal stomach. The side hole of the tube projects in the distal esophagus near the GE junction. Recommend further inserting another 10 cm. Normal bowel gas pattern.  Status post cholecystectomy. IMPRESSION: NG tube tip projects in the proximal stomach with side hole in the distal esophagus near the GE junction. Recommend further inserting another 10 cm. Electronically Signed   By: Matthew Portland M.D.   On: 11/08/2015 11:44   Dg Chest Port 1 View  11/08/2015  CLINICAL DATA:  Respiratory failure. EXAM: PORTABLE CHEST 1 VIEW COMPARISON:  11/06/2015 FINDINGS: Normal cardiac silhouette. There is increased perihilar airspace densities which extend into the RIGHT lower lobe and LEFT upper lobe. LEFT basilar atelectasis per No pneumothorax. IMPRESSION: New perihilar airspace densities suggests pulmonary edema. Electronically Signed   By: Matthew Bi M.D.   On: 11/08/2015 07:31   Dg Chest Port 1 View  11/06/2015  CLINICAL DATA:  Atrial fibrillation and hypertension. EXAM: PORTABLE CHEST 1 VIEW COMPARISON:  Nov 04, 2015 FINDINGS: There is patchy infiltrate in the left base at the level of the left hemidiaphragm. The lungs elsewhere are clear. Heart is upper normal in size with pulmonary vascularity within normal limits. No adenopathy. No bone lesions. IMPRESSION: Patchy infiltrate left base.  Lungs elsewhere clear. Stable cardiac silhouette. Electronically Signed   By: Matthew Bang III M.D.   On: 11/06/2015 20:18    Assessment/Plan: Matthew Foley is a 82 y.o. male with Staph aurues bacteremia, likely from UTI with acute urinary retention. He was recently admitted 5/8-11 following a fall with  confusion Rhabdo, AKD. It does not appear cultures were done at that time although did have wbc 17 at admit 5/8. He was afebrile throughout that admission and his wbc went down to 10 with no antibiotics. Currently he is in poor condition, has a sacral decub. Some concern for PNA as well WBC down to 15, low grade temps but no fevers. Repeat bcx 5/18 pending.   Recommendations Continue vancomycin Continue cefepime for possible PNA, and decub ulcer Repeat bcx pending to document clearance He has had a TTE which is neg for vegetations but may need a TEE if follow up blood cultures are positive. If repeat BCX are negative I would defer on TEE given his frail condition. WIll need minimum 2 weeks of IV vanco for the MRSA bacteremia Thank you very much for the consult. Will follow with you.  Matthew Foley   11/08/2015, 12:29 PM

## 2015-11-08 NOTE — Progress Notes (Signed)
SUBJECTIVE: Patient is still confused and I'm unable to give much history   Filed Vitals:   11/08/15 0400 11/08/15 0500 11/08/15 0600 11/08/15 0700  BP: 116/76 116/80 107/67 110/91  Pulse: 104 105 114 119  Temp: 99.9 F (37.7 C) 99.9 F (37.7 C) 99.9 F (37.7 C) 100 F (37.8 C)  TempSrc:      Resp: 26 31 26 30   Height:      Weight:      SpO2: 100% 100% 100% 100%    Intake/Output Summary (Last 24 hours) at 11/08/15 0843 Last data filed at 11/08/15 0600  Gross per 24 hour  Intake 4285.66 ml  Output   1425 ml  Net 2860.66 ml    LABS: Basic Metabolic Panel:  Recent Labs  11/10/15 0321 11/08/15 0348  NA 144 140  K 4.4 3.5  CL 120* 117*  CO2 18* 18*  GLUCOSE 146* 156*  BUN 44* 30*  CREATININE 1.52* 1.09  CALCIUM 7.1* 7.4*  MG 1.6* 1.5*  PHOS 2.1* 2.6   Liver Function Tests:  Recent Labs  11/08/15 0348  AST 70*  ALT 42  ALKPHOS 75  BILITOT 1.0  PROT 4.7*  ALBUMIN 1.4*   No results for input(s): LIPASE, AMYLASE in the last 72 hours. CBC:  Recent Labs  11/07/15 0321 11/08/15 0348  WBC 25.7* 15.4*  HGB 10.6* 10.4*  HCT 31.7* 31.4*  MCV 94.5 95.8  PLT 91* 55*   Cardiac Enzymes:  Recent Labs  11/05/15 1029 11/05/15 1534  TROPONINI 3.16* 2.82*   BNP: Invalid input(s): POCBNP D-Dimer: No results for input(s): DDIMER in the last 72 hours. Hemoglobin A1C: No results for input(s): HGBA1C in the last 72 hours. Fasting Lipid Panel:  Recent Labs  11/05/15 1331  CHOL 95  HDL 31*  LDLCALC 46  TRIG 92  CHOLHDL 3.1   Thyroid Function Tests:  Recent Labs  11/05/15 1331  T3FREE 0.8*   Anemia Panel:  Recent Labs  11/05/15 1331  VITAMINB12 417     PHYSICAL EXAM General: Well developed, well nourished, in no acute distress HEENT:  Normocephalic and atramatic Neck:  No JVD.  Lungs: Clear bilaterally to auscultation and percussion. Heart: HRRR . Normal S1 and S2 without gallops or murmurs.  Abdomen: Bowel sounds are positive,  abdomen soft and non-tender  Msk:  Back normal, normal gait. Normal strength and tone for age. Extremities: No clubbing, cyanosis or edema.   Neuro: Alert and oriented X 3. Psych:  Good affect, responds appropriately  TELEMETRY: Sinus rhythm at 120 bpm  ASSESSMENT AND PLAN: Paroxysmal SVT and ventricular tachycardia. Advise adding digoxin 0.25 mg IV once a day for heart rate is over 100.  Principal Problem:   Wide-complex tachycardia (HCC) Active Problems:   Altered mental status   Acute renal failure (ARF) (HCC)   Ventricular tachyarrhythmia (HCC)    11/07/15 A, MD, [facility] 11/08/2015 8:43 AM

## 2015-11-08 NOTE — Progress Notes (Signed)
NG tube progressed 10cm more, tube moved from 60cm to 70cm.

## 2015-11-08 NOTE — Progress Notes (Addendum)
PULMONARY / CRITICAL CARE MEDICINE   Name: Matthew Foley MRN: [id number] DOB: 1932/11/07    ADMISSION DATE:  2015/12/02 CONSULTATION DATE:  11/06/15  REFERRING MD:  Hospitalist  PT PROFILE: 62 M admitted 05/15 from rehab facility with AMS, AKI, hyperkalemia, AFRVR. A Foley catheter was placed 05/16 with 2300 cc urine immediately drained and significant improvement in renal function. In the evening of 05/17, he developed fever, tachycardia, hypotension and is noted to have a foul smelling pressure ulcer on his sacrum  MAJOR EVENTS/TEST RESULTS: 05/15 admitted as above 05/16 Cardiology and Renal consultations. Foley catheter placed - 2300 cc urine 05/16 Renal US: no hydronephrosis 05/16 CT head: NAD 05/16 TTE: The right ventricular systolic pressure was increased consistent with moderate pulmonary hypertension. Four-chamber dilated patient with severe left ventricle systolic dysfunction with left ventricular ejection fraction 25-30% and severe aortic stenosis and moderate mitral regurgitation 05/17 Developed septic shock - thought due to infected pressure ulcer. PCCM consultation. Vasopressors initiated 05/18 Improved responsiveness, weaning vasopressors, blood cultures positive for MRSA. Goals of care discussion with pt's daughter. Pt made DNR in event of cardiac arrest 05/19 Cognition remained poor. NGT placed for meds and nutrition  INDWELLING DEVICES::   MICRO DATA: MRSA PCR 05/16 >> POS Urine 05/16 >> staph aureus Blood 05/17 >> MRSA  PCT 05/17: 4.93, 16.28, 10.33   ANTIMICROBIALS:  Vanc 05/17 >>  Cefepime 05/17 >>    SUBJECTIVE:  RASS -1, + F/C. Severe rattling cough. No increase in WOB  VITAL SIGNS: BP 117/78 mmHg  Pulse 109  Temp(Src) 99.5 F (37.5 C) (Rectal)  Resp 25  Ht 6' (1.829 m)  Wt 81.647 kg (180 lb)  BMI 24.41 kg/m2  SpO2 100%  HEMODYNAMICS:    VENTILATOR SETTINGS:    INTAKE / OUTPUT: I/O last 3 completed shifts: In: 8385.5  [I.V.:6460.5; Other:125; IV Piggyback:1800] Out: 2725 [Urine:2725]  PHYSICAL EXAMINATION: General: Frail appearing, no respiratory distress, RASS -1 Neuro: PERRL, EOMI, MAEs, DTRs symmetric HEENT: NCAT Cardiovascular: IRIR, + S3, + systolic M Lungs: diffuse rhonchi, rattling cough Abdomen: scaphoid, soft, diminished BS, NT Ext: chronic stasis changes, no LE edema Skin: several pressure ulcers on backside, the one over sacrum is very malodorous  LABS:  BMET  Recent Labs Lab 11/06/15 0748  11/06/15 2042 11/07/15 0321 11/08/15 0348  NA 147*  --   --  144 140  K 2.6*  < > 3.3* 4.4 3.5  CL 118*  --   --  120* 117*  CO2 19*  --   --  18* 18*  BUN 59*  --   --  44* 30*  CREATININE 2.44*  --   --  1.52* 1.09  GLUCOSE 119*  --   --  146* 156*  < > = values in this interval not displayed.  Electrolytes  Recent Labs Lab 11/06/15 0748 11/07/15 0321 11/08/15 0348  CALCIUM 7.5* 7.1* 7.4*  MG 1.9 1.6* 1.5*  PHOS 2.9 2.1* 2.6    CBC  Recent Labs Lab 11/06/15 0300 11/07/15 0321 11/08/15 0348  WBC 20.2* 25.7* 15.4*  HGB 11.2* 10.6* 10.4*  HCT 32.9* 31.7* 31.4*  PLT 141* 91* 55*    Coag's  Recent Labs Lab 2015-12-02 2320  APTT 35  INR 1.38    Sepsis Markers  Recent Labs Lab 2015-12-02 2320 11/05/15 0336 11/06/15 2028 11/07/15 0321 11/08/15 0348  LATICACIDVEN 1.4 1.2  --   --   --   PROCALCITON  --   --  4.93 16.28  10.33    ABG No results for input(s): PHART, PCO2ART, PO2ART in the last 168 hours.  Liver Enzymes  Recent Labs Lab 11/13/2015 2320 11/08/15 0348  AST 28 70*  ALT 31 42  ALKPHOS 69 75  BILITOT 0.8 1.0  ALBUMIN 2.5* 1.4*    Cardiac Enzymes  Recent Labs Lab 11/05/15 0350 11/05/15 1029 11/05/15 1534  TROPONINI 0.62* 3.16* 2.82*    Glucose  Recent Labs Lab 11/05/15 0321  GLUCAP 142*    CXR: no new film    ASSESSMENT / PLAN:  PULMONARY A: Mild acute hypoxic resp failure Possible PNA P:   Continue supplemental  O2 CXR AM 05/19  CARDIOVASCULAR A:  Septic shock Severe cardiomyopathy (LVEF 25%) Aortic stenosis, mitral regurgitation AF with RVR, improved P:  Cont phenylephrine to maintain MAP > 65 mmHg Continue amiodarone Cont low dose metoprolol to maintain HR < 115/min  RENAL A:   Obstructive uropathy, resolved after Foley catheterization Bladder outlet obstruction AKI, nonoliguric - Cr improving Hyperkalemia, resolved Hypokalemia. resolved Hypernatremia, resolved P:   Monitor BMET intermittently Monitor I/Os Correct electrolytes as indicated Renal service following and managing IVFs  GASTROINTESTINAL A:   Chronic PPI use Dysphagia due to AMS Protein-calorie malnutrition P:   SUP: IV PPI  Place NGT for TFs and meds TFs ordered 05/19  HEMATOLOGIC A:   Thrombocytopenia P:  DVT px: heparin gtt stopped 05/17, SQ heparin stopped 05/19 DVT px: SCDs Monitor CBC intermittently Transfuse per usual guidelines  INFECTIOUS A:   Severe sepsis Suspected pressure ulcer infection MRSA bacteremia Probable PNA P:   Monitor temp, WBC count Micro and abx as above  ENDOCRINE A:   Hypothyroidism P:   Cont L-thyroxine as IV until able to take POs  NEUROLOGIC A:   Acute enephalopathy - TME and septic P:   RASS goal: 0 Minimize sedating agents   FAMILY: daughter updated @ bedside   Billy Fischer, MD PCCM service Mobile [phone] Pager [phone]  11/08/2015, 2:03 PM

## 2015-11-08 NOTE — Progress Notes (Signed)
PARENTERAL NUTRITION CONSULT NOTE - FOLLOW UP   Pharmacy Consult for Electrolyte Monitoring Indication: Hypokalemia, Hypomagnesemia  No Known Allergies  Patient Measurements: Height: 6' (182.9 cm) Weight: 180 lb (81.647 kg) IBW/kg (Calculated) : 77.6  Vital Signs: Temp: 100 F (37.8 C) (05/19 0700) Temp Source: Rectal (05/19 0200) BP: 110/91 mmHg (05/19 0700) Pulse Rate: 119 (05/19 0700) Intake/Output from previous day: 05/18 0701 - 05/19 0700 In: 5255.8 [I.V.:4630.8; IV Piggyback:500] Out: 1625 [Urine:1625] Intake/Output from this shift:    Labs:  Recent Labs  11/06/15 0300 11/07/15 0321 11/08/15 0348  WBC 20.2* 25.7* 15.4*  HGB 11.2* 10.6* 10.4*  HCT 32.9* 31.7* 31.4*  PLT 141* 91* 55*     Recent Labs  11/05/15 1331  11/06/15 0748  11/06/15 2042 11/07/15 0321 11/08/15 0348  NA 138  < > 147*  --   --  144 140  K 3.9  < > 2.6*  < > 3.3* 4.4 3.5  CL 110  < > 118*  --   --  120* 117*  CO2 15*  < > 19*  --   --  18* 18*  GLUCOSE 145*  < > 119*  --   --  146* 156*  BUN 114*  < > 59*  --   --  44* 30*  CREATININE 6.11*  < > 2.44*  --   --  1.52* 1.09  CALCIUM 8.0*  < > 7.5*  --   --  7.1* 7.4*  MG  --   < > 1.9  --   --  1.6* 1.5*  PHOS  --   --  2.9  --   --  2.1* 2.6  PROT  --   --   --   --   --   --  4.7*  ALBUMIN  --   --   --   --   --   --  1.4*  AST  --   --   --   --   --   --  70*  ALT  --   --   --   --   --   --  42  ALKPHOS  --   --   --   --   --   --  75  BILITOT  --   --   --   --   --   --  1.0  TRIG 92  --   --   --   --   --   --   CHOLHDL 3.1  --   --   --   --   --   --   CHOL 95  --   --   --   --   --   --   < > = values in this interval not displayed. Estimated Creatinine Clearance: 59.3 mL/min (by C-G formula based on Cr of 1.09).   No results for input(s): GLUCAP in the last 72 hours.  Medical History: Past Medical History  Diagnosis Date  . Depression     h/o psychiatric hospitalisations prior  . Hypertension   .  Hyperlipidemia   . CAD (coronary artery disease)     at least 1 stent  . Hx of pancreatitis   . Rheumatoid arthritis (HCC)   . Hypothyroidism   . GERD (gastroesophageal reflux disease)   . Atrial fibrillation (HCC)     Medications:  Scheduled:  . aspirin EC  81 mg Oral Daily  .  atorvastatin  40 mg Oral q1800  . cefTAZidime (FORTAZ)  IV  2 g Intravenous Q12H  . feeding supplement (VITAL HIGH PROTEIN)  1,000 mL Per Tube Q24H  . heparin subcutaneous  5,000 Units Subcutaneous Q8H  . hydrocortisone sodium succinate  50 mg Intravenous Q12H  . levothyroxine  50 mcg Intravenous Daily  . magnesium sulfate 1 - 4 g bolus IVPB  2 g Intravenous Once  . mupirocin ointment   Nasal BID  . pantoprazole (PROTONIX) IV  40 mg Intravenous Q24H  . potassium chloride  10 mEq Intravenous Q1 Hr x 4  . sodium chloride flush  3 mL Intravenous Q12H  . sodium hypochlorite   Irrigation Daily  . vancomycin  1,000 mg Intravenous Q18H   Infusions:  . 0.45 % NaCl with KCl 20 mEq / L 10 mL (11/08/15 0821)  . amiodarone 30 mg/hr (11/08/15 0600)  . diltiazem (CARDIZEM) infusion      Assessment: 82 y/o M admitted with Vtach from hyperkalemia likely due to urinary obstruction now with hypokalemia, hypermagnesemia, and improving SCr.   All electrolytes within normal limits except Magnesium 1.5   Plan:   Will give Magnesium 2 g IV x 1 and will recheck electrolyte panel with am labs.   Juwan,Latamara Melder D 11/08/2015,8:55 AM

## 2015-11-08 NOTE — Progress Notes (Signed)
Initial Nutrition Assessment  DOCUMENTATION CODES:   Severe malnutrition in context of chronic illness  INTERVENTION:  -Dietitian Consult received for TF via Adult Tube Feeding Protocol. NG tube placed this AM by RN, advanced by 10 cm by RN as recommended by Xray. Recommend changing to Jevity 1.5 at rate of 50 ml/hr with Prostat TID providing 2100 kcals, 122 g of protein, 912 mL of free water. Meets 100% estimated calorie and protein needs. Pt will likely need additional free water flushes as current TF does not meet hydration needs. Continue to assess  NUTRITION DIAGNOSIS:   Malnutrition related to chronic illness as evidenced by severe depletion of body fat, severe depletion of muscle mass.  Being addressed via TF  GOAL:   Patient will meet greater than or equal to 90% of their needs  MONITOR:   PO intake, Diet advancement, Labs, Weight trends, I & O's, Skin  REASON FOR ASSESSMENT:   Rounds Wound healing  ASSESSMENT:   Pt admitted from [facility] with septic shock with pna, acute renal failure, NSTEMI with multiple unstageable pressure ulcers present.  Pt remains lethargic, NG tube inserted for meds and nutrition  Diet Order:  Diet NPO time specified Except for: Ice Chips, Sips with Meds  Skin:   (Multiple Unstageable pressure ulcers)  Last BM:  no BM documented   Labs: phosphorus wdl, magnesium 1.5 (supplemented), potassium wdl  Meds: reviewed  Height:   Ht Readings from Last 1 Encounters:  December 02, 2015 6' (1.829 m)    Weight:   Wt Readings from Last 1 Encounters:  Dec 02, 2015 180 lb (81.647 kg)    BMI:  Body mass index is 24.41 kg/(m^2).  Estimated Nutritional Needs:   Kcal:  2050-2470kcals  Protein:  98-123g protein  Fluid:  >/= 2L fluid  EDUCATION NEEDS:   No education needs identified at this time  Romelle Starcher MS, RD, LDN [phone] Pager  [phone] Weekend/On-Call Pager

## 2015-11-08 NOTE — Progress Notes (Signed)
Subjective:  Lethargic today. Had temp spike T max 100 Not following commands this AM S Cr has improved Potassium has improved    Objective:  Vital signs in last 24 hours:  Temp:  [98.6 F (37 C)-100 F (37.8 C)] 100 F (37.8 C) (05/19 0700) Pulse Rate:  [30-119] 119 (05/19 0700) Resp:  [26-34] 30 (05/19 0700) BP: (102-123)/(67-91) 110/91 mmHg (05/19 0700) SpO2:  [95 %-100 %] 100 % (05/19 0700)  Weight change:  Filed Weights   2015/11/17 2315  Weight: 81.647 kg (180 lb)    Intake/Output:    Intake/Output Summary (Last 24 hours) at 11/08/15 0909 Last data filed at 11/08/15 0600  Gross per 24 hour  Intake 4035.66 ml  Output   1425 ml  Net 2610.66 ml     Physical Exam: General: NAD, laying in bed  HEENT Anicteric, dry mouth  Neck supple  Pulm/lungs Crackles at bases b/l, coarse breath sounds  CVS/Heart Iregular, a fib, tachycardic  Abdomen:  Soft, NT, ND  Extremities: No peripheral edema,    Neurologic: Not following commands  Skin: Heel ulcers, feet deformities b/l          Basic Metabolic Panel:   Recent Labs Lab 11/05/15 1331 11/05/15 2206 11/05/15 2317 11/06/15 0748 11/06/15 1449 11/06/15 2042 11/07/15 0321 11/08/15 0348  NA 138  --  146* 147*  --   --  144 140  K 3.9  --  2.6* 2.6* 3.2* 3.3* 4.4 3.5  CL 110  --  119* 118*  --   --  120* 117*  CO2 15*  --  18* 19*  --   --  18* 18*  GLUCOSE 145*  --  122* 119*  --   --  146* 156*  BUN 114*  --  77* 59*  --   --  44* 30*  CREATININE 6.11*  --  3.33* 2.44*  --   --  1.52* 1.09  CALCIUM 8.0*  --  7.7* 7.5*  --   --  7.1* 7.4*  MG  --  1.5*  --  1.9  --   --  1.6* 1.5*  PHOS  --   --   --  2.9  --   --  2.1* 2.6     CBC:  Recent Labs Lab 11/17/2015 2320 11/05/15 0350 11/06/15 0300 11/07/15 0321 11/08/15 0348  WBC 19.9* 20.4* 20.2* 25.7* 15.4*  HGB 11.8* 10.9* 11.2* 10.6* 10.4*  HCT 35.8* 32.4* 32.9* 31.7* 31.4*  MCV 95.6 95.3 93.8 94.5 95.8  PLT 154 135* 141* 91* 55*       Microbiology:  Recent Results (from the past 720 hour(s))  MRSA PCR Screening     Status: Abnormal   Collection Time: 11/05/15  3:48 AM  Result Value Ref Range Status   MRSA by PCR POSITIVE (A) NEGATIVE Final    Comment:        The GeneXpert MRSA Assay (FDA approved for NASAL specimens only), is one component of a comprehensive MRSA colonization surveillance program. It is not intended to diagnose MRSA infection nor to guide or monitor treatment for MRSA infections. CALLED TO EMMA EGWUATU @ 0515 ON 11/05/2015 BY CAF   Urine culture     Status: Abnormal   Collection Time: 11/05/15 10:00 AM  Result Value Ref Range Status   Specimen Description URINE, CATHETERIZED  Final   Special Requests NONE  Final   Culture (A)  Final    >=100,000 COLONIES/mL METHICILLIN RESISTANT STAPHYLOCOCCUS AUREUS  Report Status 11/07/2015 FINAL  Final   Organism ID, Bacteria METHICILLIN RESISTANT STAPHYLOCOCCUS AUREUS (A)  Final      Susceptibility   Methicillin resistant staphylococcus aureus - MIC*    CIPROFLOXACIN >=8 RESISTANT Resistant     GENTAMICIN <=0.5 SENSITIVE Sensitive     NITROFURANTOIN <=16 SENSITIVE Sensitive     OXACILLIN >=4 RESISTANT Resistant     TETRACYCLINE <=1 SENSITIVE Sensitive     VANCOMYCIN 1 SENSITIVE Sensitive     TRIMETH/SULFA <=10 SENSITIVE Sensitive     CLINDAMYCIN <=0.25 SENSITIVE Sensitive     RIFAMPIN <=0.5 SENSITIVE Sensitive     Inducible Clindamycin NEGATIVE Sensitive     * >=100,000 COLONIES/mL METHICILLIN RESISTANT STAPHYLOCOCCUS AUREUS  Urine culture     Status: None (Preliminary result)   Collection Time: 11/06/15  6:29 PM  Result Value Ref Range Status   Specimen Description URINE, CATHETERIZED  Final   Special Requests Normal  Final   Culture   Final    TOO YOUNG TO READ Performed at [facility]    Report Status PENDING  Incomplete  CULTURE, BLOOD (ROUTINE X 2) w Reflex to ID Panel     Status: Abnormal (Preliminary result)    Collection Time: 11/06/15  8:28 PM  Result Value Ref Range Status   Specimen Description BLOOD LEFT HAND  Final   Special Requests BOTTLES DRAWN AEROBIC AND ANAEROBIC 1CCAERO,3CCANA  Final   Culture  Setup Time   Final    GRAM POSITIVE COCCI AEROBIC BOTTLE ONLY GRAM POSITIVE RODS ANAEROBIC BOTTLE ONLY CRITICAL RESULT CALLED TO, READ BACK BY AND VERIFIED WITH: CHRISTINE KATSOUDAS 11/07/15 1115 MLM GPR PREVIOUSLY CALLED 11/07/15 MLZ     Culture (A)  Final    STAPHYLOCOCCUS AUREUS AEROBIC BOTTLE ONLY GRAM POSITIVE RODS ANAEROBIC BOTTLE ONLY    Report Status PENDING  Incomplete  Blood Culture ID Panel (Reflexed)     Status: Abnormal   Collection Time: 11/06/15  8:28 PM  Result Value Ref Range Status   Enterococcus species NOT DETECTED NOT DETECTED Final   Vancomycin resistance NOT DETECTED NOT DETECTED Final   Listeria monocytogenes NOT DETECTED NOT DETECTED Final   Staphylococcus species DETECTED (A) NOT DETECTED Final    Comment: CRITICAL RESULT CALLED TO, READ BACK BY AND VERIFIED WITH: CHRISTINE KATSOUDAS 11/07/15 1115 MLM    Staphylococcus aureus DETECTED (A) NOT DETECTED Final    Comment: CHRISTINE KATSOUDAS 11/07/15 1115 MLM   Methicillin resistance DETECTED (A) NOT DETECTED Final    Comment: CRITICAL RESULT CALLED TO, READ BACK BY AND VERIFIED WITH: CHRISTINE KATSOUDAS 11/07/15 1115 MLM    Streptococcus species NOT DETECTED NOT DETECTED Final   Streptococcus agalactiae NOT DETECTED NOT DETECTED Final   Streptococcus pneumoniae NOT DETECTED NOT DETECTED Final   Streptococcus pyogenes NOT DETECTED NOT DETECTED Final   Acinetobacter baumannii NOT DETECTED NOT DETECTED Final   Enterobacteriaceae species NOT DETECTED NOT DETECTED Final   Enterobacter cloacae complex NOT DETECTED NOT DETECTED Final   Escherichia coli NOT DETECTED NOT DETECTED Final   Klebsiella oxytoca NOT DETECTED NOT DETECTED Final   Klebsiella pneumoniae NOT DETECTED NOT DETECTED Final   Proteus species NOT  DETECTED NOT DETECTED Final   Serratia marcescens NOT DETECTED NOT DETECTED Final   Carbapenem resistance NOT DETECTED NOT DETECTED Final   Haemophilus influenzae NOT DETECTED NOT DETECTED Final   Neisseria meningitidis NOT DETECTED NOT DETECTED Final   Pseudomonas aeruginosa NOT DETECTED NOT DETECTED Final   Candida albicans NOT DETECTED NOT DETECTED  Final   Candida glabrata NOT DETECTED NOT DETECTED Final   Candida krusei NOT DETECTED NOT DETECTED Final   Candida parapsilosis NOT DETECTED NOT DETECTED Final   Candida tropicalis NOT DETECTED NOT DETECTED Final  CULTURE, BLOOD (ROUTINE X 2) w Reflex to ID Panel     Status: None (Preliminary result)   Collection Time: 11/06/15  8:36 PM  Result Value Ref Range Status   Specimen Description BLOOD RIGHT HAND  Final   Special Requests BOTTLES DRAWN AEROBIC AND ANAEROBIC 3CCAERO,2CCANA  Final   Culture  Setup Time   Final    GRAM POSITIVE RODS CRITICAL RESULT CALLED TO, READ BACK BY AND VERIFIED WITH: MELISSA MACCIA AT 1934 11/07/15 MLZ  CONFIRMED BY MSS ANAEROBIC BOTTLE ONLY    Culture GRAM POSITIVE RODS  Final   Report Status PENDING  Incomplete    Coagulation Studies: No results for input(s): LABPROT, INR in the last 72 hours.  Urinalysis:  Recent Labs  11/05/15 1000  COLORURINE YELLOW*  LABSPEC 1.005  PHURINE 6.0  GLUCOSEU NEGATIVE  HGBUR 1+*  BILIRUBINUR NEGATIVE  KETONESUR NEGATIVE  PROTEINUR NEGATIVE  NITRITE NEGATIVE  LEUKOCYTESUR NEGATIVE      Imaging: Dg Chest Port 1 View  11/08/2015  CLINICAL DATA:  Respiratory failure. EXAM: PORTABLE CHEST 1 VIEW COMPARISON:  11/06/2015 FINDINGS: Normal cardiac silhouette. There is increased perihilar airspace densities which extend into the RIGHT lower lobe and LEFT upper lobe. LEFT basilar atelectasis per No pneumothorax. IMPRESSION: New perihilar airspace densities suggests pulmonary edema. Electronically Signed   By: Genevive Bi M.D.   On: 11/08/2015 07:31   Dg Chest  Port 1 View  11/06/2015  CLINICAL DATA:  Atrial fibrillation and hypertension. EXAM: PORTABLE CHEST 1 VIEW COMPARISON:  Nov 04, 2015 FINDINGS: There is patchy infiltrate in the left base at the level of the left hemidiaphragm. The lungs elsewhere are clear. Heart is upper normal in size with pulmonary vascularity within normal limits. No adenopathy. No bone lesions. IMPRESSION: Patchy infiltrate left base. Lungs elsewhere clear. Stable cardiac silhouette. Electronically Signed   By: Bretta Bang III M.D.   On: 11/06/2015 20:18     Medications:   . 0.45 % NaCl with KCl 20 mEq / L 10 mL (11/08/15 0821)  . amiodarone 30 mg/hr (11/08/15 0600)  . diltiazem (CARDIZEM) infusion     . aspirin EC  81 mg Oral Daily  . atorvastatin  40 mg Oral q1800  . cefTAZidime (FORTAZ)  IV  2 g Intravenous Q12H  . feeding supplement (VITAL HIGH PROTEIN)  1,000 mL Per Tube Q24H  . heparin subcutaneous  5,000 Units Subcutaneous Q8H  . hydrocortisone sodium succinate  50 mg Intravenous Q12H  . levothyroxine  50 mcg Intravenous Daily  . magnesium sulfate 1 - 4 g bolus IVPB  2 g Intravenous Once  . mupirocin ointment   Nasal BID  . pantoprazole (PROTONIX) IV  40 mg Intravenous Q24H  . potassium chloride  10 mEq Intravenous Q1 Hr x 4  . sodium chloride flush  3 mL Intravenous Q12H  . sodium hypochlorite   Irrigation Daily  . vancomycin  1,000 mg Intravenous Q12H   acetaminophen **OR** acetaminophen, metoprolol, ondansetron **OR** ondansetron (ZOFRAN) IV  Assessment/ Plan:  82 y.o.caucasian male with a PMHX of Hypertension, hyperlipidemia, coronary disease, rheumatoid arthritis, hypothyroidism, atrial fibrillation, was admitted on 11/16/2015 with A. fib, RVR requiring cardioversion, acute renal failure with severe hyperkalemia.   1. Acute renal failure. Bedside bladder scan revealed urine  greater than 1000 cc. A Foley was placed, over 2300 cc of urine was obtained immediately Therefore, acute renal failure is  likely secondary to urinary retention Would continue Flomax UOP 1600 S Cr improved to 1.09  2. Severe hyperkalemia, now Hypokalemia Agree with 1/2 NS with Kcl supplements Level has improved  3. Altered mental status likely due to sepsis Blood Cx + GPC Urine Cx MRSA  Will sign off Please call if further assistance is required   LOS: 3 Ulis Kaps 5/19/20179:09 AM

## 2015-11-08 NOTE — Progress Notes (Signed)
PHARMACY - PHYSICIAN COMMUNICATION CRITICAL VALUE ALERT - BLOOD CULTURE IDENTIFICATION (BCID)  Results for orders placed or performed during the hospital encounter of 10/26/2015  Blood Culture ID Panel (Reflexed) (Collected: 11/06/2015  8:28 PM)  Result Value Ref Range   Enterococcus species NOT DETECTED NOT DETECTED   Vancomycin resistance NOT DETECTED NOT DETECTED   Listeria monocytogenes NOT DETECTED NOT DETECTED   Staphylococcus species DETECTED (A) NOT DETECTED   Staphylococcus aureus DETECTED (A) NOT DETECTED   Methicillin resistance DETECTED (A) NOT DETECTED   Streptococcus species NOT DETECTED NOT DETECTED   Streptococcus agalactiae NOT DETECTED NOT DETECTED   Streptococcus pneumoniae NOT DETECTED NOT DETECTED   Streptococcus pyogenes NOT DETECTED NOT DETECTED   Acinetobacter baumannii NOT DETECTED NOT DETECTED   Enterobacteriaceae species NOT DETECTED NOT DETECTED   Enterobacter cloacae complex NOT DETECTED NOT DETECTED   Escherichia coli NOT DETECTED NOT DETECTED   Klebsiella oxytoca NOT DETECTED NOT DETECTED   Klebsiella pneumoniae NOT DETECTED NOT DETECTED   Proteus species NOT DETECTED NOT DETECTED   Serratia marcescens NOT DETECTED NOT DETECTED   Carbapenem resistance NOT DETECTED NOT DETECTED   Haemophilus influenzae NOT DETECTED NOT DETECTED   Neisseria meningitidis NOT DETECTED NOT DETECTED   Pseudomonas aeruginosa NOT DETECTED NOT DETECTED   Candida albicans NOT DETECTED NOT DETECTED   Candida glabrata NOT DETECTED NOT DETECTED   Candida krusei NOT DETECTED NOT DETECTED   Candida parapsilosis NOT DETECTED NOT DETECTED   Candida tropicalis NOT DETECTED NOT DETECTED    Name of physician (or Provider) Contacted: Dr Winona Legato  5/19:  Blood Cx :  GPC in anaerobic bottle.  Pt already on Vanc, MD agreed to continue Vanc.   Deunte Bledsoe D 11/08/2015  8:47 PM

## 2015-11-09 ENCOUNTER — Inpatient Hospital Stay: Payer: Medicare PPO

## 2015-11-09 LAB — URINE CULTURE: SPECIAL REQUESTS: NORMAL

## 2015-11-09 LAB — CBC
HEMATOCRIT: 32.3 % — AB (ref 40.0–52.0)
HEMOGLOBIN: 10.7 g/dL — AB (ref 13.0–18.0)
MCH: 30.8 pg (ref 26.0–34.0)
MCHC: 33.1 g/dL (ref 32.0–36.0)
MCV: 93.2 fL (ref 80.0–100.0)
PLATELETS: 53 10*3/uL — AB (ref 150–440)
RBC: 3.47 MIL/uL — AB (ref 4.40–5.90)
RDW: 14.8 % — ABNORMAL HIGH (ref 11.5–14.5)
WBC: 17.4 10*3/uL — AB (ref 3.8–10.6)

## 2015-11-09 LAB — BASIC METABOLIC PANEL
ANION GAP: 7 (ref 5–15)
BUN: 39 mg/dL — ABNORMAL HIGH (ref 6–20)
CHLORIDE: 116 mmol/L — AB (ref 101–111)
CO2: 18 mmol/L — ABNORMAL LOW (ref 22–32)
Calcium: 7.4 mg/dL — ABNORMAL LOW (ref 8.9–10.3)
Creatinine, Ser: 1.11 mg/dL (ref 0.61–1.24)
GFR calc Af Amer: >60 mL/min (ref >60)
GLUCOSE: 187 mg/dL — AB (ref 65–99)
POTASSIUM: 3.5 mmol/L (ref 3.5–5.1)
Sodium: 141 mmol/L (ref 135–145)

## 2015-11-09 LAB — PHOSPHORUS: Phosphorus: 1.6 mg/dL — ABNORMAL LOW (ref 2.5–4.6)

## 2015-11-09 LAB — MAGNESIUM: Magnesium: 1.7 mg/dL (ref 1.7–2.4)

## 2015-11-09 LAB — VANCOMYCIN, TROUGH: VANCOMYCIN TR: 26 ug/mL — AB (ref 10–20)

## 2015-11-09 MED ORDER — ASPIRIN 81 MG PO CHEW
81.0000 mg | CHEWABLE_TABLET | Freq: Every day | ORAL | Status: DC
  Administered 2015-11-09 – 2015-11-13 (×5): 81 mg via ORAL
  Filled 2015-11-09 (×5): qty 1

## 2015-11-09 MED ORDER — SODIUM PHOSPHATES 45 MMOLE/15ML IV SOLN
20.0000 mmol | Freq: Once | INTRAVENOUS | Status: AC
  Administered 2015-11-09: 20 mmol via INTRAVENOUS
  Filled 2015-11-09: qty 6.67

## 2015-11-09 MED ORDER — VANCOMYCIN HCL IN DEXTROSE 750-5 MG/150ML-% IV SOLN
750.0000 mg | Freq: Two times a day (BID) | INTRAVENOUS | Status: DC
  Administered 2015-11-10 – 2015-11-12 (×6): 750 mg via INTRAVENOUS
  Filled 2015-11-09 (×8): qty 150

## 2015-11-09 MED ORDER — HYDROCORTISONE NA SUCCINATE PF 100 MG IJ SOLR
50.0000 mg | Freq: Every day | INTRAMUSCULAR | Status: DC
  Administered 2015-11-10 – 2015-11-11 (×2): 50 mg via INTRAVENOUS
  Filled 2015-11-09 (×3): qty 1

## 2015-11-09 NOTE — Progress Notes (Signed)
No order found for foley placement , so foley was removed and pt's will be monitor for voiding

## 2015-11-09 NOTE — Progress Notes (Signed)
[employer] - [place] at [facility]   PATIENT NAME: Matthew Foley    MR#:  [id number]  DATE OF BIRTH:  1933-01-22  SUBJECTIVE:   Patient is here sepsis secondary to MRSA UTI and bacteremia. Transferred out of the ICU yesterday. Off vasopressors, and hemodynamically stable. Remains on NG tube feeds.  REVIEW OF SYSTEMS:    Review of Systems  Constitutional: Negative for fever and chills.  HENT: Negative for congestion and tinnitus.   Eyes: Negative for blurred vision and double vision.  Respiratory: Negative for cough, shortness of breath and wheezing.   Cardiovascular: Negative for chest pain, orthopnea and PND.  Gastrointestinal: Negative for nausea, vomiting, abdominal pain and diarrhea.  Genitourinary: Negative for dysuria and hematuria.  Neurological: Negative for dizziness, sensory change and focal weakness.  All other systems reviewed and are negative.   Nutrition: Tube feeds Tolerating Diet: Yes Tolerating PT: Await evaluation   DRUG ALLERGIES:  No Known Allergies  VITALS:  Blood pressure 117/74, pulse 94, temperature 98 F (36.7 C), temperature source Oral, resp. rate 19, height 6' (1.829 m), weight 77.6 kg (171 lb 1.2 oz), SpO2 96 %.  PHYSICAL EXAMINATION:   Physical Exam  GENERAL:  82 y.o.-year-old patient lying in the bed in no acute distress.  EYES: Pupils equal, round, reactive to light and accommodation. No scleral icterus. Extraocular muscles intact.  HEENT: Head atraumatic, normocephalic. Oropharynx and nasopharynx clear.  NECK:  Supple, no jugular venous distention. No thyroid enlargement, no tenderness.  LUNGS: Normal breath sounds bilaterally, no wheezing, rales, rhonchi. No use of accessory muscles of respiration.  CARDIOVASCULAR: S1, S2 normal. No murmurs, rubs, or gallops.  ABDOMEN: Soft, nontender, nondistended. Bowel sounds present. No organomegaly or mass.  EXTREMITIES: No cyanosis, clubbing or edema b/l.    NEUROLOGIC: Cranial  nerves II through XII are intact. No focal Motor or sensory deficits b/l. Globally weak   PSYCHIATRIC: The patient is alert and oriented x 3.  SKIN: No obvious rash, lesion, Stage III Decubitus ulcer.  Signs of PVD b/l   LABORATORY PANEL:   CBC  Recent Labs Lab 11/09/15 0510  WBC 17.4*  HGB 10.7*  HCT 32.3*  PLT 53*   ------------------------------------------------------------------------------------------------------------------  Chemistries   Recent Labs Lab 11/08/15 0348 11/09/15 0510  NA 140 141  K 3.5 3.5  CL 117* 116*  CO2 18* 18*  GLUCOSE 156* 187*  BUN 30* 39*  CREATININE 1.09 1.11  CALCIUM 7.4* 7.4*  MG 1.5* 1.7  AST 70*  --   ALT 42  --   ALKPHOS 75  --   BILITOT 1.0  --    ------------------------------------------------------------------------------------------------------------------  Cardiac Enzymes  Recent Labs Lab 11/05/15 1534  TROPONINI 2.82*   ------------------------------------------------------------------------------------------------------------------  RADIOLOGY:  Dg Abd 1 View  11/08/2015  CLINICAL DATA:  Encounter for nasal/ orogastric tube placement. EXAM: ABDOMEN - 1 VIEW COMPARISON:  CT, 04/25/2014 FINDINGS: Nasal/orogastric tube passes just below the left hemidiaphragm into the proximal stomach. The side hole of the tube projects in the distal esophagus near the GE junction. Recommend further inserting another 10 cm. Normal bowel gas pattern.  Status post cholecystectomy. IMPRESSION: NG tube tip projects in the proximal stomach with side hole in the distal esophagus near the GE junction. Recommend further inserting another 10 cm. Electronically Signed   By: Amie Portland M.D.   On: 11/08/2015 11:44   Dg Chest Port 1 View  11/09/2015  CLINICAL DATA:  Respiratory failure EXAM: PORTABLE CHEST 1 VIEW COMPARISON:  Chest radiograph from one day prior. FINDINGS: Enteric tube enters the stomach with the tip not seen on this image. Stable  cardiomediastinal silhouette with top-normal heart size. No pneumothorax. No pleural effusion. Patchy consolidation in the left parahilar lung and right lung base appears slightly worsened. IMPRESSION: Patchy consolidation in the left parahilar lung and right lung base appears slightly worsened, favor multifocal pneumonia, less likely asymmetric pulmonary edema. Electronically Signed   By: Delbert Phenix M.D.   On: 11/09/2015 10:11   Dg Chest Port 1 View  11/08/2015  CLINICAL DATA:  Respiratory failure. EXAM: PORTABLE CHEST 1 VIEW COMPARISON:  11/06/2015 FINDINGS: Normal cardiac silhouette. There is increased perihilar airspace densities which extend into the RIGHT lower lobe and LEFT upper lobe. LEFT basilar atelectasis per No pneumothorax. IMPRESSION: New perihilar airspace densities suggests pulmonary edema. Electronically Signed   By: Genevive Bi M.D.   On: 11/08/2015 07:31     ASSESSMENT AND PLAN:   82 year old male with past medical history of hypertension, hyperlipidemia, coronary artery disease, rheumatoid arthritis, chronic afibrillation, GERD, depression who presented to the hospital due to altered mental status and noted to be in acute renal failure with hyperkalemia and atrial fibrillation with RVR.  1. Sepsis-secondary to MRSA UTI and bacteremia. -Continue IV vancomycin and patient will likely need treatment for at least 2 weeks. -TTE is negative for any evidence of vegetation. Appreciate infectious disease input.  2. Urinary tract infection-secondary to MRSA. Continue IV vancomycin.  3. Altered mental status-metabolic encephalopathy secondary to the sepsis/UTI. Much improved and back to baseline now.  4. Acute kidney injury-secondary to sepsis and ATN with underlying UTI with urinary retention.. -Foley placed with over 2 L of urine drained. Continue Flomax, continue IV fluids. Creatinine close to baseline.  5. Severe hyperkalemia-secondary to acute renal failure and has  improved his renal function is improved.  6. Dysphagia-secondary to altered mental status and encephalopathy. -Currently on NG tube feeds. Await speech evaluation.  7. Thrombocytopenia-platelets count is stable. No acute bleeding. Heparin induced antibody test has been sent results pending. Follow platelet count.  8. Atrial fibrillation with rapid ventricular response-rates are better controlled today. Continue IV metoprolol as needed.  9. Elevated troponin-likely secondary to demand ischemia from 8 fibrillation with rapid ventricular response underlying sepsis.      All the records are reviewed and case discussed with Care Management/Social Workerr. Management plans discussed with the patient, family and they are in agreement.  CODE STATUS: Partial  DVT Prophylaxis: Teds and SCDs  TOTAL TIME TAKING CARE OF THIS PATIENT: 35 minutes.   POSSIBLE D/C IN 2-3 DAYS, DEPENDING ON CLINICAL CONDITION.   Houston Siren M.D on 11/09/2015 at 2:22 PM  Between 7am to 6pm - Pager - [phone]  After 6pm go to [email or web address] - password [email or web address] [facility]  [place] [facility]  Office  [phone]  CC: Primary care physician; Jaclyn Shaggy, MD

## 2015-11-09 NOTE — Progress Notes (Signed)
PARENTERAL NUTRITION CONSULT NOTE - FOLLOW UP   Pharmacy Consult for Electrolyte Monitoring Indication: Hypokalemia, Hypomagnesemia  No Known Allergies  Patient Measurements: Height: 6' (182.9 cm) Weight: 171 lb 1.2 oz (77.6 kg) IBW/kg (Calculated) : 77.6  Vital Signs: Temp: 97.7 F (36.5 C) (05/20 0700) Temp Source: Oral (05/20 0700) BP: 121/72 mmHg (05/20 0700) Pulse Rate: 106 (05/20 0700) Intake/Output from previous day: 05/19 0701 - 05/20 0700 In: 2048.1 [I.V.:397.5; NG/GT:600.7; IV Piggyback:1050] Out: 1300 [Urine:1300] Intake/Output from this shift:    Labs:  Recent Labs  11/07/15 0321 11/08/15 0348 11/09/15 0510  WBC 25.7* 15.4* 17.4*  HGB 10.6* 10.4* 10.7*  HCT 31.7* 31.4* 32.3*  PLT 91* 55* 53*     Recent Labs  11/07/15 0321 11/08/15 0348 11/09/15 0510  NA 144 140 141  K 4.4 3.5 3.5  CL 120* 117* 116*  CO2 18* 18* 18*  GLUCOSE 146* 156* 187*  BUN 44* 30* 39*  CREATININE 1.52* 1.09 1.11  CALCIUM 7.1* 7.4* 7.4*  MG 1.6* 1.5* 1.7  PHOS 2.1* 2.6 1.6*  PROT  --  4.7*  --   ALBUMIN  --  1.4*  --   AST  --  70*  --   ALT  --  42  --   ALKPHOS  --  75  --   BILITOT  --  1.0  --    Estimated Creatinine Clearance: 58.3 mL/min (by C-G formula based on Cr of 1.11).   No results for input(s): GLUCAP in the last 72 hours.  Medical History: Past Medical History  Diagnosis Date  . Depression     h/o psychiatric hospitalisations prior  . Hypertension   . Hyperlipidemia   . CAD (coronary artery disease)     at least 1 stent  . Hx of pancreatitis   . Rheumatoid arthritis (HCC)   . Hypothyroidism   . GERD (gastroesophageal reflux disease)   . Atrial fibrillation (HCC)     Medications:  Scheduled:  . aspirin EC  81 mg Oral Daily  . atorvastatin  40 mg Oral q1800  . ceFEPime (MAXIPIME) IV  1 g Intravenous Q12H  . digoxin  0.25 mg Intravenous Daily  . feeding supplement (PRO-STAT SUGAR FREE 64)  30 mL Oral TID  . hydrocortisone sodium  succinate  50 mg Intravenous Q12H  . levothyroxine  50 mcg Intravenous Daily  . mupirocin ointment   Nasal BID  . pantoprazole (PROTONIX) IV  40 mg Intravenous Q24H  . sodium chloride flush  3 mL Intravenous Q12H  . sodium hypochlorite   Irrigation Daily  . sodium phosphate  Dextrose 5% IVPB  20 mmol Intravenous Once  . vancomycin  1,000 mg Intravenous Q12H   Infusions:  . 0.45 % NaCl with KCl 20 mEq / L 10 mL (11/08/15 0821)  . amiodarone 30 mg/hr (11/08/15 2229)  . feeding supplement (JEVITY 1.5 CAL/FIBER) 1,000 mL (11/08/15 1900)    Assessment: 82 y/o M admitted with Vtach from hyperkalemia likely due to urinary obstruction now with hypokalemia, hypermagnesemia, and improving SCr.   All electrolytes within normal limits except Phos 1.6.   Plan:  Sodium phosphate 20 mmol iv x 1. Will f/u AM labs.   Luisa Hart D 11/09/2015,7:06 AM

## 2015-11-09 NOTE — Progress Notes (Signed)
Report given to [facility] on 2A

## 2015-11-09 NOTE — Consult Note (Signed)
Pharmacy Antibiotic Note  Matthew Foley is a 82 y.o. male admitted on 11/20/2015 with sepsis. Pharmacy has been consulted for cefepime and vancomycin dosing.  Plan: 05/20 VT: 26. Holding dose.  Will change to vancomycin 750mg  IV q12 hours.  Continue cefepime 1 g IV q12 hours  Will continue to follow renal function and overall clinical picture closely, as patient's renal function has been improving.  Next vanc trough ordered for 1030 on 05/22, prior to 4th new dose.  Height: 6' (182.9 cm) Weight: 171 lb 1.2 oz (77.6 kg) IBW/kg (Calculated) : 77.6  Temp (24hrs), Avg:100.2 F (37.9 C), Min:97.7 F (36.5 C), Max:101.3 F (38.5 C)   Recent Labs Lab 10/22/2015 2320 11/05/15 0336 11/05/15 0350  11/05/15 2317 11/06/15 0300 11/06/15 0748 11/07/15 0321 11/08/15 0348 11/09/15 0510 11/09/15 1058  WBC 19.9*  --  20.4*  --   --  20.2*  --  25.7* 15.4* 17.4*  --   CREATININE 8.60*  --  8.60*  < > 3.33*  --  2.44* 1.52* 1.09 1.11  --   LATICACIDVEN 1.4 1.2  --   --   --   --   --   --   --   --   --   VANCOTROUGH  --   --   --   --   --   --   --   --   --   --  26*  < > = values in this interval not displayed.  Estimated Creatinine Clearance: 58.3 mL/min (by C-G formula based on Cr of 1.11).    No Known Allergies  Antimicrobials this admission: Cefepime 5/19>>  ceftaz 5/17 >> 5/19 vancomycin 5/17 >>   Dose adjustments this admission: Vancomycin 1 g IV q18 hours on 5/18.  Vancomycin 1 g IV q12 hours on 5/19  Vancomycin 750mg  IV q12hrs on 5/20  Microbiology results: 5/17 BCx: STAPHYLOCOCCUS AUREUS  AEROBIC BOTTLE ONLY  GRAM POSITIVE RODS  ANAEROBIC BOTTLE ONLY  5/17 UCx: pending   5/16 MRSA PCR: positive  Thank you for allowing pharmacy to be a part of this patient's care.  6/17, PharmD Pharmacy Resident 11/09/2015

## 2015-11-10 ENCOUNTER — Inpatient Hospital Stay: Payer: Medicare PPO

## 2015-11-10 LAB — CBC
HEMATOCRIT: 29.1 % — AB (ref 40.0–52.0)
HEMOGLOBIN: 9.7 g/dL — AB (ref 13.0–18.0)
MCH: 32.1 pg (ref 26.0–34.0)
MCHC: 33.5 g/dL (ref 32.0–36.0)
MCV: 95.9 fL (ref 80.0–100.0)
Platelets: 56 10*3/uL — ABNORMAL LOW (ref 150–440)
RBC: 3.03 MIL/uL — ABNORMAL LOW (ref 4.40–5.90)
RDW: 14.9 % — AB (ref 11.5–14.5)
WBC: 16.4 10*3/uL — ABNORMAL HIGH (ref 3.8–10.6)

## 2015-11-10 LAB — BASIC METABOLIC PANEL
Anion gap: 5 (ref 5–15)
BUN: 36 mg/dL — ABNORMAL HIGH (ref 6–20)
CO2: 22 mmol/L (ref 22–32)
Calcium: 7 mg/dL — ABNORMAL LOW (ref 8.9–10.3)
Chloride: 114 mmol/L — ABNORMAL HIGH (ref 101–111)
Creatinine, Ser: 1.08 mg/dL (ref 0.61–1.24)
GFR calc Af Amer: >60 mL/min (ref >60)
GFR calc non Af Amer: >60 mL/min (ref >60)
Glucose, Bld: 135 mg/dL — ABNORMAL HIGH (ref 65–99)
Potassium: 2.8 mmol/L — CL (ref 3.5–5.1)
Sodium: 141 mmol/L (ref 135–145)

## 2015-11-10 LAB — POTASSIUM: Potassium: 3.2 mmol/L — ABNORMAL LOW (ref 3.5–5.1)

## 2015-11-10 LAB — MAGNESIUM: Magnesium: 1.6 mg/dL — ABNORMAL LOW (ref 1.7–2.4)

## 2015-11-10 LAB — PHOSPHORUS: PHOSPHORUS: 2.2 mg/dL — AB (ref 2.5–4.6)

## 2015-11-10 MED ORDER — POTASSIUM CHLORIDE 10 MEQ/100ML IV SOLN
10.0000 meq | INTRAVENOUS | Status: AC
  Administered 2015-11-10 (×4): 10 meq via INTRAVENOUS
  Filled 2015-11-10 (×4): qty 100

## 2015-11-10 MED ORDER — PANCRELIPASE (LIP-PROT-AMYL) 12000-38000 UNITS PO CPEP
12000.0000 [IU] | ORAL_CAPSULE | ORAL | Status: AC
  Administered 2015-11-10: 12000 [IU] via ORAL
  Filled 2015-11-10: qty 1

## 2015-11-10 MED ORDER — MAGNESIUM SULFATE 2 GM/50ML IV SOLN
2.0000 g | Freq: Once | INTRAVENOUS | Status: AC
  Administered 2015-11-10: 2 g via INTRAVENOUS
  Filled 2015-11-10: qty 50

## 2015-11-10 MED ORDER — DEXTROSE 5 % IV SOLN
2.0000 g | Freq: Two times a day (BID) | INTRAVENOUS | Status: DC
  Administered 2015-11-10 (×2): 2 g via INTRAVENOUS
  Filled 2015-11-10 (×4): qty 2

## 2015-11-10 MED ORDER — TAMSULOSIN HCL 0.4 MG PO CAPS
0.4000 mg | ORAL_CAPSULE | Freq: Every day | ORAL | Status: DC
  Administered 2015-11-10 – 2015-11-12 (×3): 0.4 mg via ORAL
  Filled 2015-11-10 (×3): qty 1

## 2015-11-10 MED ORDER — DEXTROSE 5 % IV SOLN
20.0000 mmol | Freq: Once | INTRAVENOUS | Status: AC
  Administered 2015-11-10: 20 mmol via INTRAVENOUS
  Filled 2015-11-10: qty 6.67

## 2015-11-10 NOTE — Progress Notes (Signed)
  SUBJECTIVE: Patient is resting in bed with NG tube in place and amiodarone and antibiotic drips in place. He is not very talkative, but denies any chest pain.   Filed Vitals:   11/09/15 2005 11/09/15 2031 11/10/15 0325 11/10/15 1122  BP:   130/69 114/61  Pulse: 130 89 110 105  Temp:   99.5 F (37.5 C) 98.2 F (36.8 C)  TempSrc:   Oral Oral  Resp:   20 20  Height:      Weight:      SpO2:   96% 98%    Intake/Output Summary (Last 24 hours) at 11/10/15 1224 Last data filed at 11/10/15 0934  Gross per 24 hour  Intake 1440.01 ml  Output   1900 ml  Net -459.99 ml    LABS: Basic Metabolic Panel:  Recent Labs  67/20/94 0510 11/10/15 0556  NA 141 141  K 3.5 2.8*  CL 116* 114*  CO2 18* 22  GLUCOSE 187* 135*  BUN 39* 36*  CREATININE 1.11 1.08  CALCIUM 7.4* 7.0*  MG 1.7 1.6*  PHOS 1.6* 2.2*   Liver Function Tests:  Recent Labs  11/08/15 0348  AST 70*  ALT 42  ALKPHOS 75  BILITOT 1.0  PROT 4.7*  ALBUMIN 1.4*   No results for input(s): LIPASE, AMYLASE in the last 72 hours. CBC:  Recent Labs  11/09/15 0510 11/10/15 0556  WBC 17.4* 16.4*  HGB 10.7* 9.7*  HCT 32.3* 29.1*  MCV 93.2 95.9  PLT 53* 56*   Cardiac Enzymes: No results for input(s): CKTOTAL, CKMB, CKMBINDEX, TROPONINI in the last 72 hours. BNP: Invalid input(s): POCBNP D-Dimer: No results for input(s): DDIMER in the last 72 hours. Hemoglobin A1C: No results for input(s): HGBA1C in the last 72 hours. Fasting Lipid Panel: No results for input(s): CHOL, HDL, LDLCALC, TRIG, CHOLHDL, LDLDIRECT in the last 72 hours. Thyroid Function Tests: No results for input(s): TSH, T4TOTAL, T3FREE, THYROIDAB in the last 72 hours.  Invalid input(s): FREET3 Anemia Panel: No results for input(s): VITAMINB12, FOLATE, FERRITIN, TIBC, IRON, RETICCTPCT in the last 72 hours.   PHYSICAL EXAM General: Chronically ill-appearing, in no acute distress HEENT:  Normocephalic and atramatic Neck:  No JVD.  Lungs: Clear  bilaterally to auscultation and percussion. Heart: HR irregular . Normal S1 and S2 without gallops or murmurs.  Abdomen: Bowel sounds are positive, abdomen soft and non-tender  Extremities: Mild lower extremity edema and patient was wearing a compression devices and heel protectors  Neuro: Alert and oriented X 3. Psych:  Patient is sleepy and not very conversant  TELEMETRY: Atrial fibrillation with rates from the 90s to 100s.    ASSESSMENT AND PLAN:  Wide-complex tachycardia cardioverted in emergency department, now in atrial fibrillation with rate controlled on amiodarone drip and as needed metoprolol. Heart rate has been better on IV digoxin. We will continue to follow as the patient improves on treatment for sepsis.  Principal Problem:   Wide-complex tachycardia (HCC) Active Problems:   Altered mental status   Acute renal failure (ARF) (HCC)   Ventricular tachyarrhythmia (HCC)    Berton Bon, NP 11/10/2015 12:24 PM

## 2015-11-10 NOTE — Progress Notes (Signed)
[employer] - [place] at [facility]   PATIENT NAME: Matthew Foley    MR#:  [id number]  DATE OF BIRTH:  [birth year]  SUBJECTIVE:   Patient is here sepsis secondary to MRSA UTI and bacteremia. Remains lethargic but responds to verbal commands.  No acute events overnight.  Remains on Amio gtt for a. Fib.   HR Stable.   REVIEW OF SYSTEMS:    Review of Systems  Constitutional: Negative for fever and chills.  HENT: Negative for congestion and tinnitus.   Eyes: Negative for blurred vision and double vision.  Respiratory: Negative for cough, shortness of breath and wheezing.   Cardiovascular: Negative for chest pain, orthopnea and PND.  Gastrointestinal: Negative for nausea, vomiting, abdominal pain and diarrhea.  Genitourinary: Negative for dysuria and hematuria.  Neurological: Negative for dizziness, sensory change and focal weakness.  All other systems reviewed and are negative.   Nutrition: Tube feeds Tolerating Diet: Yes Tolerating PT: Await evaluation   DRUG ALLERGIES:  No Known Allergies  VITALS:  Blood pressure 114/61, pulse 105, temperature 98.2 F (36.8 C), temperature source Oral, resp. rate 20, height 6' (1.829 m), weight 77.6 kg (171 lb 1.2 oz), SpO2 98 %.  PHYSICAL EXAMINATION:   Physical Exam  GENERAL:  82 y.o.-year-old patient lying in the bed in no acute distress.  EYES: Pupils equal, round, reactive to light and accommodation. No scleral icterus. Extraocular muscles intact.  HEENT: Head atraumatic, normocephalic. Oropharynx and nasopharynx clear. Dry Oral Mucosa.  NG tube in place w/ tube feeds NECK:  Supple, no jugular venous distention. No thyroid enlargement, no tenderness.  LUNGS: Normal breath sounds bilaterally, no wheezing, rales, rhonchi. No use of accessory muscles of respiration.  CARDIOVASCULAR: S1, S2 normal. No murmurs, rubs, or gallops.  ABDOMEN: Soft, nontender, nondistended. Bowel sounds present. No organomegaly or mass.   EXTREMITIES: No cyanosis, clubbing or edema b/l.    NEUROLOGIC: Cranial nerves II through XII are intact. No focal Motor or sensory deficits b/l. Globally weak   PSYCHIATRIC: The patient is alert and oriented x 2.  SKIN: No obvious rash, lesion, Stage III Decubitus ulcer.  Signs of PVD b/l   LABORATORY PANEL:   CBC  Recent Labs Lab 11/10/15 0556  WBC 16.4*  HGB 9.7*  HCT 29.1*  PLT 56*   ------------------------------------------------------------------------------------------------------------------  Chemistries   Recent Labs Lab 11/08/15 0348  11/10/15 0556  NA 140  < > 141  K 3.5  < > 2.8*  CL 117*  < > 114*  CO2 18*  < > 22  GLUCOSE 156*  < > 135*  BUN 30*  < > 36*  CREATININE 1.09  < > 1.08  CALCIUM 7.4*  < > 7.0*  MG 1.5*  < > 1.6*  AST 70*  --   --   ALT 42  --   --   ALKPHOS 75  --   --   BILITOT 1.0  --   --   < > = values in this interval not displayed. ------------------------------------------------------------------------------------------------------------------  Cardiac Enzymes  Recent Labs Lab 11/05/15 1534  TROPONINI 2.82*   ------------------------------------------------------------------------------------------------------------------  RADIOLOGY:  Dg Chest Port 1 View  11/09/2015  CLINICAL DATA:  Respiratory failure EXAM: PORTABLE CHEST 1 VIEW COMPARISON:  Chest radiograph from one day prior. FINDINGS: Enteric tube enters the stomach with the tip not seen on this image. Stable cardiomediastinal silhouette with top-normal heart size. No pneumothorax. No pleural effusion. Patchy consolidation in the left parahilar lung and right lung  base appears slightly worsened. IMPRESSION: Patchy consolidation in the left parahilar lung and right lung base appears slightly worsened, favor multifocal pneumonia, less likely asymmetric pulmonary edema. Electronically Signed   By: Delbert Phenix M.D.   On: 11/09/2015 10:11     ASSESSMENT AND PLAN:    82 year old male with past medical history of hypertension, hyperlipidemia, coronary artery disease, rheumatoid arthritis, chronic afibrillation, GERD, depression who presented to the hospital due to altered mental status and noted to be in acute renal failure with hyperkalemia and atrial fibrillation with RVR.  1. Sepsis-secondary to MRSA UTI and bacteremia. -Continue IV vancomycin and patient will likely need treatment for at least 2 weeks. -TTE is negative for any evidence of vegetation. - Obtained repeat blood cultures to make sure they're clearing. Once cleared patient will need a PICC line and likely long-term IV antibiotics. Appreciate infectious disease input.  2. Urinary tract infection-secondary to MRSA. Continue IV vancomycin.  3. Altered mental status-metabolic encephalopathy secondary to the sepsis/UTI. Much improved and close to baseline now  4. Acute kidney injury-secondary to sepsis and ATN with underlying UTI with urinary retention. - still having some urinary retention. Cont. In and out cath as needed. Cont. Flomax.  - follow BUN/Cr which are stable.  5. Hypokalemia - cont. To supplement and repeat level in a.m.  - check Mg. Level.   6. Dysphagia-secondary to altered mental status and encephalopathy. -Currently on NG tube feeds. Await speech evaluation.  7. Thrombocytopenia-platelets count is stable. No acute bleeding. Heparin induced antibody test has been sent results pending. Follow platelet count.  8. Atrial fibrillation with rapid ventricular response- cont. amio gtt, Metoprolol, Dig.  - rate stable.  Cards following and appreciate input.   9. Elevated troponin-likely secondary to demand ischemia from atrial fibrillation with rapid ventricular response and underlying sepsis. - no plan for any intervention as per Cardiology.   10. Sacral decubitus - Stage III. Cont. Local wound care as per wound team.     All the records are reviewed and case discussed with  Care Management/Social Workerr. Management plans discussed with the patient, family and they are in agreement.  CODE STATUS: Partial  DVT Prophylaxis: Teds and SCDs  TOTAL TIME TAKING CARE OF THIS PATIENT: 30 minutes.   POSSIBLE D/C IN 2-3 DAYS, DEPENDING ON CLINICAL CONDITION.   Houston Siren M.D on 11/10/2015 at 2:08 PM  Between 7am to 6pm - Pager - [phone]  After 6pm go to [email or web address] - password [email or web address] [facility]  [place]  [facility]  Office  [phone]  CC: Primary care physician; Jaclyn Shaggy, MD

## 2015-11-10 NOTE — Consult Note (Signed)
Pharmacy Antibiotic Note  Matthew Foley is a 82 y.o. male admitted on 2015-11-24 with sepsis. Pharmacy has been consulted for cefepime and vancomycin dosing.  Plan: Will continue vancomycin 750mg  IV q12 hours.  Will increase cefepime dosing to 2 g iv q 12 hours.   Next vanc trough ordered for 2230 on 05/22, prior to 4th new dose.  Height: 6' (182.9 cm) Weight: 171 lb 1.2 oz (77.6 kg) IBW/kg (Calculated) : 77.6  Temp (24hrs), Avg:98.8 F (37.1 C), Min:98 F (36.7 C), Max:99.5 F (37.5 C)   Recent Labs Lab 2015/11/24 2320 11/05/15 0336  11/06/15 0300 11/06/15 0748 11/07/15 0321 11/08/15 0348 11/09/15 0510 11/09/15 1058 11/10/15 0556  WBC 19.9*  --   < > 20.2*  --  25.7* 15.4* 17.4*  --  16.4*  CREATININE 8.60*  --   < >  --  2.44* 1.52* 1.09 1.11  --  1.08  LATICACIDVEN 1.4 1.2  --   --   --   --   --   --   --   --   VANCOTROUGH  --   --   --   --   --   --   --   --  26*  --   < > = values in this interval not displayed.  Estimated Creatinine Clearance: 59.9 mL/min (by C-G formula based on Cr of 1.08).    No Known Allergies  Antimicrobials this admission: Cefepime 5/19>>  ceftaz 5/17 >> 5/19 vancomycin 5/17 >>   Dose adjustments this admission: Vancomycin 1 g IV q18 hours on 5/18.  Vancomycin 1 g IV q12 hours on 5/19  Vancomycin 750mg  IV q12hrs on 5/20 Cefepime 2 g iv q12hrs on 5/21  Microbiology results: 5/20 BCx: NGTD x 2 5/18 BCx: S aureus 5/17 BCx: MRSA  5/16 UCx: MRSA   5/16 MRSA PCR: positive  Thank you for allowing pharmacy to be a part of this patient's care.  6/16, PharmD Clinical Pharmacist   11/10/2015

## 2015-11-10 NOTE — Progress Notes (Signed)
Notified Dr. Cherlynn Kaiser that patient still has >400cc on bladder scan and has not had a spontaneous void since foley was removed yesterday. Order to put foley back in.

## 2015-11-10 NOTE — Progress Notes (Signed)
Pt has not voided. Bladder scan showed 733 mL. I/O Cath performed with [place], NT. 650 mL of amber colored urine returned. Pt tolerated procedure well. Will continue to monitor.   Thalia Bloodgood, Orlan Leavens

## 2015-11-10 NOTE — Progress Notes (Signed)
Patient's family called requesting an update on the patient's status. Explained the current plan of care and answered all questions. No further concerns at this time. Nursing staff will continue to monitor. Lamonte Richer, RN

## 2015-11-10 NOTE — Progress Notes (Signed)
Text page sent to Dr. Cherlynn Kaiser regarding low K. Call back from Dr. Cherlynn Kaiser - order for 2g Mg IV and K IV.

## 2015-11-10 NOTE — Progress Notes (Signed)
LCSW reviewed patients notes and likely to return to [employer] [facility] in 2-3 days as per Dr Cherlynn Kaiser. Will forward info  to weekday LCSW.  [employer] LCSW [phone]

## 2015-11-10 NOTE — Progress Notes (Signed)
PARENTERAL NUTRITION CONSULT NOTE - FOLLOW UP   Pharmacy Consult for Electrolyte Monitoring Indication: Hypokalemia, Hypomagnesemia  No Known Allergies  Patient Measurements: Height: 6' (182.9 cm) Weight: 171 lb 1.2 oz (77.6 kg) IBW/kg (Calculated) : 77.6  Vital Signs: Temp: 98.2 F (36.8 C) (05/21 1122) Temp Source: Oral (05/21 1122) BP: 114/61 mmHg (05/21 1122) Pulse Rate: 105 (05/21 1122) Intake/Output from previous day: 05/20 0701 - 05/21 0700 In: 1440 [I.V.:846; NG/GT:544; IV Piggyback:50] Out: 1500 [Urine:1500] Intake/Output from this shift: Total I/O In: -  Out: 875 [Urine:875]  Labs:  Recent Labs  11/08/15 0348 11/09/15 0510 11/10/15 0556  WBC 15.4* 17.4* 16.4*  HGB 10.4* 10.7* 9.7*  HCT 31.4* 32.3* 29.1*  PLT 55* 53* 56*     Recent Labs  11/08/15 0348 11/09/15 0510 11/10/15 0556 11/10/15 1401  NA 140 141 141  --   K 3.5 3.5 2.8* 3.2*  CL 117* 116* 114*  --   CO2 18* 18* 22  --   GLUCOSE 156* 187* 135*  --   BUN 30* 39* 36*  --   CREATININE 1.09 1.11 1.08  --   CALCIUM 7.4* 7.4* 7.0*  --   MG 1.5* 1.7 1.6*  --   PHOS 2.6 1.6* 2.2*  --   PROT 4.7*  --   --   --   ALBUMIN 1.4*  --   --   --   AST 70*  --   --   --   ALT 42  --   --   --   ALKPHOS 75  --   --   --   BILITOT 1.0  --   --   --    Estimated Creatinine Clearance: 59.9 mL/min (by C-G formula based on Cr of 1.08).   No results for input(s): GLUCAP in the last 72 hours.  Medical History: Past Medical History  Diagnosis Date  . Depression     h/o psychiatric hospitalisations prior  . Hypertension   . Hyperlipidemia   . CAD (coronary artery disease)     at least 1 stent  . Hx of pancreatitis   . Rheumatoid arthritis (HCC)   . Hypothyroidism   . GERD (gastroesophageal reflux disease)   . Atrial fibrillation (HCC)     Medications:  Scheduled:  . aspirin  81 mg Oral Daily  . atorvastatin  40 mg Oral q1800  . ceFEPime (MAXIPIME) IV  2 g Intravenous Q12H  . digoxin  0.25  mg Intravenous Daily  . feeding supplement (PRO-STAT SUGAR FREE 64)  30 mL Oral TID  . hydrocortisone sodium succinate  50 mg Intravenous Daily  . levothyroxine  50 mcg Intravenous Daily  . mupirocin ointment   Nasal BID  . pantoprazole (PROTONIX) IV  40 mg Intravenous Q24H  . potassium chloride  10 mEq Intravenous Q1 Hr x 4  . sodium chloride flush  3 mL Intravenous Q12H  . sodium phosphate  Dextrose 5% IVPB  20 mmol Intravenous Once  . tamsulosin  0.4 mg Oral Daily  . vancomycin  750 mg Intravenous Q12H   Infusions:  . 0.45 % NaCl with KCl 20 mEq / L 10 mL (11/08/15 0821)  . amiodarone 30 mg/hr (11/10/15 1333)  . feeding supplement (JEVITY 1.5 CAL/FIBER) 1,000 mL (11/08/15 1900)    Assessment: 82 y/o M admitted with Vtach from hyperkalemia likely due to urinary obstruction now with hypokalemia, hypermagnesemia, and improving SCr.   K= 2.8 Mag= 1.6 Phos= 2.2  Plan:  Mag sulfate 2 g iv and KCl 10 meq runs x 4 ordered per MD.  Will order sodium phosphate 20 mmol iv x 1 and recheck potassium at 1400 as well as all electrolytes in AM.   05/21 PM K is 3.2. Will order an additional 4 runs of KCl 10 meq IV and f/u AM labs.   Luisa Hart D 11/10/2015,3:09 PM

## 2015-11-10 NOTE — Progress Notes (Signed)
Pt's NG tube clogged during lipitor administration despite following hospital policy. Spoke with Dr. Elpidio Anis. Instructed to speak with pharmacy and give the enzyme used to declot a PEG tube (pancrealipase per Pharmacist), and if unsuccessful, replace tube.

## 2015-11-10 NOTE — Progress Notes (Signed)
Pt daughter and grandson updated on pt status. All questions answered.   Mayra Neer M

## 2015-11-10 NOTE — Progress Notes (Signed)
Per Pharmacist, patient's orders for sodium phosphate IV and fluids with KCl are compatible to run together.

## 2015-11-10 NOTE — Progress Notes (Signed)
PARENTERAL NUTRITION CONSULT NOTE - FOLLOW UP   Pharmacy Consult for Electrolyte Monitoring Indication: Hypokalemia, Hypomagnesemia  No Known Allergies  Patient Measurements: Height: 6' (182.9 cm) Weight: 171 lb 1.2 oz (77.6 kg) IBW/kg (Calculated) : 77.6  Vital Signs: Temp: 99.5 F (37.5 C) (05/21 0325) Temp Source: Oral (05/21 0325) BP: 130/69 mmHg (05/21 0325) Pulse Rate: 110 (05/21 0325) Intake/Output from previous day: 05/20 0701 - 05/21 0700 In: 1440 [I.V.:846; NG/GT:544; IV Piggyback:50] Out: 1500 [Urine:1500] Intake/Output from this shift:    Labs:  Recent Labs  11/08/15 0348 11/09/15 0510 11/10/15 0556  WBC 15.4* 17.4* 16.4*  HGB 10.4* 10.7* 9.7*  HCT 31.4* 32.3* 29.1*  PLT 55* 53* 56*     Recent Labs  11/08/15 0348 11/09/15 0510 11/10/15 0556  NA 140 141 141  K 3.5 3.5 2.8*  CL 117* 116* 114*  CO2 18* 18* 22  GLUCOSE 156* 187* 135*  BUN 30* 39* 36*  CREATININE 1.09 1.11 1.08  CALCIUM 7.4* 7.4* 7.0*  MG 1.5* 1.7 1.6*  PHOS 2.6 1.6* 2.2*  PROT 4.7*  --   --   ALBUMIN 1.4*  --   --   AST 70*  --   --   ALT 42  --   --   ALKPHOS 75  --   --   BILITOT 1.0  --   --    Estimated Creatinine Clearance: 59.9 mL/min (by C-G formula based on Cr of 1.08).   No results for input(s): GLUCAP in the last 72 hours.  Medical History: Past Medical History  Diagnosis Date  . Depression     h/o psychiatric hospitalisations prior  . Hypertension   . Hyperlipidemia   . CAD (coronary artery disease)     at least 1 stent  . Hx of pancreatitis   . Rheumatoid arthritis (HCC)   . Hypothyroidism   . GERD (gastroesophageal reflux disease)   . Atrial fibrillation (HCC)     Medications:  Scheduled:  . aspirin  81 mg Oral Daily  . atorvastatin  40 mg Oral q1800  . ceFEPime (MAXIPIME) IV  1 g Intravenous Q12H  . digoxin  0.25 mg Intravenous Daily  . feeding supplement (PRO-STAT SUGAR FREE 64)  30 mL Oral TID  . hydrocortisone sodium succinate  50 mg  Intravenous Daily  . levothyroxine  50 mcg Intravenous Daily  . magnesium sulfate 1 - 4 g bolus IVPB  2 g Intravenous Once  . mupirocin ointment   Nasal BID  . pantoprazole (PROTONIX) IV  40 mg Intravenous Q24H  . potassium chloride  10 mEq Intravenous Q1 Hr x 4  . sodium chloride flush  3 mL Intravenous Q12H  . sodium phosphate  Dextrose 5% IVPB  20 mmol Intravenous Once  . vancomycin  750 mg Intravenous Q12H   Infusions:  . 0.45 % NaCl with KCl 20 mEq / L 10 mL (11/08/15 0821)  . amiodarone 30 mg/hr (11/10/15 0024)  . feeding supplement (JEVITY 1.5 CAL/FIBER) 1,000 mL (11/08/15 1900)    Assessment: 82 y/o M admitted with Vtach from hyperkalemia likely due to urinary obstruction now with hypokalemia, hypermagnesemia, and improving SCr.   K= 2.8 Mag= 1.6 Phos= 2.2   Plan:  Mag sulfate 2 g iv and KCl 10 meq runs x 4 ordered per MD. Patient also receiving IVF with K. Will order sodium phosphate 20 mmol iv x 1 and recheck potassium at 1400 as well as all electrolytes in AM.   Neysa Bonito,  Leaner Morici D 11/10/2015,9:01 AM

## 2015-11-10 NOTE — Progress Notes (Signed)
pancrealipaase was unsuccessful. NG tube placed by Greer Ee RN. Pt tolerated well. Good auscultation post placement. Xray ordered to verify placement before resuming tube feeds.

## 2015-11-10 NOTE — Progress Notes (Signed)
Patient has not voided since in and out cath. Bladder scan volume >500cc. Dr. Cherlynn Kaiser notified - order to in and out cath patient. Will continue to monitor.

## 2015-11-11 DIAGNOSIS — L89159 Pressure ulcer of sacral region, unspecified stage: Secondary | ICD-10-CM

## 2015-11-11 DIAGNOSIS — I350 Nonrheumatic aortic (valve) stenosis: Secondary | ICD-10-CM

## 2015-11-11 DIAGNOSIS — Z515 Encounter for palliative care: Secondary | ICD-10-CM

## 2015-11-11 DIAGNOSIS — I2510 Atherosclerotic heart disease of native coronary artery without angina pectoris: Secondary | ICD-10-CM

## 2015-11-11 DIAGNOSIS — G9341 Metabolic encephalopathy: Secondary | ICD-10-CM

## 2015-11-11 DIAGNOSIS — D649 Anemia, unspecified: Secondary | ICD-10-CM

## 2015-11-11 DIAGNOSIS — R531 Weakness: Secondary | ICD-10-CM

## 2015-11-11 DIAGNOSIS — K219 Gastro-esophageal reflux disease without esophagitis: Secondary | ICD-10-CM

## 2015-11-11 DIAGNOSIS — D696 Thrombocytopenia, unspecified: Secondary | ICD-10-CM

## 2015-11-11 DIAGNOSIS — D72829 Elevated white blood cell count, unspecified: Secondary | ICD-10-CM

## 2015-11-11 DIAGNOSIS — I10 Essential (primary) hypertension: Secondary | ICD-10-CM

## 2015-11-11 LAB — CBC
HCT: 27.5 % — ABNORMAL LOW (ref 40.0–52.0)
HEMOGLOBIN: 9.1 g/dL — AB (ref 13.0–18.0)
MCH: 31.3 pg (ref 26.0–34.0)
MCHC: 33.2 g/dL (ref 32.0–36.0)
MCV: 94.2 fL (ref 80.0–100.0)
Platelets: 62 10*3/uL — ABNORMAL LOW (ref 150–440)
RBC: 2.92 MIL/uL — ABNORMAL LOW (ref 4.40–5.90)
RDW: 15 % — ABNORMAL HIGH (ref 11.5–14.5)
WBC: 13.9 10*3/uL — ABNORMAL HIGH (ref 3.8–10.6)

## 2015-11-11 LAB — CULTURE, BLOOD (ROUTINE X 2)

## 2015-11-11 LAB — BASIC METABOLIC PANEL
Anion gap: 5 (ref 5–15)
BUN: 38 mg/dL — AB (ref 6–20)
CHLORIDE: 112 mmol/L — AB (ref 101–111)
CO2: 23 mmol/L (ref 22–32)
CREATININE: 1.02 mg/dL (ref 0.61–1.24)
Calcium: 7.2 mg/dL — ABNORMAL LOW (ref 8.9–10.3)
GFR calc Af Amer: >60 mL/min (ref >60)
GFR calc non Af Amer: >60 mL/min (ref >60)
Glucose, Bld: 153 mg/dL — ABNORMAL HIGH (ref 65–99)
Potassium: 3.4 mmol/L — ABNORMAL LOW (ref 3.5–5.1)
SODIUM: 140 mmol/L (ref 135–145)

## 2015-11-11 LAB — PHOSPHORUS: Phosphorus: 2.6 mg/dL (ref 2.5–4.6)

## 2015-11-11 LAB — MAGNESIUM: MAGNESIUM: 1.6 mg/dL — AB (ref 1.7–2.4)

## 2015-11-11 MED ORDER — MORPHINE SULFATE (PF) 2 MG/ML IV SOLN
1.0000 mg | INTRAVENOUS | Status: DC | PRN
  Administered 2015-11-11 – 2015-11-12 (×2): 1 mg via INTRAVENOUS
  Filled 2015-11-11 (×3): qty 1

## 2015-11-11 MED ORDER — DEXTROSE 5 % IV SOLN
2.0000 g | Freq: Three times a day (TID) | INTRAVENOUS | Status: DC
  Filled 2015-11-11 (×2): qty 2

## 2015-11-11 MED ORDER — MAGNESIUM SULFATE 4 GM/100ML IV SOLN
4.0000 g | Freq: Once | INTRAVENOUS | Status: AC
  Administered 2015-11-11: 4 g via INTRAVENOUS
  Filled 2015-11-11: qty 100

## 2015-11-11 MED ORDER — POTASSIUM CHLORIDE 10 MEQ/100ML IV SOLN
10.0000 meq | INTRAVENOUS | Status: AC
  Administered 2015-11-11 (×4): 10 meq via INTRAVENOUS
  Filled 2015-11-11 (×4): qty 100

## 2015-11-11 NOTE — Progress Notes (Signed)
PT Cancellation Note  Patient Details Name: Saifan Rayford MRN: [id number] DOB: [birth year]   Cancelled Treatment:    Reason Eval/Treat Not Completed: Medical issues which prohibited therapy. Patient noted to currently be on continuous amioderone infusion. Given difficulty maintaining HR in supine w/o activity PT will hold on mobility evaluation until patient is more stable.   Kerin Ransom, PT, DPT    11/11/2015, 4:53 PM

## 2015-11-11 NOTE — Progress Notes (Deleted)
CSW spoke to [facility]- Admissions Coordinator at [facility] and discussed patient's discharge timeline. Informed [name] that MD Cherlynn Kaiser documented that patient would be ready for discharge in 2-3 days. Requested that [name] began patient's [employer]. CSW will continue to follow and assist.  [name], MSW, LCSW-A Clinical Social Work Department [phone]

## 2015-11-11 NOTE — Clinical Social Work Note (Signed)
Clinical Social Work Assessment  Patient Details  Name: Matthew Foley MRN: [id number] Date of Birth: Aug 13, 1932  Date of referral:  11/05/15               Reason for consult:  Facility Placement                Permission sought to share information with:   (patient disoriented) Permission granted to share information::     Name::        Agency::     Relationship::     Contact Information:     Housing/Transportation Living arrangements for the past 2 months:  [place], [place] of Information:  Patient Patient Interpreter Needed:  None Criminal Activity/Legal Involvement Pertinent to Current Situation/Hospitalization:  No - Comment as needed Significant Relationships:  Adult Children Lives with:  Facility Resident Do you feel safe going back to the place where you live?    Need for family participation in patient care:  No (Coment)  Care giving concerns:  Patient is from [facility]   Social Worker assessment / plan:  CSW was conssutled due to patient coming from [employer]. CSW met with patient and his daughter Matthew Foley at bedside. CSW introduced herself and explained her role. Per patient's daughter she does not want patient to return to [facility]. She stated she'd like for patient to go [place] or [place]. Verbal permission to send SNF referrals to SNFs in [facility].   FL2/ PASRR faxed to SNFs in [facility]. Awaiting bed offers. Patient has [employer] and will need auth. CSW requested MD Sainani to enter a PT Evaluation to be submitted with [employer] authorization. CSW will continue to follow and assist.   Employment status:  Retired Nurse, adult PT Recommendations:    Information / Referral to community resources:  [facility]  Patient/Family's Response to care:  Patient's family is not happy with service received at [facility]. Reported she'd like to evaluate other SNF placements. Stated that  she's pleased with care at [facility] but stated that she does not want patient to be discharged early because she felt that he was discharged too early.   Patient/Family's Understanding of and Emotional Response to Diagnosis, Current Treatment, and Prognosis:  Patient's daughter understands patient's diagnosis and requested SNF placement.   Emotional Assessment Appearance:  Appears stated age Attitude/Demeanor/Rapport:   (None) Affect (typically observed):  Calm, Pleasant Orientation:  Oriented to Self Alcohol / Substance use:  Not Applicable Psych involvement (Current and /or in the community):  No (Comment)  Discharge Needs  Concerns to be addressed:  Discharge Planning Concerns Readmission within the last 30 days:  Yes Current discharge risk:  Chronically ill Barriers to Discharge:  Continued Medical Work up   [employer], LCSW 11/11/2015, 3:57 PM

## 2015-11-11 NOTE — Progress Notes (Signed)
[facility] - [place] [facility]   PATIENT NAME: Matthew Foley    MR#:  [id number]  DATE OF BIRTH:  [birth year]  SUBJECTIVE:   Patient is here sepsis secondary to MRSA UTI and bacteremia. Remains lethargic but responds to verbal commands.  Remains on Amio gtt for a. Fib and rate controlled.  NG tube clogged this a.m but seen by speech and started on full liquid diet and will d/c NG tube.   REVIEW OF SYSTEMS:    Review of Systems  Constitutional: Negative for fever and chills.  HENT: Negative for congestion and tinnitus.   Eyes: Negative for blurred vision and double vision.  Respiratory: Negative for cough, shortness of breath and wheezing.   Cardiovascular: Negative for chest pain, orthopnea and PND.  Gastrointestinal: Negative for nausea, vomiting, abdominal pain and diarrhea.  Genitourinary: Negative for dysuria and hematuria.  Neurological: Negative for dizziness, sensory change and focal weakness.  All other systems reviewed and are negative.   Nutrition: Tube feeds Tolerating Diet: Yes Tolerating PT: Await evaluation   DRUG ALLERGIES:  No Known Allergies  VITALS:  Blood pressure 123/79, pulse 105, temperature 98.1 F (36.7 C), temperature source Oral, resp. rate 21, height 6' (1.829 m), weight 78.12 kg (172 lb 3.6 oz), SpO2 99 %.  PHYSICAL EXAMINATION:   Physical Exam  GENERAL:  82 y.o.-year-old patient lying in the bed in no acute distress.  EYES: Pupils equal, round, reactive to light and accommodation. No scleral icterus. Extraocular muscles intact.  HEENT: Head atraumatic, normocephalic. Oropharynx and nasopharynx clear. Dry Oral Mucosa.  NG tube in place NECK:  Supple, no jugular venous distention. No thyroid enlargement, no tenderness.  LUNGS: Poor respiratory effort, no wheezing, rales, rhonchi. No use of accessory muscles of respiration.  CARDIOVASCULAR: S1, S2 normal. No murmurs, rubs, or gallops.  ABDOMEN: Soft, nontender, nondistended.  Bowel sounds present. No organomegaly or mass.  EXTREMITIES: No cyanosis, clubbing or +1-2 edema b/l.    NEUROLOGIC: Cranial nerves II through XII are intact. No focal Motor or sensory deficits b/l. Globally weak   PSYCHIATRIC: The patient is alert and oriented x 2.  SKIN: No obvious rash, lesion, Stage III Decubitus ulcer.  Signs of PVD b/l   LABORATORY PANEL:   CBC  Recent Labs Lab 11/11/15 0438  WBC 13.9*  HGB 9.1*  HCT 27.5*  PLT 62*   ------------------------------------------------------------------------------------------------------------------  Chemistries   Recent Labs Lab 11/08/15 0348  11/11/15 0438  NA 140  < > 140  K 3.5  < > 3.4*  CL 117*  < > 112*  CO2 18*  < > 23  GLUCOSE 156*  < > 153*  BUN 30*  < > 38*  CREATININE 1.09  < > 1.02  CALCIUM 7.4*  < > 7.2*  MG 1.5*  < > 1.6*  AST 70*  --   --   ALT 42  --   --   ALKPHOS 75  --   --   BILITOT 1.0  --   --   < > = values in this interval not displayed. ------------------------------------------------------------------------------------------------------------------  Cardiac Enzymes  Recent Labs Lab 11/05/15 1534  TROPONINI 2.82*   ------------------------------------------------------------------------------------------------------------------  RADIOLOGY:  Dg Abd Portable 1v  11/10/2015  CLINICAL DATA:  NG tube placement. EXAM: PORTABLE ABDOMEN - 1 VIEW COMPARISON:  Abdomen plain film dated 11/08/2015. FINDINGS: Nasogastric tube tip is in the stomach fundus region, with proximal side holes at the level of the gastroesophageal junction. Visualized bowel gas  pattern is nonobstructive. No evidence of free intraperitoneal air. IMPRESSION: Nasogastric tube tip is in the upper stomach, with proximal side holes at or just below the level of the gastroesophageal junction. Recommend advancing approximately 10 cm for optimal radiographic positioning. Electronically Signed   By: Bary Richard M.D.   On:  11/10/2015 20:34     ASSESSMENT AND PLAN:   82 year old male with past medical history of hypertension, hyperlipidemia, coronary artery disease, rheumatoid arthritis, chronic afibrillation, GERD, depression who presented to the hospital due to altered mental status and noted to be in acute renal failure with hyperkalemia and atrial fibrillation with RVR.  1. Sepsis-secondary to MRSA UTI and bacteremia. -Continue IV vancomycin and patient will likely need treatment for at least 2 weeks. -TTE is negative for any evidence of vegetation. -Repeat blood cultures from 11/09/2015 on negative so far. If remaining negative then we'll get a PICC line placed tomorrow. Appreciate ID input discussed with Dr. Sampson Goon  2. Urinary tract infection-secondary to MRSA. Continue IV vancomycin. -Still having urinary retention therefore Foley had to be replaced yesterday. Continue Flomax for now.  3. Altered mental status-metabolic encephalopathy secondary to the sepsis/UTI. Much improved and close to baseline now  4. Acute kidney injury-secondary to sepsis and ATN with underlying UTI with urinary retention. - continued to have Urinary retention and therefore foley placed again. Cont. Flomax.  - follow BUN/Cr which are stable.  5. Hypokalemia/Hypomagnesemia- will replace accordingly and repeat in a.m.    6. Dysphagia-secondary to altered mental status and encephalopathy. -Seen by speech therapy today and started on a full liquid diet. NG tube discontinued.  7. Thrombocytopenia-platelets count is stable. No acute bleeding. Heparin induced antibody test has been sent results pending. Improving.  8. Atrial fibrillation with rapid ventricular response- cont. amio gtt, Metoprolol, Dig.  - check Dig level in a.m.  - rates stable.  Cards following and appreciate input.   9. Elevated troponin-likely secondary to demand ischemia from atrial fibrillation with rapid ventricular response and underlying sepsis. - no  plan for any intervention as per Cardiology.   10. Sacral decubitus - Stage III. Cont. Local wound care as per wound team.   Palliative Care consult to discuss goals of care with family.  Pt. Is a partial code.  Notified Dr. Orvan Falconer.   All the records are reviewed and case discussed with Care Management/Social Workerr. Management plans discussed with the patient, family and they are in agreement.  CODE STATUS: Partial  DVT Prophylaxis: Teds and SCDs  TOTAL TIME TAKING CARE OF THIS PATIENT: 30 minutes.   POSSIBLE D/C IN 2-3 DAYS, DEPENDING ON CLINICAL CONDITION.   Houston Siren M.D on 11/11/2015 at 3:35 PM  Between 7am to 6pm - Pager - [phone]  After 6pm go to [email or web address] - password [email or web address] [facility]  [facility]  Office  [phone]  CC: Primary care physician; Jaclyn Shaggy, MD

## 2015-11-11 NOTE — Progress Notes (Signed)
Speech Language Pathology Treatment: Dysphagia  Patient Details Name: Matthew Foley MRN: [id number] DOB: [birth year] Today's Date: 11/11/2015 Time: 1300-1400 SLP Time Calculation (min) (ACUTE ONLY): 60 min  Assessment / Plan / Recommendation Clinical Impression     HPI  Pt experienced sepsis secondary to MRSA UTI and bacteremia. He remains drowsy but responds to verbal commands and questions today. NG tube was placed for nutrition/hydration and meds when medical status declined. In CCU. Since then, he transferred to the floor but today, the NG clogged this am and NSG is unable to use it. Pt appeared to tolerate trials of single cie chips and Nectar liquids by tsp, puree by tsp. No overt coughing was noted; no decline in respiratory status or change in vocal quality noted. Pt was instructed on aspiration precautions and was encouraged to use f/u, multiple swallows to clear any potential pharyngeal residue. Pt was easily fatigued w/ the exertion of po trials; rest breaks are recommended when needed.  NSG and MD consulted. MD agreed w/ pulling the NG and SLP will initiate a Full Liquid diet of Nectar consistency at this time w/ trials to upgrade when appropriate. Aspiration precautions; Meds in Puree - Crushed. Feeding assistance at meals. Family updated.        SLP Plan  Continue with current plan of care     Recommendations  Diet recommendations: Nectar-thick liquid (Full Liquid) Liquids provided via: Cup;Teaspoon Medication Administration: Crushed with puree Supervision: Staff to assist with self feeding;Full supervision/cueing for compensatory strategies Compensations: Minimize environmental distractions;Slow rate;Small sips/bites;Multiple dry swallows after each bite/sip;Follow solids with liquid Postural Changes and/or Swallow Maneuvers: Seated upright 90 degrees             General recommendations:  (Dietician) Oral Care Recommendations: Oral care BID;Staff/trained  caregiver to provide oral care Follow up Recommendations: Skilled Nursing facility (SNF) Plan: Continue with current plan of care     GO               Jerilynn Som, MS, CCC-SLP  Andra Matsuo 11/11/2015, 3:46 PM

## 2015-11-11 NOTE — Progress Notes (Signed)
PARENTERAL NUTRITION CONSULT NOTE - FOLLOW UP   Pharmacy Consult for Electrolyte Monitoring Indication: Hypokalemia, Hypomagnesemia  No Known Allergies  Patient Measurements: Height: 6' (182.9 cm) Weight: 172 lb 3.6 oz (78.12 kg) IBW/kg (Calculated) : 77.6  Vital Signs: Temp: 98 F (36.7 C) (05/22 0419) Temp Source: Oral (05/22 0419) BP: 115/56 mmHg (05/22 0419) Pulse Rate: 97 (05/22 0419) Intake/Output from previous day: 05/21 0701 - 05/22 0700 In: 500 [IV Piggyback:500] Out: 1350 [Urine:1350] Intake/Output from this shift:    Labs:  Recent Labs  11/09/15 0510 11/10/15 0556 11/11/15 0438  WBC 17.4* 16.4* 13.9*  HGB 10.7* 9.7* 9.1*  HCT 32.3* 29.1* 27.5*  PLT 53* 56* 62*     Recent Labs  11/09/15 0510 11/10/15 0556 11/10/15 1401 11/11/15 0438  NA 141 141  --  140  K 3.5 2.8* 3.2* 3.4*  CL 116* 114*  --  112*  CO2 18* 22  --  23  GLUCOSE 187* 135*  --  153*  BUN 39* 36*  --  38*  CREATININE 1.11 1.08  --  1.02  CALCIUM 7.4* 7.0*  --  7.2*  MG 1.7 1.6*  --  1.6*  PHOS 1.6* 2.2*  --  2.6   Estimated Creatinine Clearance: 63.4 mL/min (by C-G formula based on Cr of 1.02).   No results for input(s): GLUCAP in the last 72 hours.  Medical History: Past Medical History  Diagnosis Date  . Depression     h/o psychiatric hospitalisations prior  . Hypertension   . Hyperlipidemia   . CAD (coronary artery disease)     at least 1 stent  . Hx of pancreatitis   . Rheumatoid arthritis (HCC)   . Hypothyroidism   . GERD (gastroesophageal reflux disease)   . Atrial fibrillation (HCC)     Medications:  Scheduled:  . aspirin  81 mg Oral Daily  . atorvastatin  40 mg Oral q1800  . ceFEPime (MAXIPIME) IV  2 g Intravenous Q12H  . digoxin  0.25 mg Intravenous Daily  . feeding supplement (PRO-STAT SUGAR FREE 64)  30 mL Oral TID  . hydrocortisone sodium succinate  50 mg Intravenous Daily  . levothyroxine  50 mcg Intravenous Daily  . magnesium sulfate 1 - 4 g  bolus IVPB  4 g Intravenous Once  . mupirocin ointment   Nasal BID  . pantoprazole (PROTONIX) IV  40 mg Intravenous Q24H  . potassium chloride  10 mEq Intravenous Q1 Hr x 4  . sodium chloride flush  3 mL Intravenous Q12H  . tamsulosin  0.4 mg Oral Daily  . vancomycin  750 mg Intravenous Q12H   Infusions:  . 0.45 % NaCl with KCl 20 mEq / L 10 mL (11/08/15 0821)  . amiodarone 30 mg/hr (11/11/15 0100)  . feeding supplement (JEVITY 1.5 CAL/FIBER) 1,000 mL (11/10/15 2330)    Assessment: 82 y/o M admitted with Vtach from hyperkalemia likely due to urinary obstruction now with hypokalemia, hypermagnesemia, and improving SCr.   K= 3.4, Mag= 1.6 Phos= 2.6  Plan:  Patient's magnesium continues to remain low despite supplementation on 5/21. Will order magnesium sulfate 4 gm IV once.  Will also supplement potassium with KCl 10 meq IV x 4 as potassium remains low.  Will recheck BMP/mag/phos in AM.    Jerrid Forgette G 11/11/2015,7:29 AM

## 2015-11-11 NOTE — Progress Notes (Signed)
SUBJECTIVE: Patient is still alert and oriented 0   Filed Vitals:   11/10/15 1122 11/10/15 1500 11/10/15 1918 11/11/15 0419  BP: 114/61  115/69 115/56  Pulse: 105  71 97  Temp: 98.2 F (36.8 C)  97.5 F (36.4 C) 98 F (36.7 C)  TempSrc: Oral  Oral Oral  Resp: 20  18 26   Height:      Weight:  173 lb 6.4 oz (78.654 kg)  172 lb 3.6 oz (78.12 kg)  SpO2: 98%  99% 98%    Intake/Output Summary (Last 24 hours) at 11/11/15 0858 Last data filed at 11/10/15 1841  Gross per 24 hour  Intake    500 ml  Output    875 ml  Net   -375 ml    LABS: Basic Metabolic Panel:  Recent Labs  11/12/15 0556 11/10/15 1401 11/11/15 0438  NA 141  --  140  K 2.8* 3.2* 3.4*  CL 114*  --  112*  CO2 22  --  23  GLUCOSE 135*  --  153*  BUN 36*  --  38*  CREATININE 1.08  --  1.02  CALCIUM 7.0*  --  7.2*  MG 1.6*  --  1.6*  PHOS 2.2*  --  2.6   Liver Function Tests: No results for input(s): AST, ALT, ALKPHOS, BILITOT, PROT, ALBUMIN in the last 72 hours. No results for input(s): LIPASE, AMYLASE in the last 72 hours. CBC:  Recent Labs  11/10/15 0556 11/11/15 0438  WBC 16.4* 13.9*  HGB 9.7* 9.1*  HCT 29.1* 27.5*  MCV 95.9 94.2  PLT 56* 62*   Cardiac Enzymes: No results for input(s): CKTOTAL, CKMB, CKMBINDEX, TROPONINI in the last 72 hours. BNP: Invalid input(s): POCBNP D-Dimer: No results for input(s): DDIMER in the last 72 hours. Hemoglobin A1C: No results for input(s): HGBA1C in the last 72 hours. Fasting Lipid Panel: No results for input(s): CHOL, HDL, LDLCALC, TRIG, CHOLHDL, LDLDIRECT in the last 72 hours. Thyroid Function Tests: No results for input(s): TSH, T4TOTAL, T3FREE, THYROIDAB in the last 72 hours.  Invalid input(s): FREET3 Anemia Panel: No results for input(s): VITAMINB12, FOLATE, FERRITIN, TIBC, IRON, RETICCTPCT in the last 72 hours.   PHYSICAL EXAM General: Well developed, well nourished, in no acute distress HEENT:  Normocephalic and atramatic Neck:  No JVD.   Lungs: Clear bilaterally to auscultation and percussion. Heart: HRRR . Normal S1 and S2 without gallops or murmurs.  Abdomen: Bowel sounds are positive, abdomen soft and non-tender  Msk:  Back normal, normal gait. Normal strength and tone for age. Extremities: No clubbing, cyanosis or edema.   Neuro: Alert and oriented X 3. Psych:  Good affect, responds appropriately  TELEMETRY: It appears patient is in A. fib with controlled ventricular response rate about 87 bpm  ASSESSMENT AND PLAN: A. fib with controlled ventricular response rate status post hypokalemia and ventricular tachycardia. Right now the A. fib is rate controlled.  Principal Problem:   Wide-complex tachycardia (HCC) Active Problems:   Altered mental status   Acute renal failure (ARF) (HCC)   Ventricular tachyarrhythmia (HCC)    11/13/15 A, MD, [facility] 11/11/2015 8:58 AM

## 2015-11-11 NOTE — Progress Notes (Signed)
Dressings to sacral and right hip changed per standing order. Old dressings removed had moist drainage. Pt tolerates the dressing change. I will continue to assess.

## 2015-11-11 NOTE — Consult Note (Addendum)
Palliative Medicine Inpatient Consult Note   Name: Matthew Foley Date: 11/11/2015 MRN: [id number]  DOB: 05/28/1933  Referring Physician: Houston Siren, MD  Palliative Care consult requested for this 82 y.o. male for goals of medical therapy in patient with sepsis.  DISCUSSIONS AND PLAN: 1.  Pt is limited code and the only thing permitted is antiarrythmics during a code.    This is something worth discussing, but not as urgent as the plan of care in the near term.   2.  Pt was felt to be possibly appropriate for terminal comfort care. However, I have learned that he is eating (when fed--he has to be fed every bite he takes in) ---and he is eating about 40% of a very texture-restricted diet.  He is getting Dysphagia 1 with Nectar thick liquids.  BUT, he takes it in if someone is PATIENTLY FEEDING HIM.  I also learned that he is cooperative with PT--though very weak. Given his prior level of functioning, which was much more independent, a trial with PT would be appropriate.  3.  I called the daughter who claims to be HCPOA, Denise. She has two sisters she keeps in touch with and all three make decisions together, but she says she is the main contact and primary decision maker.   4.  After talking with daughter, she wishes for pt to go ahead and go back to [facility] for rehab -but she would like a PALLIATIVE CARE CONSULT --since pt is not expected to survive longer than 6 months and even though he may bounce back a bit with rehab, he still has a VERY HIGH RISK OF RE-INFECTION from the decubitus wound and/ or from dysphagia and/or UTI from chronic foley.  I advised [name] that pt could transition over to being a HOSPICE pt at the facility without having to come back to the hospital and the PALLIATIVE CARE provider can help facilitate this.     5. Note that pt had the decubiti on admission --but this was likely a development of his recent rhabdo.  Sacral decubitus probably will never heal due to  severely low albumin and albumin losses through large wounds.  Recommend debridement be considered for his sacral wound --if he is stable enough at the facility.  Wound needs protection from incontinent urine.    5. XLK:GMWNU the foley in--and send pt for urology follow up.   He has a huge decubitus and he has severe retention.  He has two approved reasons for a foley.  Urologist said to leave it in also. Pt does NOT need Flomax with a foley in place.  When foley is removed, pt will need Flomax back (but he might not urinate w/o a TUR as prostate is enlarged and he was not able to urinate at all when foley was removed --and   6.  He does have pain at times. Not sure where or characteristics.  I am changing him over to [facility] --not for terminal type care, but b/c he has a dysphagia, and hopefully, placing this under his tongue won't add to all the challenges he has with swallowing.     7.  Apparently, all meds have to be crushed or be in liquid form or else given by non-oral routes.         -----------------------------------------------------------------------------  CLINICAL NARRATIVE: Pt is an 82 yo man with chronic illnesses who was admitted due to nausea, vomiting, more confusion, and increased tremors to arms.  He was found to  be in acute renal failure in the ED and his potassium was high.  He had a wide complex tachycardia with a rate of 170.  He was shocked and cardioverted in the ED by ED physician and later was started on an Amiodarone drip.  He was given IV calcium chloride and oral Kayexalate in the ED for the hyperkalemia. Since then, he has been made a 'limited code' with antiarrhythmics allowed --perhaps b/c they worked in this instance to help him.  He had been hospitalized here very recently with acute metabolic encephalopathy from renal failure and rhabdomyolsysi.  He has AFib and is not on anticoagulation, CAD, a history of major depression, HTN, and DJD. He was previous to  this, living alone in his home.  He had been found lying on the floor for about 2 days where he had lain after a fall. He had had some hematemesis and has a known h/o GERD. This resolved on its own.  He was noted then to be severely malnourished.  His BPH was treated with Flomax. He was discharged to [facility] for a rehab stay, but he was only there for four days when he was re-admitted here.   Nephrology felt the acute renal failure was likely due to urinary retention and Flomax is to continue.  A foley resulted tin 2300 cc obtained immediately. He later retained as much as 1 liter so a foley was placed.   Cardiology was consulted but felt that his  AFib was related to huis abnormal potassium labs .  Amiodarone continued for a time.  He had some elevated troponins and had been on a heparin drip for chest pain/ acs, but this was stopped. He is normally followed for his cardiac disease by Dr. Lady Gary.   His phosphorus was critically low and he has had other metabolic derangements addressed.  He had to have NG feedings due to very poor intake, but this was stopped and he has been given a diet by SLP, although it is very texture-restricted to prevent aspiration,which he is subject to.  He is now eating as much as 40% ONLY WHEN FED EVERY BITE AND SIP.    He had a fever of 102 on 5/17 and had blood cxs. He was found to have MRSA baceteremia and Clostridial Bactermia with clinical sepsis. He was treated with fluids and pressors and with ABX and has improved. Pressors were DCd.  He is currently getting Vanco until June 5th after which time the PICC line can be DCd. No need for ID follow up.  Augmentin is also to stop on June 5th.   He has an unstageable  sacral decubitus ulcer which will be unlikely to heal given his comorbid conditions.  He also has pressure injury to right hip and right shoulder.  These were present at adm.  Sacral ulcer is 7 cm by 5 cm with 100% slough with foul odor. It covers his sacrum and upper  buttocks.  Right hip decub is 4 cm by 2 cm with adherent slough.  Right shoulder is 1 cm by 1 cm. These are likely related to his prior fall and time lying on the floor for two days prior to his most recent admission (and due to rhabdo).    Pt had a rec. From Urology to 'continue foley for now' --but apparently no order had been placed, so the foley got removed on 5/20.  Pt did not pick up and start putting out urine on his own and they  had to do in and out caths to get any urine out. Eventually, the foley was placed again (with an order). Actually, given pts severe urinary retention and his very large sacral decubitus, he has TWO reasons to keep a foley in place. He can follow up as an outpt about the obstruction, but in fact, the decubitus will necessitate a foley until this is better or resolved (and it is unlikely to heal completeley if at all given his severe malnutrition found present at this admission.)  ----------------------------------------------------  ACTIVE PROBLEMS: Septic shock  --due to infected decubiti Weakness Metabolic Encephalopathy Sacral Decubitus HTN HLD CAD with at least 1 stent Afib hypthyroidism Rheumatoid Arthritis GERD Dysphagia H/O depression (with prior psych hosp stays) H/O pancreatitis Wide Complex tachycardia --resolved Recent Acute Renal Failure --Cr now down to 0.93.   Anemia of chronic disease Severe Malnutirition ---with albumin low at 1.4 Thrombocytopenia --worse recently Leukocytosis Aortic Stenosis ---listed as severe on echo report Severe Cardiomyopathy with LVEF at 25% --echo 5/16 (previous echo on 5/9 showed EF of 45% so this was acute change) Pulmonary HTN] Paroxysmal SVT and VT --digoxin added to regimen when HR is greater than 100  ----------------------------------------------   REVIEW OF SYSTEMS:  Patient is not able to provide ROS due to illness.   SPIRITUAL SUPPORT SYSTEM: Yes.  SOCIAL HISTORY:  reports that he has  quit smoking. He has quit using smokeless tobacco. He reports that he does not drink alcohol or use illicit drugs.  Pt was resident at [facility] and he can return there at time of discharge.  Daughter is [name] [phone].    LEGAL DOCUMENTS:  None  CODE STATUS: Limited code --only antiarrhythmic drugs are permitted   PAST MEDICAL HISTORY: Past Medical History  Diagnosis Date  . Depression     h/o psychiatric hospitalisations prior  . Hypertension   . Hyperlipidemia   . CAD (coronary artery disease)     at least 1 stent  . Hx of pancreatitis   . Rheumatoid arthritis (HCC)   . Hypothyroidism   . GERD (gastroesophageal reflux disease)   . Atrial fibrillation (HCC)     PAST SURGICAL HISTORY:  Past Surgical History  Procedure Laterality Date  . Knee replacement Bilateral   . Cholecystectomy    . Hernia repair    . Toe amputation      Right, 2nd toe  . Joint replacement      ALLERGIES:  has No Known Allergies.  MEDICATIONS:  Current Facility-Administered Medications  Medication Dose Route Frequency Provider Last Rate Last Dose  . 0.45 % NaCl with KCl 20 mEq / L infusion   Intravenous Continuous Merwyn Katos, MD 10 mL/hr at 11/08/15 0821 10 mL at 11/08/15 0821  . acetaminophen (TYLENOL) tablet 650 mg  650 mg Oral Q6H PRN Ihor Austin, MD   650 mg at 11/08/15 2002   Or  . acetaminophen (TYLENOL) suppository 650 mg  650 mg Rectal Q6H PRN Ihor Austin, MD   650 mg at 11/06/15 1802  . amiodarone (NEXTERONE PREMIX) 360-4.14 MG/200ML-% (1.8 mg/mL) IV infusion  30 mg/hr Intravenous Continuous Pavan Pyreddy, MD 16.7 mL/hr at 11/11/15 0100 30 mg/hr at 11/11/15 0100  . aspirin chewable tablet 81 mg  81 mg Oral Daily Cy Blamer, RPH   81 mg at 11/11/15 0820  . atorvastatin (LIPITOR) tablet 40 mg  40 mg Oral q1800 Adrian Saran, MD   40 mg at 11/11/15 1748  . digoxin (LANOXIN) 0.25 MG/ML injection  0.25 mg  0.25 mg Intravenous Daily Laurier Nancy, MD   0.25 mg at 11/11/15  1610  . feeding supplement (JEVITY 1.5 CAL/FIBER) liquid 1,000 mL  1,000 mL Per Tube Continuous Merwyn Katos, MD 50 mL/hr at 11/10/15 2330 1,000 mL at 11/10/15 2330  . feeding supplement (PRO-STAT SUGAR FREE 64) liquid 30 mL  30 mL Oral TID Merwyn Katos, MD   30 mL at 11/11/15 1750  . hydrocortisone sodium succinate (SOLU-CORTEF) 100 MG injection 50 mg  50 mg Intravenous Daily Houston Siren, MD   50 mg at 11/11/15 9604  . levothyroxine (SYNTHROID, LEVOTHROID) injection 50 mcg  50 mcg Intravenous Daily Merwyn Katos, MD   50 mcg at 11/11/15 0820  . metoprolol (LOPRESSOR) injection 2.5-5 mg  2.5-5 mg Intravenous Q3H PRN Merwyn Katos, MD   5 mg at 11/09/15 0441  . morphine 2 MG/ML injection 1 mg  1 mg Intravenous Q4H PRN Houston Siren, MD      . mupirocin ointment (BACTROBAN) 2 %   Nasal BID Ihor Austin, MD      . ondansetron (ZOFRAN) tablet 4 mg  4 mg Oral Q6H PRN Ihor Austin, MD       Or  . ondansetron (ZOFRAN) injection 4 mg  4 mg Intravenous Q6H PRN Pavan Pyreddy, MD      . pantoprazole (PROTONIX) injection 40 mg  40 mg Intravenous Q24H Merwyn Katos, MD   40 mg at 11/10/15 2043  . sodium chloride flush (NS) 0.9 % injection 3 mL  3 mL Intravenous Q12H Pavan Pyreddy, MD   3 mL at 11/11/15 1000  . tamsulosin (FLOMAX) capsule 0.4 mg  0.4 mg Oral Daily Houston Siren, MD   0.4 mg at 11/11/15 0820  . vancomycin (VANCOCIN) IVPB 750 mg/150 ml premix  750 mg Intravenous Q12H Cy Blamer, RPH   750 mg at 11/11/15 1256    Vital Signs: BP 123/79 mmHg  Pulse 105  Temp(Src) 98.1 F (36.7 C) (Oral)  Resp 21  Ht 6' (1.829 m)  Wt 78.12 kg (172 lb 3.6 oz)  BMI 23.35 kg/m2  SpO2 99% Filed Weights   11/09/15 0455 11/10/15 1500 11/11/15 0419  Weight: 77.6 kg (171 lb 1.2 oz) 78.654 kg (173 lb 6.4 oz) 78.12 kg (172 lb 3.6 oz)    Estimated body mass index is 23.35 kg/(m^2) as calculated from the following:   Height as of this encounter: 6' (1.829 m).   Weight as of this  encounter: 78.12 kg (172 lb 3.6 oz).  PERFORMANCE STATUS (ECOG) : 4 - Bedbound  PHYSICAL EXAM: NAD Seen twice today --once with eyes open and speaking some Now with eyes closed  Resp 24 breaths per minute No JVD or Tm Hrt rrr with irreg beats Lungs cta  Abd soft and NT Cachectic Heels elevated ------------------------------------------------------  LABS: CBC:    Component Value Date/Time   WBC 13.9* 11/11/2015 0438   WBC 9.4 02/06/2014 1206   HGB 9.1* 11/11/2015 0438   HGB 11.8* 02/06/2014 1206   HCT 27.5* 11/11/2015 0438   HCT 35.0* 02/06/2014 1206   PLT 62* 11/11/2015 0438   PLT 283 02/06/2014 1206   MCV 94.2 11/11/2015 0438   MCV 94 02/06/2014 1206   NEUTROABS 15.4* 10/28/2015 1022   NEUTROABS 7.8* 02/06/2014 1206   LYMPHSABS 0.9* 10/28/2015 1022   LYMPHSABS 0.7* 02/06/2014 1206   MONOABS 1.2* 10/28/2015 1022   MONOABS 0.7 02/06/2014 1206  EOSABS 0.0 10/28/2015 1022   EOSABS 0.2 02/06/2014 1206   BASOSABS 0.0 10/28/2015 1022   BASOSABS 0.1 02/06/2014 1206   Comprehensive Metabolic Panel:    Component Value Date/Time   NA 140 11/11/2015 0438   NA 137 02/06/2014 1206   K 3.4* 11/11/2015 0438   K 4.1 02/06/2014 1206   CL 112* 11/11/2015 0438   CL 102 02/06/2014 1206   CO2 23 11/11/2015 0438   CO2 26 02/06/2014 1206   BUN 38* 11/11/2015 0438   BUN 11 02/06/2014 1206   CREATININE 1.02 11/11/2015 0438   CREATININE 1.24 02/06/2014 1206   GLUCOSE 153* 11/11/2015 0438   GLUCOSE 90 02/06/2014 1206   CALCIUM 7.2* 11/11/2015 0438   CALCIUM 8.0* 02/06/2014 1206   AST 70* 11/08/2015 0348   AST 28 01/21/2014 1320   ALT 42 11/08/2015 0348   ALT 16 01/21/2014 1320   ALKPHOS 75 11/08/2015 0348   ALKPHOS 78 01/21/2014 1320   BILITOT 1.0 11/08/2015 0348   BILITOT 0.8 01/21/2014 1320   PROT 4.7* 11/08/2015 0348   PROT 7.5 01/21/2014 1320   ALBUMIN 1.4* 11/08/2015 0348   ALBUMIN 3.1* 01/21/2014 1320   Echo 11/05/15: - The right ventricular systolic pressure was  increased consistent  with moderate pulmonary hypertension. Four-chamber dilated  patient with severe left ventricle systolic dysfunction with left  ventricular ejection fraction 25-30% and severe aortic stenosis  and moderate mitral regurgitation.   More than 50% of the visit was spent in counseling/coordination of care: Yes  Time Spent: 80 minutes

## 2015-11-11 NOTE — Progress Notes (Deleted)
CSW updated [name]- Admissions Coordinator at [facility] that patient was admitted onto 2A. He reports patient can return to [facility] at discharge. CSW will continue to follow and assist.   [name], MSW, LCSW-A Clinical Social Work Department [phone]

## 2015-11-11 NOTE — Consult Note (Signed)
Pharmacy Antibiotic Note  Matthew Foley is a 82 y.o. male admitted on 11/17/2015 with sepsis. Pharmacy has been consulted for cefepime and vancomycin dosing.  Plan: Will continue vancomycin 750mg  IV q12 hours.  Will increase cefepime dosing to 2 g iv q 8 hours based on indication and renal function (est CrCl~63.4 mL/min).  Next vanc trough ordered for 2230 on 05/22, prior to 4th new dose.  Height: 6' (182.9 cm) Weight: 172 lb 3.6 oz (78.12 kg) IBW/kg (Calculated) : 77.6  Temp (24hrs), Avg:97.9 F (36.6 C), Min:97.5 F (36.4 C), Max:98.1 F (36.7 C)   Recent Labs Lab 11/17/2015 2320 11/05/15 0336  11/07/15 0321 11/08/15 0348 11/09/15 0510 11/09/15 1058 11/10/15 0556 11/11/15 0438  WBC 19.9*  --   < > 25.7* 15.4* 17.4*  --  16.4* 13.9*  CREATININE 8.60*  --   < > 1.52* 1.09 1.11  --  1.08 1.02  LATICACIDVEN 1.4 1.2  --   --   --   --   --   --   --   VANCOTROUGH  --   --   --   --   --   --  26*  --   --   < > = values in this interval not displayed.  Estimated Creatinine Clearance: 63.4 mL/min (by C-G formula based on Cr of 1.02).    No Known Allergies  Antimicrobials this admission: Cefepime 5/19>>  ceftaz 5/17 >> 5/19 vancomycin 5/17 >>   Dose adjustments this admission: Vancomycin 1 g IV q18 hours on 5/18.  Vancomycin 1 g IV q12 hours on 5/19  Vancomycin 750mg  IV q12hrs on 5/20 Cefepime 2 g iv q12hrs on 5/21  Microbiology results: 5/20 BCx: NGTD x 2 5/18 BCx: S aureus 5/17 BCx: MRSA  5/16 UCx: MRSA   5/16 MRSA PCR: positive  Thank you for allowing pharmacy to be a part of this patient's care.  6/16, PharmD Clinical Pharmacist 11/11/2015

## 2015-11-11 NOTE — Progress Notes (Signed)
NG discontinued per policy. Pt tolerated the procedure. I will continue to assess.

## 2015-11-11 NOTE — Progress Notes (Signed)
Pt complains of pain. No prn orders. MD notified. ORder received. I will continue to assess.

## 2015-11-11 NOTE — Progress Notes (Signed)
Nutrition Follow-up  DOCUMENTATION CODES:   Severe malnutrition in context of chronic illness  INTERVENTION:  -Discussed poc with Natalia Leatherwood SLP, pt passed bedside swallow with plan for FL, Nectar Thick diet with removal of NG at present. Diet advancement as tolerated -Recommend addition of Mighty Shakes, Magic Cup for added nutrition -Recommend further intervention regarding bowel regimen as pt with no documented BM since admission  NUTRITION DIAGNOSIS:   Malnutrition related to chronic illness as evidenced by severe depletion of body fat, severe depletion of muscle mass.  Being addressed via diet advancement, adding supplements  GOAL:   Patient will meet greater than or equal to 90% of their needs  MONITOR:   PO intake, Diet advancement, Labs, Weight trends, I & O's, Skin  REASON FOR ASSESSMENT:   Rounds Wound healing  ASSESSMENT:   Pt admitted from [facility] with septic shock with pna, acute renal failure, NSTEMI with multiple unstageable pressure ulcers present.  TF on hold due to NG clogged (14 french NG); noted NG clogged yesterday during administration of lipitor. RN attempted to unclog but unable to NG replaced yesterday, now clogged again. SLP planning to advanced diet, NG to be removed. Previously tolerating Jevity 1.5 at rate of 50 ml/hr  Diet Order:  Diet NPO time specified  Skin:   (Multiple Unstageable pressure ulcers)  Last BM:  no BM documented   Labs: potassium 3.4, magnesium 1.6, phosphorus wdl  Meds: mag sulfate, potassium chloride  Height:   Ht Readings from Last 1 Encounters:  10/21/2015 6' (1.829 m)    Weight: weight relatively stable  Wt Readings from Last 1 Encounters:  11/11/15 172 lb 3.6 oz (78.12 kg)    Filed Weights   11/09/15 0455 11/10/15 1500 11/11/15 0419  Weight: 171 lb 1.2 oz (77.6 kg) 173 lb 6.4 oz (78.654 kg) 172 lb 3.6 oz (78.12 kg)    BMI:  Body mass index is 23.35 kg/(m^2).  Estimated Nutritional Needs:    Kcal:  2050-2470kcals  Protein:  98-123g protein  Fluid:  >/= 2L fluid  EDUCATION NEEDS:   No education needs identified at this time  Romelle Starcher MS, RD, LDN [phone] Pager  [phone] Weekend/On-Call Pager

## 2015-11-11 NOTE — Progress Notes (Signed)
[facility] CLINIC INFECTIOUS DISEASE PROGRESS NOTE Date of Admission:  12-04-15     ID: Matthew Foley is a 82 y.o. male with MRSA bacteremia, ARF due to urinary retention with MRSA UTI, sacral decubitus ulcer  Principal Problem:   Wide-complex tachycardia (HCC) Active Problems:   Altered mental status   Acute renal failure (ARF) (HCC)   Ventricular tachyarrhythmia (HCC)   Subjective: Remains lethargic, no real fevers Out of unit  ROS  Unable to obtain  Medications:  Antibiotics Given (last 72 hours)    Date/Time Action Medication Dose Rate   11/08/15 2134 Given   ceFEPIme (MAXIPIME) 1 g in dextrose 5 % 50 mL IVPB 1 g 100 mL/hr   11/08/15 2304 Given   vancomycin (VANCOCIN) IVPB 1000 mg/200 mL premix 1,000 mg 200 mL/hr   11/09/15 9480 Given   ceFEPIme (MAXIPIME) 1 g in dextrose 5 % 50 mL IVPB 1 g 100 mL/hr   11/09/15 2244 Given   ceFEPIme (MAXIPIME) 1 g in dextrose 5 % 50 mL IVPB 1 g 100 mL/hr   11/10/15 0130 Given   vancomycin (VANCOCIN) IVPB 750 mg/150 ml premix 750 mg 150 mL/hr   11/10/15 1030 Given   ceFEPIme (MAXIPIME) 2 g in dextrose 5 % 50 mL IVPB 2 g 100 mL/hr   11/10/15 1228 Given   vancomycin (VANCOCIN) IVPB 750 mg/150 ml premix 750 mg 150 mL/hr   11/10/15 2156 Given   ceFEPIme (MAXIPIME) 2 g in dextrose 5 % 50 mL IVPB 2 g 100 mL/hr   11/10/15 2230 Given   vancomycin (VANCOCIN) IVPB 750 mg/150 ml premix 750 mg 150 mL/hr   11/11/15 1256 Given   vancomycin (VANCOCIN) IVPB 750 mg/150 ml premix 750 mg 150 mL/hr     . aspirin  81 mg Oral Daily  . atorvastatin  40 mg Oral q1800  . digoxin  0.25 mg Intravenous Daily  . feeding supplement (PRO-STAT SUGAR FREE 64)  30 mL Oral TID  . hydrocortisone sodium succinate  50 mg Intravenous Daily  . levothyroxine  50 mcg Intravenous Daily  . magnesium sulfate 1 - 4 g bolus IVPB  4 g Intravenous Once  . mupirocin ointment   Nasal BID  . pantoprazole (PROTONIX) IV  40 mg Intravenous Q24H  . sodium chloride flush  3 mL  Intravenous Q12H  . tamsulosin  0.4 mg Oral Daily  . vancomycin  750 mg Intravenous Q12H    Objective: Vital signs in last 24 hours: Temp:  [97.5 F (36.4 C)-98.1 F (36.7 C)] 98.1 F (36.7 C) (05/22 1139) Pulse Rate:  [71-105] 105 (05/22 1139) Resp:  [18-26] 21 (05/22 1139) BP: (115-123)/(56-79) 123/79 mmHg (05/22 1139) SpO2:  [98 %-99 %] 99 % (05/22 1139) Weight:  [78.12 kg (172 lb 3.6 oz)] 78.12 kg (172 lb 3.6 oz) (05/22 0419) Constitutional: frail, thin chronciallyu ill appearing HENT: PERRLA anicteric Mouth/Throat: Oropharynx is clear and dry. No oropharyngeal exudate.  Cardiovascular:Tachy No murmur heard.  Pulmonary/Chest: Effort normal and breath sounds normal. No respiratory distress. He has no wheezes.  Abdominal: Soft. Bowel sounds are normal. He exhibits no distension. There is no tenderness.  Lymphadenopathy: He has no cervical adenopathy.  Neurological: lethargic Skin: R shoulder decub with dry eschar, sacral ulcers  Psychiatric: lethargic  Lab Results  Recent Labs  11/10/15 0556 11/10/15 1401 11/11/15 0438  WBC 16.4*  --  13.9*  HGB 9.7*  --  9.1*  HCT 29.1*  --  27.5*  NA 141  --  140  K 2.8* 3.2* 3.4*  CL 114*  --  112*  CO2 22  --  23  BUN 36*  --  38*  CREATININE 1.08  --  1.02    Microbiology: Results for orders placed or performed during the hospital encounter of Nov 09, 2015  MRSA PCR Screening     Status: Abnormal   Collection Time: 11/05/15  3:48 AM  Result Value Ref Range Status   MRSA by PCR POSITIVE (A) NEGATIVE Final    Comment:        The GeneXpert MRSA Assay (FDA approved for NASAL specimens only), is one component of a comprehensive MRSA colonization surveillance program. It is not intended to diagnose MRSA infection nor to guide or monitor treatment for MRSA infections. CALLED TO EMMA EGWUATU @ 0515 ON 11/05/2015 BY CAF   Urine culture     Status: Abnormal   Collection Time: 11/05/15 10:00 AM  Result Value Ref Range Status    Specimen Description URINE, CATHETERIZED  Final   Special Requests NONE  Final   Culture (A)  Final    >=100,000 COLONIES/mL METHICILLIN RESISTANT STAPHYLOCOCCUS AUREUS   Report Status 11/07/2015 FINAL  Final   Organism ID, Bacteria METHICILLIN RESISTANT STAPHYLOCOCCUS AUREUS (A)  Final      Susceptibility   Methicillin resistant staphylococcus aureus - MIC*    CIPROFLOXACIN >=8 RESISTANT Resistant     GENTAMICIN <=0.5 SENSITIVE Sensitive     NITROFURANTOIN <=16 SENSITIVE Sensitive     OXACILLIN >=4 RESISTANT Resistant     TETRACYCLINE <=1 SENSITIVE Sensitive     VANCOMYCIN 1 SENSITIVE Sensitive     TRIMETH/SULFA <=10 SENSITIVE Sensitive     CLINDAMYCIN <=0.25 SENSITIVE Sensitive     RIFAMPIN <=0.5 SENSITIVE Sensitive     Inducible Clindamycin NEGATIVE Sensitive     * >=100,000 COLONIES/mL METHICILLIN RESISTANT STAPHYLOCOCCUS AUREUS  Urine culture     Status: Abnormal   Collection Time: 11/06/15  6:29 PM  Result Value Ref Range Status   Specimen Description URINE, CATHETERIZED  Final   Special Requests Normal  Final   Culture (A)  Final    >=100,000 COLONIES/mL METHICILLIN RESISTANT STAPHYLOCOCCUS AUREUS   Report Status 11/09/2015 FINAL  Final   Organism ID, Bacteria METHICILLIN RESISTANT STAPHYLOCOCCUS AUREUS (A)  Final      Susceptibility   Methicillin resistant staphylococcus aureus - MIC*    CIPROFLOXACIN >=8 RESISTANT Resistant     GENTAMICIN <=0.5 SENSITIVE Sensitive     NITROFURANTOIN <=16 SENSITIVE Sensitive     OXACILLIN >=4 RESISTANT Resistant     TETRACYCLINE <=1 SENSITIVE Sensitive     VANCOMYCIN 1 SENSITIVE Sensitive     TRIMETH/SULFA <=10 SENSITIVE Sensitive     CLINDAMYCIN <=0.25 SENSITIVE Sensitive     RIFAMPIN <=0.5 SENSITIVE Sensitive     Inducible Clindamycin NEGATIVE Sensitive     * >=100,000 COLONIES/mL METHICILLIN RESISTANT STAPHYLOCOCCUS AUREUS  CULTURE, BLOOD (ROUTINE X 2) w Reflex to ID Panel     Status: Abnormal (Preliminary result)   Collection  Time: 11/06/15  8:28 PM  Result Value Ref Range Status   Specimen Description BLOOD LEFT HAND  Final   Special Requests BOTTLES DRAWN AEROBIC AND ANAEROBIC 1CCAERO,3CCANA  Final   Culture  Setup Time   Final    GRAM POSITIVE COCCI AEROBIC BOTTLE ONLY GRAM POSITIVE RODS ANAEROBIC BOTTLE ONLY CRITICAL RESULT CALLED TO, READ BACK BY AND VERIFIED WITH: CHRISTINE KATSOUDAS 11/07/15 1115 MLM GPR PREVIOUSLY CALLED 11/07/15 MLZ     Culture (  A)  Final    METHICILLIN RESISTANT STAPHYLOCOCCUS AUREUS AEROBIC BOTTLE ONLY GRAM POSITIVE RODS ANAEROBIC BOTTLE ONLY    Report Status PENDING  Incomplete   Organism ID, Bacteria METHICILLIN RESISTANT STAPHYLOCOCCUS AUREUS  Final      Susceptibility   Methicillin resistant staphylococcus aureus - MIC*    CIPROFLOXACIN 4 RESISTANT Resistant     ERYTHROMYCIN <=0.25 SENSITIVE Sensitive     GENTAMICIN <=0.5 SENSITIVE Sensitive     OXACILLIN >=4 RESISTANT Resistant     TETRACYCLINE <=1 SENSITIVE Sensitive     VANCOMYCIN 1 SENSITIVE Sensitive     TRIMETH/SULFA <=10 SENSITIVE Sensitive     CLINDAMYCIN <=0.25 SENSITIVE Sensitive     RIFAMPIN <=0.5 SENSITIVE Sensitive     Inducible Clindamycin NEGATIVE Sensitive     * METHICILLIN RESISTANT STAPHYLOCOCCUS AUREUS  Blood Culture ID Panel (Reflexed)     Status: Abnormal   Collection Time: 11/06/15  8:28 PM  Result Value Ref Range Status   Enterococcus species NOT DETECTED NOT DETECTED Final   Vancomycin resistance NOT DETECTED NOT DETECTED Final   Listeria monocytogenes NOT DETECTED NOT DETECTED Final   Staphylococcus species DETECTED (A) NOT DETECTED Final    Comment: CRITICAL RESULT CALLED TO, READ BACK BY AND VERIFIED WITH: CHRISTINE KATSOUDAS 11/07/15 1115 MLM    Staphylococcus aureus DETECTED (A) NOT DETECTED Final    Comment: CHRISTINE KATSOUDAS 11/07/15 1115 MLM   Methicillin resistance DETECTED (A) NOT DETECTED Final    Comment: CRITICAL RESULT CALLED TO, READ BACK BY AND VERIFIED WITH: CHRISTINE  KATSOUDAS 11/07/15 1115 MLM    Streptococcus species NOT DETECTED NOT DETECTED Final   Streptococcus agalactiae NOT DETECTED NOT DETECTED Final   Streptococcus pneumoniae NOT DETECTED NOT DETECTED Final   Streptococcus pyogenes NOT DETECTED NOT DETECTED Final   Acinetobacter baumannii NOT DETECTED NOT DETECTED Final   Enterobacteriaceae species NOT DETECTED NOT DETECTED Final   Enterobacter cloacae complex NOT DETECTED NOT DETECTED Final   Escherichia coli NOT DETECTED NOT DETECTED Final   Klebsiella oxytoca NOT DETECTED NOT DETECTED Final   Klebsiella pneumoniae NOT DETECTED NOT DETECTED Final   Proteus species NOT DETECTED NOT DETECTED Final   Serratia marcescens NOT DETECTED NOT DETECTED Final   Carbapenem resistance NOT DETECTED NOT DETECTED Final   Haemophilus influenzae NOT DETECTED NOT DETECTED Final   Neisseria meningitidis NOT DETECTED NOT DETECTED Final   Pseudomonas aeruginosa NOT DETECTED NOT DETECTED Final   Candida albicans NOT DETECTED NOT DETECTED Final   Candida glabrata NOT DETECTED NOT DETECTED Final   Candida krusei NOT DETECTED NOT DETECTED Final   Candida parapsilosis NOT DETECTED NOT DETECTED Final   Candida tropicalis NOT DETECTED NOT DETECTED Final  CULTURE, BLOOD (ROUTINE X 2) w Reflex to ID Panel     Status: None (Preliminary result)   Collection Time: 11/06/15  8:36 PM  Result Value Ref Range Status   Specimen Description BLOOD RIGHT HAND  Final   Special Requests BOTTLES DRAWN AEROBIC AND ANAEROBIC 3CCAERO,2CCANA  Final   Culture  Setup Time   Final    GRAM POSITIVE RODS ANAEROBIC BOTTLE ONLY CRITICAL RESULT CALLED TO, READ BACK BY AND VERIFIED WITH: MELISSA MACCIA AT 1934 11/07/15 MLZ  CONFIRMED BY MSS GRAM POSITIVE COCCI AEROBIC BOTTLE ONLY CRITICAL VALUE NOTED.  VALUE IS CONSISTENT WITH PREVIOUSLY REPORTED AND CALLED VALUE.    Culture   Final    GRAM POSITIVE RODS ANAEROBIC BOTTLE ONLY METHICILLIN RESISTANT STAPHYLOCOCCUS AUREUS AEROBIC BOTTLE  ONLY HOLDING FOR POSSIBLE ANAEROBE  Report Status PENDING  Incomplete   Organism ID, Bacteria METHICILLIN RESISTANT STAPHYLOCOCCUS AUREUS  Final      Susceptibility   Methicillin resistant staphylococcus aureus - MIC*    CIPROFLOXACIN 4 RESISTANT Resistant     ERYTHROMYCIN <=0.25 SENSITIVE Sensitive     GENTAMICIN <=0.5 SENSITIVE Sensitive     OXACILLIN >=4 RESISTANT Resistant     TETRACYCLINE <=1 SENSITIVE Sensitive     VANCOMYCIN 1 SENSITIVE Sensitive     TRIMETH/SULFA <=10 SENSITIVE Sensitive     CLINDAMYCIN <=0.25 SENSITIVE Sensitive     RIFAMPIN <=0.5 SENSITIVE Sensitive     Inducible Clindamycin NEGATIVE Sensitive     * METHICILLIN RESISTANT STAPHYLOCOCCUS AUREUS  Culture, blood (Routine X 2) w Reflex to ID Panel     Status: Abnormal   Collection Time: 11/07/15  9:42 PM  Result Value Ref Range Status   Specimen Description BLOOD LEFT HAND  Final   Special Requests BOTTLES DRAWN AEROBIC AND ANAEROBIC  Final   Culture  Setup Time   Final    GRAM POSITIVE COCCI IN BOTH AEROBIC AND ANAEROBIC BOTTLES CRITICAL RESULT CALLED TO, READ BACK BY AND VERIFIED WITH: JASON ROBBINS AT 2037 11/08/2015 BY TFK CONFIRMED BY MLZ    Culture (A)  Final    METHICILLIN RESISTANT STAPHYLOCOCCUS AUREUS IN BOTH AEROBIC AND ANAEROBIC BOTTLES    Report Status 11/11/2015 FINAL  Final   Organism ID, Bacteria METHICILLIN RESISTANT STAPHYLOCOCCUS AUREUS  Final      Susceptibility   Methicillin resistant staphylococcus aureus - MIC*    CIPROFLOXACIN 4 RESISTANT Resistant     ERYTHROMYCIN <=0.25 SENSITIVE Sensitive     GENTAMICIN <=0.5 SENSITIVE Sensitive     OXACILLIN >=4 RESISTANT Resistant     TETRACYCLINE <=1 SENSITIVE Sensitive     VANCOMYCIN 1 SENSITIVE Sensitive     TRIMETH/SULFA <=10 SENSITIVE Sensitive     CLINDAMYCIN <=0.25 SENSITIVE Sensitive     RIFAMPIN <=0.5 SENSITIVE Sensitive     Inducible Clindamycin NEGATIVE Sensitive     * METHICILLIN RESISTANT STAPHYLOCOCCUS AUREUS   CULTURE, BLOOD (ROUTINE X 2) w Reflex to ID Panel     Status: None (Preliminary result)   Collection Time: 11/09/15  1:05 PM  Result Value Ref Range Status   Specimen Description BLOOD LEFT ARM  Final   Special Requests BOTTLES DRAWN AEROBIC AND ANAEROBIC 7CC  Final   Culture NO GROWTH < 24 HOURS  Final   Report Status PENDING  Incomplete  CULTURE, BLOOD (ROUTINE X 2) w Reflex to ID Panel     Status: None (Preliminary result)   Collection Time: 11/09/15  1:05 PM  Result Value Ref Range Status   Specimen Description BLOOD RIGHT HAND  Final   Special Requests BOTTLES DRAWN AEROBIC AND ANAEROBIC 7CC  Final   Culture NO GROWTH < 24 HOURS  Final   Report Status PENDING  Incomplete    Studies/Results: Dg Abd Portable 1v  11/10/2015  CLINICAL DATA:  NG tube placement. EXAM: PORTABLE ABDOMEN - 1 VIEW COMPARISON:  Abdomen plain film dated 11/08/2015. FINDINGS: Nasogastric tube tip is in the stomach fundus region, with proximal side holes at the level of the gastroesophageal junction. Visualized bowel gas pattern is nonobstructive. No evidence of free intraperitoneal air. IMPRESSION: Nasogastric tube tip is in the upper stomach, with proximal side holes at or just below the level of the gastroesophageal junction. Recommend advancing approximately 10 cm for optimal radiographic positioning. Electronically Signed   By: Anne Ng.D.  On: 11/10/2015 20:34    Assessment/Plan: Matthew Foley is a 82 y.o. male with Staph aurues bacteremia, likely from UTI with acute urinary retention. He was recently admitted 5/8-11 following a fall with confusion Rhabdo, AKD. It does not appear cultures were done at that time although did have wbc 17 at admit 5/8. He was afebrile throughout that admission and his wbc went down to 10 with no antibiotics. Currently he is in poor condition, has a sacral decub. Some concern for PNA as well Repeat bcx 5/18 were + FU 5/20 pending.   Recommendations Continue  vancomycin He has had a TTE which is neg for vegetations but is a poor candidate for  TEE  If bcx 5/20 neg can place picc tomorrow WIll need minimum 2 weeks of IV vanco for the MRSA bacteremia Thank you very much for the consult. Will follow with you.  FITZGERALD, DAVID P   11/11/2015, 3:13 PM

## 2015-11-12 ENCOUNTER — Inpatient Hospital Stay: Payer: Medicare PPO

## 2015-11-12 LAB — BASIC METABOLIC PANEL
Anion gap: 4 — ABNORMAL LOW (ref 5–15)
BUN: 33 mg/dL — AB (ref 6–20)
CALCIUM: 7.4 mg/dL — AB (ref 8.9–10.3)
CHLORIDE: 110 mmol/L (ref 101–111)
CO2: 26 mmol/L (ref 22–32)
CREATININE: 0.93 mg/dL (ref 0.61–1.24)
GFR calc non Af Amer: >60 mL/min (ref >60)
Glucose, Bld: 103 mg/dL — ABNORMAL HIGH (ref 65–99)
Potassium: 3.3 mmol/L — ABNORMAL LOW (ref 3.5–5.1)
SODIUM: 140 mmol/L (ref 135–145)

## 2015-11-12 LAB — CBC
HCT: 27.8 % — ABNORMAL LOW (ref 40.0–52.0)
HEMOGLOBIN: 9.1 g/dL — AB (ref 13.0–18.0)
MCH: 31.1 pg (ref 26.0–34.0)
MCHC: 32.8 g/dL (ref 32.0–36.0)
MCV: 94.9 fL (ref 80.0–100.0)
Platelets: 101 10*3/uL — ABNORMAL LOW (ref 150–440)
RBC: 2.93 MIL/uL — ABNORMAL LOW (ref 4.40–5.90)
RDW: 15.1 % — AB (ref 11.5–14.5)
WBC: 14.1 10*3/uL — ABNORMAL HIGH (ref 3.8–10.6)

## 2015-11-12 LAB — CULTURE, BLOOD (ROUTINE X 2)

## 2015-11-12 LAB — PHOSPHORUS: PHOSPHORUS: 2.8 mg/dL (ref 2.5–4.6)

## 2015-11-12 LAB — DIGOXIN LEVEL: DIGOXIN LVL: 1.5 ng/mL (ref 0.8–2.0)

## 2015-11-12 LAB — VANCOMYCIN, TROUGH: VANCOMYCIN TR: 21 ug/mL — AB (ref 10–20)

## 2015-11-12 LAB — MAGNESIUM: MAGNESIUM: 2.1 mg/dL (ref 1.7–2.4)

## 2015-11-12 MED ORDER — POTASSIUM CHLORIDE 10 MEQ/100ML IV SOLN
10.0000 meq | INTRAVENOUS | Status: AC
  Administered 2015-11-12 (×4): 10 meq via INTRAVENOUS
  Filled 2015-11-12 (×4): qty 100

## 2015-11-12 MED ORDER — MORPHINE SULFATE (PF) 2 MG/ML IV SOLN
1.0000 mg | Freq: Once | INTRAVENOUS | Status: AC
  Administered 2015-11-12: 1 mg via INTRAVENOUS

## 2015-11-12 MED ORDER — ATORVASTATIN CALCIUM 40 MG PO TABS
40.0000 mg | ORAL_TABLET | Freq: Every day | ORAL | Status: DC
Start: 2015-11-12 — End: 2015-11-13

## 2015-11-12 MED ORDER — AMOXICILLIN-POT CLAVULANATE 250-62.5 MG/5ML PO SUSR
875.0000 mg | Freq: Two times a day (BID) | ORAL | Status: DC
  Filled 2015-11-12: qty 17.5

## 2015-11-12 MED ORDER — ACETAMINOPHEN 325 MG PO TABS: 650.0000 mg | ORAL_TABLET | Freq: Four times a day (QID) | ORAL | Status: AC | PRN

## 2015-11-12 MED ORDER — ALPRAZOLAM 0.25 MG PO TABS: 0.1250 mg | ORAL_TABLET | Freq: Three times a day (TID) | ORAL | Status: DC | PRN

## 2015-11-12 MED ORDER — PRO-STAT SUGAR FREE PO LIQD: 30.0000 mL | Freq: Three times a day (TID) | ORAL | Status: DC

## 2015-11-12 MED ORDER — PANTOPRAZOLE SODIUM 40 MG IV SOLR
40.0000 mg | INTRAVENOUS | Status: DC
  Administered 2015-11-12: 40 mg via INTRAVENOUS
  Filled 2015-11-12: qty 40

## 2015-11-12 MED ORDER — AMIODARONE HCL 200 MG PO TABS
400.0000 mg | ORAL_TABLET | Freq: Every day | ORAL | Status: DC
  Administered 2015-11-12: 400 mg via ORAL
  Filled 2015-11-12: qty 2

## 2015-11-12 MED ORDER — MORPHINE SULFATE (CONCENTRATE) 10 MG/0.5ML PO SOLN: 5.0000 mg | ORAL | Status: DC | PRN

## 2015-11-12 MED ORDER — PANTOPRAZOLE SODIUM 40 MG PO TBEC: 40.0000 mg | DELAYED_RELEASE_TABLET | Freq: Every day | ORAL | Status: DC

## 2015-11-12 MED ORDER — SODIUM CHLORIDE 0.9% FLUSH: 10.0000 mL | INTRAVENOUS | Status: DC | PRN

## 2015-11-12 MED ORDER — VANCOMYCIN HCL IN DEXTROSE 1-5 GM/200ML-% IV SOLN
1000.0000 mg | INTRAVENOUS | Status: DC
  Administered 2015-11-13: 1000 mg via INTRAVENOUS
  Filled 2015-11-12: qty 200

## 2015-11-12 MED ORDER — AMIODARONE HCL 400 MG PO TABS
400.0000 mg | ORAL_TABLET | Freq: Every day | ORAL | Status: DC
Start: 2015-11-12 — End: 2015-11-13

## 2015-11-12 MED ORDER — AMOXICILLIN-POT CLAVULANATE 400-57 MG/5ML PO SUSR
875.0000 mg | Freq: Two times a day (BID) | ORAL | Status: DC
  Administered 2015-11-12: 872 mg via ORAL
  Filled 2015-11-12 (×3): qty 10.9

## 2015-11-12 MED ORDER — ASPIRIN 81 MG PO CHEW: 81.0000 mg | CHEWABLE_TABLET | Freq: Every day | ORAL | Status: DC

## 2015-11-12 MED ORDER — FERROUS GLUCONATE 324 (38 FE) MG PO TABS: 324.0000 mg | ORAL_TABLET | Freq: Every day | ORAL | Status: DC

## 2015-11-12 MED ORDER — PANTOPRAZOLE SODIUM 40 MG PO PACK: 40.0000 mg | PACK | ORAL | Status: DC

## 2015-11-12 MED ORDER — VANCOMYCIN HCL IN DEXTROSE 1-5 GM/200ML-% IV SOLN: 1000.0000 mg | INTRAVENOUS | Status: DC

## 2015-11-12 MED ORDER — SODIUM CHLORIDE 0.9% FLUSH: 10.0000 mL | Freq: Two times a day (BID) | INTRAVENOUS | Status: DC

## 2015-11-12 MED ORDER — MORPHINE SULFATE (CONCENTRATE) 10 MG/0.5ML PO SOLN
5.0000 mg | ORAL | Status: DC | PRN
  Administered 2015-11-13: 5 mg via ORAL
  Filled 2015-11-12: qty 1

## 2015-11-12 MED ORDER — PROCHLORPERAZINE 25 MG RE SUPP: 25.0000 mg | Freq: Two times a day (BID) | RECTAL | Status: DC | PRN

## 2015-11-12 MED ORDER — BISACODYL 10 MG RE SUPP: 10.0000 mg | Freq: Every day | RECTAL | Status: DC | PRN

## 2015-11-12 MED ORDER — LANSOPRAZOLE 15 MG PO TBDP: 15.0000 mg | ORAL_TABLET | Freq: Every day | ORAL | Status: DC

## 2015-11-12 MED ORDER — SODIUM CHLORIDE 0.9 % IV SOLN
3.0000 g | Freq: Four times a day (QID) | INTRAVENOUS | Status: DC
  Administered 2015-11-12: 3 g via INTRAVENOUS
  Filled 2015-11-12 (×2): qty 3

## 2015-11-12 MED ORDER — AMOXICILLIN-POT CLAVULANATE 250-62.5 MG/5ML PO SUSR: 875.0000 mg | Freq: Two times a day (BID) | ORAL | Status: DC

## 2015-11-12 MED ORDER — POTASSIUM CHLORIDE 20 MEQ PO PACK: 20.0000 meq | PACK | Freq: Two times a day (BID) | ORAL | Status: DC

## 2015-11-12 MED ORDER — POTASSIUM CHLORIDE 20 MEQ PO PACK
20.0000 meq | PACK | Freq: Two times a day (BID) | ORAL | Status: DC
  Administered 2015-11-12 – 2015-11-13 (×2): 20 meq via ORAL
  Filled 2015-11-12 (×2): qty 1

## 2015-11-12 MED ORDER — VANCOMYCIN HCL IN DEXTROSE 750-5 MG/150ML-% IV SOLN: 750.0000 mg | INTRAVENOUS | Status: DC

## 2015-11-12 MED ORDER — SENNA 8.6 MG PO TABS: 2.0000 | ORAL_TABLET | Freq: Every day | ORAL | Status: DC

## 2015-11-12 NOTE — Progress Notes (Signed)
Physical Therapy Evaluation Patient Details Name: Matthew Foley MRN: [id number] DOB: 1933/02/02 Today's Date: 11/12/2015   History of Present Illness  Matthew Foley is a 82 y.o. male with a known history of Hypertension, hyperlipidemia, coronary artery disease, rheumatoid arthritis, hypothyroidism, atrial fibrillation presented to the emergency room for abnormal blood work and confusion. Patient was recently admitted to our hospital a week ago for fall and dehydration and he was discharged to rehabilitation facility.  Pt found to be in renal failure, elevated K+, tachycardia with HR in 170's with cardioversion in ED and started on IV amiodarone drip.  Clinical Impression  Pt presents to PT this date with severe weakness and inability to stand or walk.  Per chart review, pt from [facility] where pt reports he was "working on walking".  Pt required Max A +2 for supine to sit transfers and Max A to maintain sitting balance today.  Pt unable to feed himself, but motivated to eat orange sherbert.  Pt sat EOB for 10 min to eat until he became so fatigued that he could no longer sit.  This is a significant decrease in strength and mobility since his last visit to this hospital and from prior PT notes (discharge date 10/31/15).  Pt would benefit from acute PT services to address objective findings.    Follow Up Recommendations SNF    Equipment Recommendations  Other (comment) (defer to post acute)    Recommendations for Other Services       Precautions / Restrictions Precautions Precautions: Fall Precaution Comments: High Restrictions Weight Bearing Restrictions: No      Mobility  Bed Mobility Overal bed mobility: Needs Assistance Bed Mobility: Rolling;Supine to Sit;Sit to Supine Rolling: Max assist   Supine to sit: Max assist;+2 for physical assistance Sit to supine: Max assist;+2 for physical assistance   General bed mobility comments: Max A for sitting balance for most of  session with one brief period of sitting with CGA.  Transfers                 General transfer comment: Pt unabale to stand with date due to weakness.  Ambulation/Gait             General Gait Details: unable  Stairs            Wheelchair Mobility    Modified Rankin (Stroke Patients Only)       Balance   Sitting-balance support: Bilateral upper extremity supported;Feet supported Sitting balance-Leahy Scale: Poor                                       Pertinent Vitals/Pain Pain Assessment: 0-10 Pain Location: Asking for pain medicine several times throughout session; non focal    Home Living Family/patient expects to be discharged to:: Skilled nursing facility Living Arrangements: Alone               Additional Comments: According to chart pt living alone prior to fall and subsequent hospitalization then discharged to SNF; pt reports getting therapy at SNF, unsure of reliability of information.    Prior Function Level of Independence: Needs assistance   Gait / Transfers Assistance Needed: per patient, working on walking with therapy     Comments: no family present to give most recent level of care     Hand Dominance        Extremity/Trunk Assessment   Upper  Extremity Assessment: RUE deficits/detail;LUE deficits/detail RUE Deficits / Details: grossly 2/5 B UE's; some voluntary movements, very limited; swelling RUE: Unable to fully assess due to pain   LUE Deficits / Details: grossly 2/5   Lower Extremity Assessment: RLE deficits/detail;LLE deficits/detail RLE Deficits / Details: grossly 2/5 LLE Deficits / Details: grossly 2/5     Communication   Communication: No difficulties  Cognition Arousal/Alertness: Awake/alert Behavior During Therapy: WFL for tasks assessed/performed Overall Cognitive Status: Difficult to assess Area of Impairment: Orientation Orientation Level: Disoriented to;Time;Situation   Memory:  Decreased short-term memory              General Comments General comments (skin integrity, edema, etc.): R UE swelling    Exercises        Assessment/Plan    PT Assessment Patient needs continued PT services  PT Diagnosis Generalized weakness;Acute pain   PT Problem List Decreased strength;Decreased range of motion;Decreased activity tolerance;Decreased balance;Decreased mobility;Pain  PT Treatment Interventions Therapeutic activities;Therapeutic exercise;Balance training;Cognitive remediation;Functional mobility training   PT Goals (Current goals can be found in the Care Plan section) Acute Rehab PT Goals Patient Stated Goal: None stated. PT Goal Formulation: Patient unable to participate in goal setting Time For Goal Achievement: 11/19/15 Potential to Achieve Goals: Fair    Frequency Min 2X/week   Barriers to discharge Decreased caregiver support lives alone    Co-evaluation               End of Session   Activity Tolerance: Patient limited by fatigue Patient left: in bed;with bed alarm set Nurse Communication: Mobility status         Time: 2956-2130 PT Time Calculation (min) (ACUTE ONLY): 33 min   Charges:   PT Evaluation $PT Eval Low Complexity: 1 Procedure PT Treatments $Therapeutic Activity: 8-22 mins   PT G Codes:        Elyan Vanwieren A Jasiyah Paulding, PT 11/12/2015, 12:35 PM

## 2015-11-12 NOTE — Progress Notes (Addendum)
CSW attempted to give bed offers to patient's daughter. Left a voicemail. Awaiting phone call back.   CSW spoke to patient's daughter- Sedalia Muta. Presented bed offers. Stated she'd like to review the bed offers and discuss it with her sister. Stated she'll call CSW back with a decision, CSW will continue to follow and assist.   [name], MSW, LCSW-A Clinical Social Work Department [phone]

## 2015-11-12 NOTE — Progress Notes (Signed)
CSW spoke to patient's daughter Denies. She reports that she would like for patient to return to [facility] after discussing options with patient's other daughters. CSW contacted [employer] at [facility] to begin [employer]. Awaiting [employer] approval. CSW will continue to follow and assist.   [name], MSW, LCSW-A Clinical Social Work Department [phone]

## 2015-11-12 NOTE — Progress Notes (Signed)
PARENTERAL NUTRITION CONSULT NOTE - FOLLOW UP   Pharmacy Consult for Electrolyte Monitoring  No Known Allergies  Patient Measurements: Height: 6' (182.9 cm) Weight: 175 lb (79.379 kg) IBW/kg (Calculated) : 77.6  Vital Signs: Temp: 97.3 F (36.3 C) (05/23 0812) Temp Source: Oral (05/23 0812) BP: 115/62 mmHg (05/23 0812) Pulse Rate: 90 (05/23 0812) Intake/Output from previous day: 05/22 0701 - 05/23 0700 In: 120 [P.O.:120] Out: 3200 [Urine:3200] Intake/Output from this shift:    Labs:  Recent Labs  11/10/15 0556 11/11/15 0438 11/12/15 0759  WBC 16.4* 13.9* 14.1*  HGB 9.7* 9.1* 9.1*  HCT 29.1* 27.5* 27.8*  PLT 56* 62* 101*     Recent Labs  11/10/15 0556 11/10/15 1401 11/11/15 0438 11/12/15 0759  NA 141  --  140 140  K 2.8* 3.2* 3.4* 3.3*  CL 114*  --  112* 110  CO2 22  --  23 26  GLUCOSE 135*  --  153* 103*  BUN 36*  --  38* 33*  CREATININE 1.08  --  1.02 0.93  CALCIUM 7.0*  --  7.2* 7.4*  MG 1.6*  --  1.6* 2.1  PHOS 2.2*  --  2.6 2.8   Estimated Creatinine Clearance: 69.5 mL/min (by C-G formula based on Cr of 0.93).   No results for input(s): GLUCAP in the last 72 hours.  MedicalAssessment: Pharmacy consulted for electrolyte monitoring for 82 yo male experiencing electrolyte disturbances. Patient currently ordered 1/2NS with Potassium 14mEq/L at 65mL/hr. Patient received potassium IV x 4 and magnesium 4g IV x 1 on 5/22.   Plan:  Will replace potassium IV x 4. Will order potassium VT BID starting at 2200. Will obtain follow-up electrolytes with am labs.    Pharmacy will continue to monitor and adjust per consult.     Simpson,Michael L 11/12/2015,9:03 AM

## 2015-11-12 NOTE — Progress Notes (Signed)
SUBJECTIVE: Patient is still not alert and oriented   Filed Vitals:   11/11/15 1139 11/12/15 0415 11/12/15 0420 11/12/15 0812  BP: 123/79 119/64  115/62  Pulse: 105 98  90  Temp: 98.1 F (36.7 C) 98.7 F (37.1 C)  97.3 F (36.3 C)  TempSrc: Oral   Oral  Resp: 21 22  20   Height:      Weight:   175 lb (79.379 kg)   SpO2: 99% 98%  98%    Intake/Output Summary (Last 24 hours) at 11/12/15 0830 Last data filed at 11/12/15 0742  Gross per 24 hour  Intake    120 ml  Output   3200 ml  Net  -3080 ml    LABS: Basic Metabolic Panel:  Recent Labs  11/02/2015 0438 11/12/15 0759  NA 140 140  K 3.4* 3.3*  CL 112* 110  CO2 23 26  GLUCOSE 153* 103*  BUN 38* 33*  CREATININE 1.02 0.93  CALCIUM 7.2* 7.4*  MG 1.6* 2.1  PHOS 2.6 2.8   Liver Function Tests: No results for input(s): AST, ALT, ALKPHOS, BILITOT, PROT, ALBUMIN in the last 72 hours. No results for input(s): LIPASE, AMYLASE in the last 72 hours. CBC:  Recent Labs  11/11/15 0438 11/12/15 0759  WBC 13.9* 14.1*  HGB 9.1* 9.1*  HCT 27.5* 27.8*  MCV 94.2 94.9  PLT 62* 101*   Cardiac Enzymes: No results for input(s): CKTOTAL, CKMB, CKMBINDEX, TROPONINI in the last 72 hours. BNP: Invalid input(s): POCBNP D-Dimer: No results for input(s): DDIMER in the last 72 hours. Hemoglobin A1C: No results for input(s): HGBA1C in the last 72 hours. Fasting Lipid Panel: No results for input(s): CHOL, HDL, LDLCALC, TRIG, CHOLHDL, LDLDIRECT in the last 72 hours. Thyroid Function Tests: No results for input(s): TSH, T4TOTAL, T3FREE, THYROIDAB in the last 72 hours.  Invalid input(s): FREET3 Anemia Panel: No results for input(s): VITAMINB12, FOLATE, FERRITIN, TIBC, IRON, RETICCTPCT in the last 72 hours.   PHYSICAL EXAM General: Well developed, well nourished, in no acute distress HEENT:  Normocephalic and atramatic Neck:  No JVD.  Lungs: Clear bilaterally to auscultation and percussion. Heart: HRRR . Normal S1 and S2 without  gallops or murmurs.  Abdomen: Bowel sounds are positive, abdomen soft and non-tender  Msk:  Back normal, normal gait. Normal strength and tone for age. Extremities: No clubbing, cyanosis or edema.   Neuro: Alert and oriented X 3. Psych:  Good affect, responds appropriately  TELEMETRY:A. fib at 90 bpm  ASSESSMENT AND PLAN: Atrial fibrillation with controlled ventricular response rate. We'll sign out today. Continue current medication.  Principal Problem:   Wide-complex tachycardia (HCC) Active Problems:   Altered mental status   Acute renal failure (ARF) (HCC)   Ventricular tachyarrhythmia (HCC)    Annmarie Plemmons A, MD, [facility] 11/12/2015 8:30 AM

## 2015-11-12 NOTE — Progress Notes (Signed)
[employer] [facility] [place] [facility]   PATIENT NAME: Matthew Foley    MR#:  [id number]  DATE OF BIRTH:  [birth year]  SUBJECTIVE:   Patient is here sepsis secondary to MRSA UTI and bacteremia. NG tube removed yesterday and tolerating full liquids well.  HR stable.  Afebrile.  Repeat BC from 5/20 are (-) so far.   REVIEW OF SYSTEMS:    Review of Systems  Constitutional: Negative for fever and chills.  HENT: Negative for congestion and tinnitus.   Eyes: Negative for blurred vision and double vision.  Respiratory: Negative for cough, shortness of breath and wheezing.   Cardiovascular: Negative for chest pain, orthopnea and PND.  Gastrointestinal: Negative for nausea, vomiting, abdominal pain and diarrhea.  Genitourinary: Negative for dysuria and hematuria.  Neurological: Negative for dizziness, sensory change and focal weakness.  All other systems reviewed and are negative.   Nutrition: Tube feeds Tolerating Diet: Yes Tolerating PT: Await evaluation   DRUG ALLERGIES:  No Known Allergies  VITALS:  Blood pressure 128/65, pulse 102, temperature 97.6 F (36.4 C), temperature source Oral, resp. rate 19, height 6' (1.829 m), weight 79.379 kg (175 lb), SpO2 97 %.  PHYSICAL EXAMINATION:   Physical Exam  GENERAL:  82 y.o.-year-old patient lying in the bed in no acute distress.  EYES: Pupils equal, round, reactive to light and accommodation. No scleral icterus. Extraocular muscles intact.  HEENT: Head atraumatic, normocephalic. Oropharynx and nasopharynx clear. Moist Oral Mucosa. NECK:  Supple, no jugular venous distention. No thyroid enlargement, no tenderness.  LUNGS: Poor respiratory effort, no wheezing, rales, rhonchi. No use of accessory muscles of respiration.  CARDIOVASCULAR: S1, S2 Irregular. No murmurs, rubs, or gallops.  ABDOMEN: Soft, nontender, nondistended. Bowel sounds present. No organomegaly or mass.  EXTREMITIES: No cyanosis, clubbing or +1-2 edema b/l.     NEUROLOGIC: Cranial nerves II through XII are intact. No focal Motor or sensory deficits b/l. Globally weak   PSYCHIATRIC: The patient is alert and oriented x 2.  SKIN: No obvious rash, lesion, Stage III Decubitus ulcer.  Signs of PVD b/l   LABORATORY PANEL:   CBC  Recent Labs Lab 11/12/15 0759  WBC 14.1*  HGB 9.1*  HCT 27.8*  PLT 101*   ------------------------------------------------------------------------------------------------------------------  Chemistries   Recent Labs Lab 11/08/15 0348  11/12/15 0759  NA 140  < > 140  K 3.5  < > 3.3*  CL 117*  < > 110  CO2 18*  < > 26  GLUCOSE 156*  < > 103*  BUN 30*  < > 33*  CREATININE 1.09  < > 0.93  CALCIUM 7.4*  < > 7.4*  MG 1.5*  < > 2.1  AST 70*  --   --   ALT 42  --   --   ALKPHOS 75  --   --   BILITOT 1.0  --   --   < > = values in this interval not displayed. ------------------------------------------------------------------------------------------------------------------  Cardiac Enzymes No results for input(s): TROPONINI in the last 168 hours. ------------------------------------------------------------------------------------------------------------------  RADIOLOGY:  Dg Chest 1 View  11/12/2015  CLINICAL DATA:  PICC placement. EXAM: CHEST 1 VIEW COMPARISON:  11/09/2015 FINDINGS: Left PICC has been placed and terminates near the cavoatrial junction. Enteric tube has been removed. The cardiac silhouette is upper limits of normal in size. Thoracic aortic atherosclerosis is noted. Patchy bilateral lung opacities are greatest in the left perihilar region and right base and have mildly improved from the prior study. No  sizable pleural effusion or pneumothorax is identified. There is biapical pleural thickening. Right upper quadrant abdominal surgical clips are noted. IMPRESSION: 1. Left PICC terminates over the lower SVC/ cavoatrial junction. 2. Mild interval improvement of bilateral lung opacities suggestive of  multifocal pneumonia. Electronically Signed   By: Sebastian Ache M.D.   On: 11/12/2015 14:22   Dg Abd Portable 1v  11/10/2015  CLINICAL DATA:  NG tube placement. EXAM: PORTABLE ABDOMEN - 1 VIEW COMPARISON:  Abdomen plain film dated 11/08/2015. FINDINGS: Nasogastric tube tip is in the stomach fundus region, with proximal side holes at the level of the gastroesophageal junction. Visualized bowel gas pattern is nonobstructive. No evidence of free intraperitoneal air. IMPRESSION: Nasogastric tube tip is in the upper stomach, with proximal side holes at or just below the level of the gastroesophageal junction. Recommend advancing approximately 10 cm for optimal radiographic positioning. Electronically Signed   By: Bary Richard M.D.   On: 11/10/2015 20:34     ASSESSMENT AND PLAN:   82 year old male with past medical history of hypertension, hyperlipidemia, coronary artery disease, rheumatoid arthritis, chronic afibrillation, GERD, depression who presented to the hospital due to altered mental status and noted to be in acute renal failure with hyperkalemia and atrial fibrillation with RVR.  1. Sepsis-secondary to MRSA UTI and bacteremia. -Continue IV vancomycin and patient will likely need treatment for at total of 2 weeks. -TTE is negative for any evidence of vegetation. -Repeat blood cultures from 11/09/2015 on negative so far. Patient infectious disease input and we'll get PICC line today.  2. Urinary tract infection-secondary to MRSA. Continue IV vancomycin. -s/p Foley due to urinary retention.  Continue Flomax for now. Will try to remove foley tomorrow again.   3. Altered mental status-metabolic encephalopathy secondary to the sepsis/UTI. Much improved and close to baseline now  4. Acute kidney injury-secondary to sepsis and ATN with underlying UTI with urinary retention. Cr. Back to baseline. - Foley due to urinary retention. Cont. Flomax and will attempt to remove tomorrow.   5.  Hypokalemia/Hypomagnesemia- improved w/ supplementation and will monitor.   6. Dysphagia-secondary to altered mental status and encephalopathy. -improving.  Diet advanced to dysphagia 1 with nectar thick liquids today and will monitor. Remains high risk for aspiration.   7. Thrombocytopenia-platelets count is stable and improving. - No acute bleeding. Heparin induced antibody test has been sent results pending.   8. Atrial fibrillation with rapid ventricular response- off amio gtt as per Cards now.  - cont. Oral Amio and rate controlled.   9. Elevated troponin-likely secondary to demand ischemia from atrial fibrillation with rapid ventricular response and underlying sepsis. - no plan for any intervention as per Cardiology.   10. Sacral decubitus - Stage III. Cont. Local wound care as per wound team.   Palliative Care consult to discuss goals of care with family.  Pt. Is a partial code.    All the records are reviewed and case discussed with Care Management/Social Workerr. Management plans discussed with the patient, family and they are in agreement.  CODE STATUS: Partial  DVT Prophylaxis: Teds and SCDs  TOTAL TIME TAKING CARE OF THIS PATIENT: 25 minutes.   POSSIBLE D/C IN 2-3 DAYS, DEPENDING ON CLINICAL CONDITION.   Houston Siren M.D on 11/12/2015 at 3:47 PM  Between 7am to 6pm - Pager - [phone]  After 6pm go to [email or web address] - password [email or web address] [facility]  [facility]  Office  [phone]  CC: Primary care physician; Jaclyn Shaggy, MD

## 2015-11-12 NOTE — Progress Notes (Signed)
Dressings changed per MD orders. Pt tolerated well

## 2015-11-12 NOTE — Progress Notes (Signed)
[facility] called for PICC line insertion

## 2015-11-12 NOTE — Progress Notes (Signed)
[facility] CLINIC INFECTIOUS DISEASE PROGRESS NOTE Date of Admission:  11/19/2015     ID: Llewellyn Choplin is a 82 y.o. male with MRSA bacteremia, ARF due to urinary retention with MRSA UTI, sacral decubitus ulcer  Principal Problem:   Wide-complex tachycardia (HCC) Active Problems:   Altered mental status   Acute renal failure (ARF) (HCC)   Ventricular tachyarrhythmia (HCC)   Subjective: Remains lethargic, no real fevers Out of unit   ROS  Unable to obtain  Medications:  Antibiotics Given (last 72 hours)    Date/Time Action Medication Dose Rate   11/09/15 2244 Given   ceFEPIme (MAXIPIME) 1 g in dextrose 5 % 50 mL IVPB 1 g 100 mL/hr   11/10/15 0130 Given   vancomycin (VANCOCIN) IVPB 750 mg/150 ml premix 750 mg 150 mL/hr   11/10/15 1030 Given   ceFEPIme (MAXIPIME) 2 g in dextrose 5 % 50 mL IVPB 2 g 100 mL/hr   11/10/15 1228 Given   vancomycin (VANCOCIN) IVPB 750 mg/150 ml premix 750 mg 150 mL/hr   11/10/15 2156 Given   ceFEPIme (MAXIPIME) 2 g in dextrose 5 % 50 mL IVPB 2 g 100 mL/hr   11/10/15 2230 Given   vancomycin (VANCOCIN) IVPB 750 mg/150 ml premix 750 mg 150 mL/hr   11/11/15 1256 Given   vancomycin (VANCOCIN) IVPB 750 mg/150 ml premix 750 mg 150 mL/hr   11/11/15 2155 Given   vancomycin (VANCOCIN) IVPB 750 mg/150 ml premix 750 mg 150 mL/hr   11/12/15 1128 Given   vancomycin (VANCOCIN) IVPB 750 mg/150 ml premix 750 mg 150 mL/hr     . amiodarone  400 mg Oral Daily  . aspirin  81 mg Oral Daily  . atorvastatin  40 mg Oral q1800  . digoxin  0.25 mg Intravenous Daily  . feeding supplement (PRO-STAT SUGAR FREE 64)  30 mL Oral TID  . levothyroxine  50 mcg Intravenous Daily  . mupirocin ointment   Nasal BID  . pantoprazole sodium  40 mg Per Tube Q24H  . potassium chloride  20 mEq Oral BID  . potassium chloride  10 mEq Intravenous Q1 Hr x 4  . sodium chloride flush  3 mL Intravenous Q12H  . tamsulosin  0.4 mg Oral Daily  . [START ON 11/13/2015] vancomycin  750 mg  Intravenous Q18H    Objective: Vital signs in last 24 hours: Temp:  [97.3 F (36.3 C)-98.7 F (37.1 C)] 97.6 F (36.4 C) (05/23 1217) Pulse Rate:  [90-102] 102 (05/23 1217) Resp:  [19-22] 19 (05/23 1217) BP: (115-128)/(62-65) 128/65 mmHg (05/23 1217) SpO2:  [97 %-98 %] 97 % (05/23 1217) Weight:  [79.379 kg (175 lb)] 79.379 kg (175 lb) (05/23 0420) Constitutional: frail, thin chronciallyu ill appearing HENT: PERRLA anicteric Mouth/Throat: Oropharynx is clear and dry. No oropharyngeal exudate.  Cardiovascular:Tachy No murmur heard.  Pulmonary/Chest: Effort normal and breath sounds normal. No respiratory distress. He has no wheezes.  Abdominal: Soft. Bowel sounds are normal. He exhibits no distension. There is no tenderness.  Lymphadenopathy: He has no cervical adenopathy.  Neurological: lethargic Skin: R shoulder decub with dry eschar, sacral decub with necrotic eschar, some odor, no purulence Psychiatric: lethargic  Lab Results  Recent Labs  11/11/15 0438 11/12/15 0759  WBC 13.9* 14.1*  HGB 9.1* 9.1*  HCT 27.5* 27.8*  NA 140 140  K 3.4* 3.3*  CL 112* 110  CO2 23 26  BUN 38* 33*  CREATININE 1.02 0.93    Microbiology: Results for orders placed or  performed during the hospital encounter of 11/13/2015  MRSA PCR Screening     Status: Abnormal   Collection Time: 11/05/15  3:48 AM  Result Value Ref Range Status   MRSA by PCR POSITIVE (A) NEGATIVE Final    Comment:        The GeneXpert MRSA Assay (FDA approved for NASAL specimens only), is one component of a comprehensive MRSA colonization surveillance program. It is not intended to diagnose MRSA infection nor to guide or monitor treatment for MRSA infections. CALLED TO EMMA EGWUATU @ 0515 ON 11/05/2015 BY CAF   Urine culture     Status: Abnormal   Collection Time: 11/05/15 10:00 AM  Result Value Ref Range Status   Specimen Description URINE, CATHETERIZED  Final   Special Requests NONE  Final   Culture (A)  Final     >=100,000 COLONIES/mL METHICILLIN RESISTANT STAPHYLOCOCCUS AUREUS   Report Status 11/07/2015 FINAL  Final   Organism ID, Bacteria METHICILLIN RESISTANT STAPHYLOCOCCUS AUREUS (A)  Final      Susceptibility   Methicillin resistant staphylococcus aureus - MIC*    CIPROFLOXACIN >=8 RESISTANT Resistant     GENTAMICIN <=0.5 SENSITIVE Sensitive     NITROFURANTOIN <=16 SENSITIVE Sensitive     OXACILLIN >=4 RESISTANT Resistant     TETRACYCLINE <=1 SENSITIVE Sensitive     VANCOMYCIN 1 SENSITIVE Sensitive     TRIMETH/SULFA <=10 SENSITIVE Sensitive     CLINDAMYCIN <=0.25 SENSITIVE Sensitive     RIFAMPIN <=0.5 SENSITIVE Sensitive     Inducible Clindamycin NEGATIVE Sensitive     * >=100,000 COLONIES/mL METHICILLIN RESISTANT STAPHYLOCOCCUS AUREUS  Urine culture     Status: Abnormal   Collection Time: 11/06/15  6:29 PM  Result Value Ref Range Status   Specimen Description URINE, CATHETERIZED  Final   Special Requests Normal  Final   Culture (A)  Final    >=100,000 COLONIES/mL METHICILLIN RESISTANT STAPHYLOCOCCUS AUREUS   Report Status 11/09/2015 FINAL  Final   Organism ID, Bacteria METHICILLIN RESISTANT STAPHYLOCOCCUS AUREUS (A)  Final      Susceptibility   Methicillin resistant staphylococcus aureus - MIC*    CIPROFLOXACIN >=8 RESISTANT Resistant     GENTAMICIN <=0.5 SENSITIVE Sensitive     NITROFURANTOIN <=16 SENSITIVE Sensitive     OXACILLIN >=4 RESISTANT Resistant     TETRACYCLINE <=1 SENSITIVE Sensitive     VANCOMYCIN 1 SENSITIVE Sensitive     TRIMETH/SULFA <=10 SENSITIVE Sensitive     CLINDAMYCIN <=0.25 SENSITIVE Sensitive     RIFAMPIN <=0.5 SENSITIVE Sensitive     Inducible Clindamycin NEGATIVE Sensitive     * >=100,000 COLONIES/mL METHICILLIN RESISTANT STAPHYLOCOCCUS AUREUS  CULTURE, BLOOD (ROUTINE X 2) w Reflex to ID Panel     Status: Abnormal   Collection Time: 11/06/15  8:28 PM  Result Value Ref Range Status   Specimen Description BLOOD LEFT HAND  Final   Special Requests  BOTTLES DRAWN AEROBIC AND ANAEROBIC 1CCAERO,3CCANA  Final   Culture  Setup Time   Final    GRAM POSITIVE COCCI AEROBIC BOTTLE ONLY CRITICAL RESULT CALLED TO, READ BACK BY AND VERIFIED WITH: CHRISTINE KATSOUDAS 11/07/15 1115 MLM GPR PREVIOUSLY CALLED 11/07/15 MLZ  GRAM POSITIVE RODS ANAEROBIC BOTTLE ONLY    Culture (A)  Final    METHICILLIN RESISTANT STAPHYLOCOCCUS AUREUS AEROBIC BOTTLE ONLY CLOSTRIDIUM SPECIES ANAEROBIC BOTTLE ONLY BETA LACTAMASE POSITIVE ORGANISM 2    Report Status 11/12/2015 FINAL  Final   Organism ID, Bacteria METHICILLIN RESISTANT STAPHYLOCOCCUS AUREUS  Final  Susceptibility   Methicillin resistant staphylococcus aureus - MIC*    CIPROFLOXACIN 4 RESISTANT Resistant     ERYTHROMYCIN <=0.25 SENSITIVE Sensitive     GENTAMICIN <=0.5 SENSITIVE Sensitive     OXACILLIN >=4 RESISTANT Resistant     TETRACYCLINE <=1 SENSITIVE Sensitive     VANCOMYCIN 1 SENSITIVE Sensitive     TRIMETH/SULFA <=10 SENSITIVE Sensitive     CLINDAMYCIN <=0.25 SENSITIVE Sensitive     RIFAMPIN <=0.5 SENSITIVE Sensitive     Inducible Clindamycin NEGATIVE Sensitive     * METHICILLIN RESISTANT STAPHYLOCOCCUS AUREUS  Blood Culture ID Panel (Reflexed)     Status: Abnormal   Collection Time: 11/06/15  8:28 PM  Result Value Ref Range Status   Enterococcus species NOT DETECTED NOT DETECTED Final   Vancomycin resistance NOT DETECTED NOT DETECTED Final   Listeria monocytogenes NOT DETECTED NOT DETECTED Final   Staphylococcus species DETECTED (A) NOT DETECTED Final    Comment: CRITICAL RESULT CALLED TO, READ BACK BY AND VERIFIED WITH: CHRISTINE KATSOUDAS 11/07/15 1115 MLM    Staphylococcus aureus DETECTED (A) NOT DETECTED Final    Comment: CHRISTINE KATSOUDAS 11/07/15 1115 MLM   Methicillin resistance DETECTED (A) NOT DETECTED Final    Comment: CRITICAL RESULT CALLED TO, READ BACK BY AND VERIFIED WITH: CHRISTINE KATSOUDAS 11/07/15 1115 MLM    Streptococcus species NOT DETECTED NOT DETECTED Final    Streptococcus agalactiae NOT DETECTED NOT DETECTED Final   Streptococcus pneumoniae NOT DETECTED NOT DETECTED Final   Streptococcus pyogenes NOT DETECTED NOT DETECTED Final   Acinetobacter baumannii NOT DETECTED NOT DETECTED Final   Enterobacteriaceae species NOT DETECTED NOT DETECTED Final   Enterobacter cloacae complex NOT DETECTED NOT DETECTED Final   Escherichia coli NOT DETECTED NOT DETECTED Final   Klebsiella oxytoca NOT DETECTED NOT DETECTED Final   Klebsiella pneumoniae NOT DETECTED NOT DETECTED Final   Proteus species NOT DETECTED NOT DETECTED Final   Serratia marcescens NOT DETECTED NOT DETECTED Final   Carbapenem resistance NOT DETECTED NOT DETECTED Final   Haemophilus influenzae NOT DETECTED NOT DETECTED Final   Neisseria meningitidis NOT DETECTED NOT DETECTED Final   Pseudomonas aeruginosa NOT DETECTED NOT DETECTED Final   Candida albicans NOT DETECTED NOT DETECTED Final   Candida glabrata NOT DETECTED NOT DETECTED Final   Candida krusei NOT DETECTED NOT DETECTED Final   Candida parapsilosis NOT DETECTED NOT DETECTED Final   Candida tropicalis NOT DETECTED NOT DETECTED Final  CULTURE, BLOOD (ROUTINE X 2) w Reflex to ID Panel     Status: Abnormal   Collection Time: 11/06/15  8:36 PM  Result Value Ref Range Status   Specimen Description BLOOD RIGHT HAND  Final   Special Requests BOTTLES DRAWN AEROBIC AND ANAEROBIC 3CCAERO,2CCANA  Final   Culture  Setup Time   Final    GRAM POSITIVE RODS ANAEROBIC BOTTLE ONLY CRITICAL RESULT CALLED TO, READ BACK BY AND VERIFIED WITH: MELISSA MACCIA AT 1934 11/07/15 MLZ  CONFIRMED BY MSS GRAM POSITIVE COCCI AEROBIC BOTTLE ONLY CRITICAL VALUE NOTED.  VALUE IS CONSISTENT WITH PREVIOUSLY REPORTED AND CALLED VALUE.    Culture (A)  Final    CLOSTRIDIUM SPECIES METHICILLIN RESISTANT STAPHYLOCOCCUS AUREUS ANAEROBIC BOTTLE ONLY ORGANISM 1 AEROBIC BOTTLE ONLY ORGANISM 2 BETA LACTAMASE POSITIVE ORGANISM 1    Report Status 11/12/2015 FINAL  Final    Organism ID, Bacteria METHICILLIN RESISTANT STAPHYLOCOCCUS AUREUS  Final      Susceptibility   Methicillin resistant staphylococcus aureus - MIC*    CIPROFLOXACIN 4 RESISTANT Resistant  ERYTHROMYCIN <=0.25 SENSITIVE Sensitive     GENTAMICIN <=0.5 SENSITIVE Sensitive     OXACILLIN >=4 RESISTANT Resistant     TETRACYCLINE <=1 SENSITIVE Sensitive     VANCOMYCIN 1 SENSITIVE Sensitive     TRIMETH/SULFA <=10 SENSITIVE Sensitive     CLINDAMYCIN <=0.25 SENSITIVE Sensitive     RIFAMPIN <=0.5 SENSITIVE Sensitive     Inducible Clindamycin NEGATIVE Sensitive     * METHICILLIN RESISTANT STAPHYLOCOCCUS AUREUS  Culture, blood (Routine X 2) w Reflex to ID Panel     Status: Abnormal   Collection Time: 11/07/15  9:42 PM  Result Value Ref Range Status   Specimen Description BLOOD LEFT HAND  Final   Special Requests BOTTLES DRAWN AEROBIC AND ANAEROBIC  Final   Culture  Setup Time   Final    GRAM POSITIVE COCCI IN BOTH AEROBIC AND ANAEROBIC BOTTLES CRITICAL RESULT CALLED TO, READ BACK BY AND VERIFIED WITH: JASON ROBBINS AT 2037 11/08/2015 BY TFK CONFIRMED BY MLZ    Culture (A)  Final    METHICILLIN RESISTANT STAPHYLOCOCCUS AUREUS IN BOTH AEROBIC AND ANAEROBIC BOTTLES    Report Status 11/11/2015 FINAL  Final   Organism ID, Bacteria METHICILLIN RESISTANT STAPHYLOCOCCUS AUREUS  Final      Susceptibility   Methicillin resistant staphylococcus aureus - MIC*    CIPROFLOXACIN 4 RESISTANT Resistant     ERYTHROMYCIN <=0.25 SENSITIVE Sensitive     GENTAMICIN <=0.5 SENSITIVE Sensitive     OXACILLIN >=4 RESISTANT Resistant     TETRACYCLINE <=1 SENSITIVE Sensitive     VANCOMYCIN 1 SENSITIVE Sensitive     TRIMETH/SULFA <=10 SENSITIVE Sensitive     CLINDAMYCIN <=0.25 SENSITIVE Sensitive     RIFAMPIN <=0.5 SENSITIVE Sensitive     Inducible Clindamycin NEGATIVE Sensitive     * METHICILLIN RESISTANT STAPHYLOCOCCUS AUREUS  CULTURE, BLOOD (ROUTINE X 2) w Reflex to ID Panel     Status: None (Preliminary  result)   Collection Time: 11/09/15  1:05 PM  Result Value Ref Range Status   Specimen Description BLOOD LEFT ARM  Final   Special Requests BOTTLES DRAWN AEROBIC AND ANAEROBIC 7CC  Final   Culture NO GROWTH 3 DAYS  Final   Report Status PENDING  Incomplete  CULTURE, BLOOD (ROUTINE X 2) w Reflex to ID Panel     Status: None (Preliminary result)   Collection Time: 11/09/15  1:05 PM  Result Value Ref Range Status   Specimen Description BLOOD RIGHT HAND  Final   Special Requests BOTTLES DRAWN AEROBIC AND ANAEROBIC 7CC  Final   Culture NO GROWTH 3 DAYS  Final   Report Status PENDING  Incomplete    Studies/Results: Dg Abd Portable 1v  11/10/2015  CLINICAL DATA:  NG tube placement. EXAM: PORTABLE ABDOMEN - 1 VIEW COMPARISON:  Abdomen plain film dated 11/08/2015. FINDINGS: Nasogastric tube tip is in the stomach fundus region, with proximal side holes at the level of the gastroesophageal junction. Visualized bowel gas pattern is nonobstructive. No evidence of free intraperitoneal air. IMPRESSION: Nasogastric tube tip is in the upper stomach, with proximal side holes at or just below the level of the gastroesophageal junction. Recommend advancing approximately 10 cm for optimal radiographic positioning. Electronically Signed   By: Bary Richard M.D.   On: 11/10/2015 20:34    Assessment/Plan: Dray Dente is a 82 y.o. male with Staph aurues bacteremia, likely from UTI with acute urinary retention. He was recently admitted 5/8-11 following a fall with confusion Rhabdo, AKD. It does not  appear cultures were done at that time although did have wbc 17 at admit 5/8. He was afebrile throughout that admission and his wbc went down to 10 with no antibiotics. Currently he is in poor condition, has a sacral decub with necrotic eschar. Some concern for PNA as well Repeat bcx 5/18 were + and now cxs initially also + clostridium. FU 5/20 NGTD.  He has had a TTE which is neg for vegetations but is a poor  candidate for  TEE  PICC placed  Recommendations Continue vancomycin Add unasyn while inpatient for the clostridial bacteremia and  for the sacral wound  At dc would change unasyn to augmentin for another 14 days Continue wound care with Dakins  WIll need minimum 2 weeks of IV vanco for the MRSA bacteremia. See abx order sheet 5/23  I will sign off now but please call with questions.   FITZGERALD, DAVID P   11/12/2015, 1:27 PM

## 2015-11-12 NOTE — Consult Note (Addendum)
Pharmacy Antibiotic Note  Matthew Foley is a 82 y.o. male admitted on 11/20/2015 with sepsis. Pharmacy has been consulted for Unasyn and vancomycin dosing.  Plan: Will start Unasyn 3g IV q6h. Will transition vancomycin to 1000mg  IV q24h.   Next vanc trough prior to 4th new dose on 5/26 at0930.   Height: 6' (182.9 cm) Weight: 175 lb (79.379 kg) IBW/kg (Calculated) : 77.6  Temp (24hrs), Avg:97.9 F (36.6 C), Min:97.3 F (36.3 C), Max:98.7 F (37.1 C)   Recent Labs Lab 11/08/15 0348 11/09/15 0510 11/09/15 1058 11/10/15 0556 11/11/15 0438 11/12/15 0759 11/12/15 1113  WBC 15.4* 17.4*  --  16.4* 13.9* 14.1*  --   CREATININE 1.09 1.11  --  1.08 1.02 0.93  --   VANCOTROUGH  --   --  26*  --   --   --  21*    Estimated Creatinine Clearance: 69.5 mL/min (by C-G formula based on Cr of 0.93).    No Known Allergies  Antimicrobials this admission: cefepime 5/19>> 5/22 ceftaz 5/17 >> 5/19 vancomycin 5/17 >>  Unasyn 5/23>>  Dose adjustments this admission: Vancomycin 1 g IV q18 hours on 5/18.  Vancomycin 1 g IV q12 hours on 5/19  Vancomycin 750mg  IV q12hrs on 5/20 Cefepime 2 g iv q12hrs on 5/21 5/24 vancomycin transitioned to vanc 750mg  IV Q18hr.   Microbiology results: 5/20 BCx: NGTD x 2 5/18 BCx: S aureus 5/17 BCx: MRSA  5/16 UCx: MRSA   5/16 MRSA PCR: positive  Pharmacy will continue to monitor and adjust per consult.   6/17 11/12/2015

## 2015-11-12 NOTE — Progress Notes (Signed)
Pt complaining of pain, too soon for medication, Dr. Cherlynn Kaiser paged, order for 1 dose morphine received

## 2015-11-12 NOTE — Progress Notes (Addendum)
Speech Language Pathology Treatment: Dysphagia  Patient Details Name: Matthew Foley MRN: [id number] DOB: 1933-06-21 Today's Date: 11/12/2015 Time: 5009-3818 SLP Time Calculation (min) (ACUTE ONLY): 35 min  Assessment / Plan / Recommendation Clinical Impression  Pt appeared to tolerate few trials of purees during tx session today w/ no overt s/s of aspiration noted. Pt required full feeding assistance and appeared quite weak overall but no decline in respiratory status or change in vocal quality noted during/post trials given. Recommend trial to diet to Dysphagia 1(puree) w/ Nectar liquids - no change in consistency of liquids at this time sec. to the increased risk for aspiration d/t declined status; weakness. Recommend strict aspiration precautions and feeding assistance w/ all meals. ST will continue to f/u w/ toleration of diet and trials to upgrade when appropriate/safe.    HPI        SLP Plan  Continue with current plan of care     Recommendations  Diet recommendations: Dysphagia 1 (puree);Nectar-thick liquid Liquids provided via: Teaspoon;Cup Medication Administration: Crushed with puree Supervision: Staff to assist with self feeding;Full supervision/cueing for compensatory strategies Compensations: Minimize environmental distractions;Slow rate;Small sips/bites;Multiple dry swallows after each bite/sip;Follow solids with liquid (rest breaks as needed) Postural Changes and/or Swallow Maneuvers: Seated upright 90 degrees             General recommendations:  (Dietician) Oral Care Recommendations: Oral care BID;Staff/trained caregiver to provide oral care Follow up Recommendations: Skilled Nursing facility Plan: Continue with current plan of care     GO               Jerilynn Som, MS, CCC-SLP  Watson,Katherine 11/12/2015, 5:26 PM

## 2015-11-12 NOTE — Progress Notes (Signed)
Infectious Disease Long Term IV Antibiotic Orders  Diagnosis: MRSA bacteremia, Clostridial sepsis, Sacral decubitus, MRSA UTI from obstruction   Culture results MRSA bacteremia, Clostridial bacteremia Allergies: No Known Allergies  Discharge antibiotics Vancomycin             750      mg  every        18       hours .     Goal vancomycin trough 15-20.    Pharmacy to adjust dosing based on levels  Augmentin 875 BID oral PICC Care per protocol Labs weekly while on IV antibiotics      CBC w diff   Comprehensive met panel Vancomycin Trough    Planned duration of antibiotics -  Vancomycin 2 weeks from 5/20 -> June 5th  Augmentin - same -> June 5th  Stop date  June 5th   Follow up clinic date - NO need for ID Follow up - following June 5th stop of IV vanco please remove PICC line. Will need wound care arranged at facility as well. FAX weekly labs to [phone]  Leonel Ramsay, MD

## 2015-11-12 NOTE — Plan of Care (Signed)
Problem: Nutrition Goal: Patient maintains adequate hydration Outcome: Progressing Advanced to Dys I diet with nectar liquids

## 2015-11-13 ENCOUNTER — Inpatient Hospital Stay: Payer: Medicare PPO

## 2015-11-13 LAB — CBC
HEMATOCRIT: 25.5 % — AB (ref 40.0–52.0)
Hemoglobin: 8.5 g/dL — ABNORMAL LOW (ref 13.0–18.0)
MCH: 31.2 pg (ref 26.0–34.0)
MCHC: 33.4 g/dL (ref 32.0–36.0)
MCV: 93.4 fL (ref 80.0–100.0)
PLATELETS: 143 10*3/uL — AB (ref 150–440)
RBC: 2.73 MIL/uL — ABNORMAL LOW (ref 4.40–5.90)
RDW: 14.8 % — AB (ref 11.5–14.5)
WBC: 14.8 10*3/uL — ABNORMAL HIGH (ref 3.8–10.6)

## 2015-11-13 LAB — BLOOD GAS, ARTERIAL
ACID-BASE EXCESS: 3.2 mmol/L — AB (ref 0.0–3.0)
Allens test (pass/fail): POSITIVE — AB
BICARBONATE: 26.8 meq/L (ref 21.0–28.0)
FIO2: 28
O2 SAT: 91.3 %
PCO2 ART: 36 mmHg (ref 32.0–48.0)
PO2 ART: 57 mmHg — AB (ref 83.0–108.0)
Patient temperature: 37
pH, Arterial: 7.48 — ABNORMAL HIGH (ref 7.350–7.450)

## 2015-11-13 LAB — BASIC METABOLIC PANEL
Anion gap: 6 (ref 5–15)
BUN: 37 mg/dL — AB (ref 6–20)
CHLORIDE: 112 mmol/L — AB (ref 101–111)
CO2: 24 mmol/L (ref 22–32)
CREATININE: 1.15 mg/dL (ref 0.61–1.24)
Calcium: 7.5 mg/dL — ABNORMAL LOW (ref 8.9–10.3)
GFR calc Af Amer: >60 mL/min (ref >60)
GFR calc non Af Amer: 58 mL/min — ABNORMAL LOW (ref >60)
GLUCOSE: 80 mg/dL (ref 65–99)
POTASSIUM: 4.3 mmol/L (ref 3.5–5.1)
Sodium: 142 mmol/L (ref 135–145)

## 2015-11-13 MED ORDER — LORAZEPAM 0.5 MG PO TABS: 0.5000 mg | ORAL_TABLET | ORAL | Status: AC | PRN

## 2015-11-13 MED ORDER — BISACODYL 10 MG RE SUPP: 10.0000 mg | Freq: Every day | RECTAL | Status: AC | PRN

## 2015-11-13 MED ORDER — PROCHLORPERAZINE 25 MG RE SUPP
25.0000 mg | Freq: Three times a day (TID) | RECTAL | Status: DC | PRN
Start: 2015-11-13 — End: 2015-11-14
  Filled 2015-11-13: qty 1

## 2015-11-13 MED ORDER — AMIODARONE HCL IN DEXTROSE 360-4.14 MG/200ML-% IV SOLN
30.0000 mg/h | INTRAVENOUS | Status: DC
  Filled 2015-11-13 (×2): qty 200

## 2015-11-13 MED ORDER — MORPHINE 100MG IN NS 100ML (1MG/ML) PREMIX INFUSION
2.0000 mg/h | INTRAVENOUS | Status: DC
  Administered 2015-11-13: 2 mg/h via INTRAVENOUS
  Filled 2015-11-13: qty 100

## 2015-11-13 MED ORDER — AMIODARONE IV BOLUS ONLY 150 MG/100ML
150.0000 mg | Freq: Once | INTRAVENOUS | Status: AC
  Administered 2015-11-13: 150 mg via INTRAVENOUS
  Filled 2015-11-13: qty 100

## 2015-11-13 MED ORDER — MORPHINE 100MG IN NS 100ML (1MG/ML) PREMIX INFUSION: 2.0000 mg/h | INTRAVENOUS | Status: AC

## 2015-11-13 MED ORDER — GLYCOPYRROLATE 1 MG PO TABS: 1.0000 mg | ORAL_TABLET | Freq: Three times a day (TID) | ORAL | Status: AC | PRN

## 2015-11-13 MED ORDER — LORAZEPAM 0.5 MG PO TABS
0.5000 mg | ORAL_TABLET | ORAL | Status: DC | PRN
Start: 2015-11-13 — End: 2015-11-14

## 2015-11-13 MED ORDER — AMIODARONE HCL IN DEXTROSE 360-4.14 MG/200ML-% IV SOLN
60.0000 mg/h | INTRAVENOUS | Status: DC
  Administered 2015-11-13: 60 mg/h via INTRAVENOUS
  Filled 2015-11-13: qty 200

## 2015-11-13 MED ORDER — GLYCOPYRROLATE 1 MG PO TABS: 1.0000 mg | ORAL_TABLET | Freq: Three times a day (TID) | ORAL | Status: DC | PRN

## 2015-11-13 MED ORDER — PROCHLORPERAZINE 25 MG RE SUPP: 25.0000 mg | Freq: Three times a day (TID) | RECTAL | Status: AC | PRN

## 2015-11-13 MED ORDER — MORPHINE SULFATE (CONCENTRATE) 10 MG/0.5ML PO SOLN
5.0000 mg | ORAL | Status: DC | PRN
  Administered 2015-11-13 – 2015-11-14 (×7): 5 mg via ORAL
  Filled 2015-11-13 (×7): qty 1

## 2015-11-13 MED ORDER — METOPROLOL TARTRATE 25 MG PO TABS: 25.0000 mg | ORAL_TABLET | Freq: Three times a day (TID) | ORAL | Status: AC

## 2015-11-13 MED ORDER — BISACODYL 10 MG RE SUPP: 10.0000 mg | Freq: Every day | RECTAL | Status: DC | PRN

## 2015-11-13 MED ORDER — METOPROLOL TARTRATE 25 MG PO TABS
25.0000 mg | ORAL_TABLET | Freq: Once | ORAL | Status: AC
  Administered 2015-11-13: 25 mg via ORAL
  Filled 2015-11-13: qty 1

## 2015-11-13 MED ORDER — POTASSIUM CHLORIDE 20 MEQ PO PACK: 20.0000 meq | PACK | Freq: Every day | ORAL | Status: DC

## 2015-11-13 MED ORDER — METOPROLOL TARTRATE 25 MG PO TABS
25.0000 mg | ORAL_TABLET | Freq: Three times a day (TID) | ORAL | Status: DC
  Administered 2015-11-13 (×2): 25 mg via ORAL
  Filled 2015-11-13 (×2): qty 1

## 2015-11-13 MED ORDER — LORAZEPAM 2 MG/ML IJ SOLN
0.5000 mg | INTRAMUSCULAR | Status: DC | PRN
  Administered 2015-11-14: 0.5 mg via INTRAVENOUS
  Filled 2015-11-13: qty 1

## 2015-11-13 NOTE — Progress Notes (Addendum)
CSW was consulted by MD Orvan Falconer stating that patient family is going to [facility]. CSW contacted Clydie Braun- [facility]ia[facility] [place] to make the referral. CSW was informed that patient will transfer to 1C for comfort care. CSW informed 1C CSW.   CSW was informed by Clydie Braun that there aren't an available bed at the [facility] today. She reports she'll keep CSW updated. [facility] will [facility]. CSW will continue to follow and assist.   [name], MSW, LCSW-A Clinical Social Work Department [phone]

## 2015-11-13 NOTE — Progress Notes (Signed)
Patient has been sleeping most of this shift, per night shift he became a little restless and had a low grade fever this morning which was relieved by tylenol. Patient had been able to tell me his name, but said he can't remember the year or where he his. He can tell me his daughters name and keeps saying he wants some ice cream. Prior to being NPO this morning, patient was able to take small bites of magic cup with PO meds - followed directions, no coughing after eating. However, NT said that yesterday patient did have some trouble at meal times - patient now changed back to NPO. Patient denies pain, increased respiratory rate, blood pressure stable, still on amio gtt for uncontrolled heart rate. Drowsy, but will awaken - takes awhile for him to open his eyes though. Mainly just answering yes and no questions. Dr. Cherlynn Kaiser and Dr. Orvan Falconer rounding now and communicating with family regarding plan of care. Will continue to monitor.

## 2015-11-13 NOTE — Care Management Important Message (Signed)
Important Message  Patient Details  Name: Matthew Foley MRN: [id number] Date of Birth: 11/26/1932   Medicare Important Message Given:       Marily Memos, RN 11/13/2015, 12:34 PM

## 2015-11-13 NOTE — Progress Notes (Signed)
PARENTERAL NUTRITION CONSULT NOTE - FOLLOW UP   Pharmacy Consult for Electrolyte Monitoring  No Known Allergies  Patient Measurements: Height: 6' (182.9 cm) Weight: 173 lb 9.6 oz (78.744 kg) IBW/kg (Calculated) : 77.6  Vital Signs: Temp: 98.9 F (37.2 C) (05/24 1115) Temp Source: Oral (05/24 1115) BP: 127/52 mmHg (05/24 1115) Pulse Rate: 42 (05/24 1115) Intake/Output from previous day: 05/23 0701 - 05/24 0700 In: 819.8 [P.O.:100; I.V.:69.8; IV Piggyback:650] Out: 425 [Urine:425] Intake/Output from this shift:    Labs:  Recent Labs  11/11/15 0438 11/12/15 0759 11/13/15 0500  WBC 13.9* 14.1* 14.8*  HGB 9.1* 9.1* 8.5*  HCT 27.5* 27.8* 25.5*  PLT 62* 101* 143*     Recent Labs  11/11/15 0438 11/12/15 0759 11/13/15 0500  NA 140 140 142  K 3.4* 3.3* 4.3  CL 112* 110 112*  CO2 23 26 24   GLUCOSE 153* 103* 80  BUN 38* 33* 37*  CREATININE 1.02 0.93 1.15  CALCIUM 7.2* 7.4* 7.5*  MG 1.6* 2.1  --   PHOS 2.6 2.8  --    Estimated Creatinine Clearance: 56.2 mL/min (by C-G formula based on Cr of 1.15).   No results for input(s): GLUCAP in the last 72 hours.  MedicalAssessment: Pharmacy consulted for electrolyte monitoring for 81 yo male experiencing electrolyte disturbances. Patient currently ordered 1/2NS with Potassium 13mEq/L at 30mL/hr and KCl 20 meq packet BID.   Potassium: 4.3  Plan:  Patient takes KCl 20 meq po BID as outpatient.  Will transition to 20 meq po daily and reassess potassium in AM for further dosing changes.   Pharmacy will continue to monitor and adjust per consult.     Amogh Komatsu G 11/13/2015,11:58 AM

## 2015-11-13 NOTE — Progress Notes (Signed)
Pt transferred to room 122 for comfort care. Report called to [facility]. Amio gtt orders d/c'd prior to transport per Dr. Blair Dolphin orders. Family member followed as patient was taken to new room.

## 2015-11-13 NOTE — Progress Notes (Signed)
Nutrition Brief Note  Chart reviewed. Pt now transitioning to comfort care.  No further nutrition interventions warranted at this time. Will sign off. Please re-consult as needed.    Aniaya Bacha MS, RD, LDN [phone] Pager  [phone] Weekend/On-Call Pager    

## 2015-11-13 NOTE — Progress Notes (Signed)
Spoke with Jeraldine Loots NP regarding patient's heartrate 100-130's. On amio drip but PO order from yesterday still showing up - instructed to hold the PO amio and give 25mg  metoprolol tartrate PO once. She will round this afternoon. Will continue to monitor patient.

## 2015-11-13 NOTE — Progress Notes (Signed)
[employer] [facility] [place] [facility]   PATIENT NAME: Matthew Foley    MR#:  [id number]  DATE OF BIRTH:  [birth year]  SUBJECTIVE:   Pt. In resp. Distress this a.m. And lethargic and poorly responsive.  CXR  Suggestive of multi-focal pneumonia/CHF.  Discussed w/ Dr. Orvan Falconer (Palliative Care) and she spoke to pt's daughter and pt. Is comfort care only now.    REVIEW OF SYSTEMS:    Review of Systems  Unable to perform ROS: mental acuity   Nutrition: NPO Tolerating Diet: NO  DRUG ALLERGIES:  No Known Allergies  VITALS:  Blood pressure 120/54, pulse 72, temperature 98.5 F (36.9 C), temperature source Axillary, resp. rate 30, height 6' (1.829 m), weight 78.744 kg (173 lb 9.6 oz), SpO2 92 %.  PHYSICAL EXAMINATION:   Physical Exam  GENERAL:  82 y.o.-year-old patient lying in the bed in Mild to moderate respiratory distress  EYES: Pupils equal, round, reactive to light and accommodation. No scleral icterus. Extraocular muscles intact.  HEENT: Head atraumatic, normocephalic. Oropharynx and nasopharynx clear. Dry Oral Mucosa.  NECK:  Supple, + jugular venous distention. No thyroid enlargement, no tenderness.  LUNGS: Diffuse rhonchi, rales bilaterally. Positive use of accessory muscles. CARDIOVASCULAR: S1, S2 Irregular. No murmurs, rubs, or gallops.  ABDOMEN: Soft, nontender, nondistended. Bowel sounds present. No organomegaly or mass.  EXTREMITIES: No cyanosis, clubbing or +1-2 edema b/l.    NEUROLOGIC: Cranial nerves II through XII are intact. No focal Motor or sensory deficits b/l. Globally weak   PSYCHIATRIC: The patient is alert and oriented x 1.  SKIN: No obvious rash, lesion, Stage III Decubitus ulcer.  Signs of PVD b/l   LABORATORY PANEL:   CBC  Recent Labs Lab 11/13/15 0500  WBC 14.8*  HGB 8.5*  HCT 25.5*  PLT 143*   ------------------------------------------------------------------------------------------------------------------  Chemistries    Recent Labs Lab 11/08/15 0348  11/12/15 0759 11/13/15 0500  NA 140  < > 140 142  K 3.5  < > 3.3* 4.3  CL 117*  < > 110 112*  CO2 18*  < > 26 24  GLUCOSE 156*  < > 103* 80  BUN 30*  < > 33* 37*  CREATININE 1.09  < > 0.93 1.15  CALCIUM 7.4*  < > 7.4* 7.5*  MG 1.5*  < > 2.1  --   AST 70*  --   --   --   ALT 42  --   --   --   ALKPHOS 75  --   --   --   BILITOT 1.0  --   --   --   < > = values in this interval not displayed. ------------------------------------------------------------------------------------------------------------------  Cardiac Enzymes No results for input(s): TROPONINI in the last 168 hours. ------------------------------------------------------------------------------------------------------------------  RADIOLOGY:  Dg Chest 1 View  11/13/2015  CLINICAL DATA:  Shortness of breath. EXAM: CHEST 1 VIEW COMPARISON:  11/12/2015. FINDINGS: Mediastinum hilar structures normal. Stable cardiomegaly. Multifocal bilateral pulmonary infiltrates are again noted without significant improvement. These changes could be related to bilateral multifocal pneumonia and/or pulmonary edema. Small left pleural effusion. No pneumothorax. IMPRESSION: 1. Persistent multifocal bilateral pulmonary infiltrates consistent with multifocal pneumonia. Pulmonary edema cannot be excluded. Stable small left pleural effusion. No interim improvement from prior exam. 2. Cardiomegaly. Electronically Signed   By: Maisie Fus  Register   On: 11/13/2015 12:12   Dg Chest 1 View  11/12/2015  CLINICAL DATA:  PICC placement. EXAM: CHEST 1 VIEW COMPARISON:  11/09/2015 FINDINGS: Left PICC has  been placed and terminates near the cavoatrial junction. Enteric tube has been removed. The cardiac silhouette is upper limits of normal in size. Thoracic aortic atherosclerosis is noted. Patchy bilateral lung opacities are greatest in the left perihilar region and right base and have mildly improved from the prior study. No  sizable pleural effusion or pneumothorax is identified. There is biapical pleural thickening. Right upper quadrant abdominal surgical clips are noted. IMPRESSION: 1. Left PICC terminates over the lower SVC/ cavoatrial junction. 2. Mild interval improvement of bilateral lung opacities suggestive of multifocal pneumonia. Electronically Signed   By: Sebastian Ache M.D.   On: 11/12/2015 14:22     ASSESSMENT AND PLAN:   82 year old male with past medical history of hypertension, hyperlipidemia, coronary artery disease, rheumatoid arthritis, chronic afibrillation, GERD, depression who presented to the hospital due to altered mental status and noted to be in acute renal failure with hyperkalemia and atrial fibrillation with RVR.  1. Acute respiratory failure-patient noted to be in significant respiratory distress this morning. Chest x-ray obtained status of pulmonary edema/multifocal pneumonia. ABG showing significant hypoxemia. -Given patient's multiple comorbidities and poor prognosis palliative care discussed close care with the patient's daughter and patient is now comfort care only - cont. Ativan, Morphine for comfort.   2. Sepsis-secondary to MRSA UTI and bacteremia. - was on IV Vancomycin and a PICC line was to be placed but pt. Is comfort care only now.  . -Repeat blood cultures from 11/09/2015 on negative so far.   3. Urinary tract infection-secondary to MRSA.  -s/p Foley due to urinary retention and also now for comfort.   4 Altered mental status-metabolic encephalopathy secondary to the sepsis/UTI and now Acute resp. Failure w/ hypoxia.   4. Acute kidney injury-secondary to sepsis and ATN with underlying UTI with urinary retention. Cr. Back to baseline. - cont. foley due to urinary retention.   5. Hypokalemia/Hypomagnesemia- improved w/ supplementation and will monitor.   6. Dysphagia-secondary to altered mental status and encephalopathy. - seen by speech and started on dysphagia 1 diet  but still aspiration and noted to be in resp. Failure this a.m.  - keep NPO for now and only pleasure feeds.   7. Atrial fibrillation with rapid ventricular response-had to be placed back on amio gtt yesterday due to RVR and now taken off again as pt. Is comfort care only.  8. Sacral decubitus - Stage III. Cont. Local wound care as per wound team.   Appreciate palliative care input and pt. Is now COMFORT CARE ONLY and transferred to 1c.  Likely [facility] to [facility] when bed available.   All the records are reviewed and case discussed with Care Management/Social Workerr. Management plans discussed with the patient, family and they are in agreement.  CODE STATUS: Partial  DVT Prophylaxis: Teds and SCDs  TOTAL TIME TAKING CARE OF THIS PATIENT: 30 minutes.   POSSIBLE D/C IN 2-3 DAYS, DEPENDING ON CLINICAL CONDITION.   Houston Siren M.D on 11/13/2015 at 1:47 PM  Between 7am to 6pm - Pager - [phone]  After 6pm go to [email or web address] - password [email or web address] [facility]  [facility]  Office  [phone]  CC: Primary care physician; Jaclyn Shaggy, MD

## 2015-11-13 NOTE — Progress Notes (Signed)
Patient's HR continues to sustain in the 130s post Amio bolus. Notified Dr. Sheryle Hail and orders placed for patient to be started back on Amio drip. Nursing staff will continue to monitor. Lamonte Richer, RN

## 2015-11-13 NOTE — Progress Notes (Signed)
Palliative Medicine Inpatient Consult Follow Up Note   Name: Matthew Foley Date: 11/13/2015 MRN: [id number]  DOB: 1932/12/04  Referring Physician: Houston Siren, MD  Palliative Care consult requested for this 82 y.o. male for goals of medical therapy in patient with a turn for the worse since yesterday.  He now has a new aspiration pneumonia and his HR went up to 140 with AF RVR .    DISCUSSIONS AND PLAN: Pt was set to go to rehab facility today with a Palliative Care Consult. BUT, I had told his daughter, Matthew Foley, by phone last pm, that he has a number of severe underlying conditions that make him fit a statistical category that means he will probably not be alive a few months from now.  I had related all of his chronic severe conditions to her including his serious sacral decubitus, his very severe malnutrition, his other wounds (all wounds related to his recent Rhabdo), his likelihood to get recurrent sepsis bouts, his heart disease, his dysphagia making aspiration pneumonia likely to happen often, his renal failure, his aortic stenosis and other problems.  I had noted that he had a resp rate of 24 last night before I left the hospital. This wasn't terrible, but I was concerned he might be getting worse in terms of respiratory status.  This am, he is in acute on chronic resp failure. His CXR was viewed by me and it shows infiltrates in all lung zones.  I suspect he aspirated much of what he ate yesterday when he was looking so good and eating.  His new primary problem is ASPIRATION PNEUMONIA. He also got back on the Amiodarone drip due to more AF RVR.  I called his main contact, daughter, Matthew Foley. She was informed that Dr. Cherlynn Foley was about to call her with an update when i walked on the unit anf volunteered to call. I updated her about her dad looking much, much worse, with a fast HR, fast breathing, unresponsiveness, etc. An ABG this am shows acute hypoxia with a pO2 of 57.  His other  vital signs are acceptable.  HR has come down since it was 140.   Daughter agrees with terminal comfort care now. She is informed about the usefulness of low dose morphine. She was given CHOICES about settings for comfort care and she inquired about a Hospice facility such as [facility]. She is familiar with [facility] here in [place] and she asked about this facility in particular. She was told about other Hospice facilities but she said she wanted the one in [place] since that is closer to the family.    I have notified Social Worker about the request and await more info about Hospice Home as an option for pt.  He is currently stable for transfer, but family knows this could change.    I have ordered terminal comfort care orders.  Will try to get some rate controlling med into him (for comfort) IF feasible.  IV Amio to run via PICC line until he is transferred to 1C.    I will do the DC med recs. I have updated attending and nursing.    Prolonged visit today over 120 minutes.    CLINICAL NARRATIVE: Pt is an 82 yo man with chronic illnesses who was admitted due to nausea, vomiting, more confusion, and increased tremors to arms. He was found to be in acute renal failure in the ED and his potassium was high. He had a wide complex tachycardia  with a rate of 170. He was shocked and cardioverted in the ED by ED physician and later was started on an Amiodarone drip. He was given IV calcium chloride and oral Kayexalate in the ED for the hyperkalemia. Since then, he has been made a 'limited code' with antiarrhythmics allowed --perhaps b/c they worked in this instance to help him.  He had been hospitalized here very recently with acute metabolic encephalopathy from renal failure and rhabdomyolsysi. He has AFib and is not on anticoagulation, CAD, a history of major depression, HTN, and DJD. He was previous to this, living alone in his home. He had been found lying on the floor for about 2 days  where he had lain after a fall. He had had some hematemesis and has a known h/o GERD. This resolved on its own. He was noted then to be severely malnourished. His BPH was treated with Flomax. He was discharged to [facility] for a rehab stay, but he was only there for four days when he was re-admitted here.   Nephrology felt the acute renal failure was likely due to urinary retention and Flomax is to continue. A foley resulted tin 2300 cc obtained immediately. He later retained as much as 1 liter so a foley was placed. Cardiology was consulted but felt that his AFib was related to huis abnormal potassium labs . Amiodarone continued for a time. He had some elevated troponins and had been on a heparin drip for chest pain/ acs, but this was stopped. He is normally followed for his cardiac disease by Dr. Lady Foley.   His phosphorus was critically low and he has had other metabolic derangements addressed. He had to have NG feedings due to very poor intake, but this was stopped and he has been given a diet by S[facility], although it is very texture-restricted to prevent aspiration,which he is subject to. He is now eating as much as 40% ONLY WHEN FED EVERY BITE AND SIP.   He had a fever of 102 on 5/17 and had blood cxs. He was found to have MRSA baceteremia and Clostridial Bactermia with clinical sepsis. He was treated with fluids and pressors and with ABX and has improved. Pressors were DCd. He is currently getting Vanco until June 5th after which time the PICC line can be DCd. No need for ID follow up. Augmentin is also to stop on June 5th.   He has an unstageable sacral decubitus ulcer which will be unlikely to heal given his comorbid conditions. He also has pressure injury to right hip and right shoulder. These were present at adm. Sacral ulcer is 7 cm by 5 cm with 100% slough with foul odor. It covers his sacrum and upper buttocks. Right hip decub is 4 cm by 2 cm with adherent slough. Right shoulder is 1 cm by  1 cm. These are likely related to his prior fall and time lying on the floor for two days prior to his most recent admission (and due to rhabdo).   Pt had a rec. From Urology to 'continue foley for now' --but apparently no order had been placed, so the foley got removed on 5/20. Pt did not pick up and start putting out urine on his own and they had to do in and out caths to get any urine out. Eventually, the foley was placed again (with an order). Actually, given pts severe urinary retention and his very large sacral decubitus, he has TWO reasons to keep a foley in place. He can  follow up as an outpt about the obstruction, but in fact, the decubitus will necessitate a foley until this is better or resolved (and it is unlikely to heal completeley if at all given his severe malnutrition found present at this admission.)  Pt got much worse in terms of resp function on 11/13/15.   ----------------------------------------------------    REVIEW OF SYSTEMS:  Pt was unresponsive earlier this am, but now he said, "I'm hurting".   CODE STATUS: DNR--changed to full DNR now.   PAST MEDICAL HISTORY: Past Medical History  Diagnosis Date  . Depression     h/o psychiatric hospitalisations prior  . Hypertension   . Hyperlipidemia   . CAD (coronary artery disease)     at least 1 stent  . Hx of pancreatitis   . Rheumatoid arthritis (HCC)   . Hypothyroidism   . GERD (gastroesophageal reflux disease)   . Atrial fibrillation (HCC)     PAST SURGICAL HISTORY:  Past Surgical History  Procedure Laterality Date  . Knee replacement Bilateral   . Cholecystectomy    . Hernia repair    . Toe amputation      Right, 2nd toe  . Joint replacement      Vital Signs: BP 127/52 mmHg  Pulse 42  Temp(Src) 98.9 F (37.2 C) (Oral)  Resp 22  Ht 6' (1.829 m)  Wt 78.744 kg (173 lb 9.6 oz)  BMI 23.54 kg/m2  SpO2 91% Filed Weights   11/11/15 0419 11/12/15 0420 11/13/15 0515  Weight: 78.12 kg (172 lb 3.6 oz)  79.379 kg (175 lb) 78.744 kg (173 lb 9.6 oz)    Estimated body mass index is 23.54 kg/(m^2) as calculated from the following:   Height as of this encounter: 6' (1.829 m).   Weight as of this encounter: 78.744 kg (173 lb 9.6 oz).  PHYSICAL EXAM: Lying in medical bed with HOB up, tele wires in place, Pt unresponsive. --but he will halfway open eyes. ---later he woke enough to say he was hurting and wanted ice chips No JVD seen hrt irreg irreg rate about 110 Lungs with rales ant Abd soft and NT Ext no mottling or cyanosis as yet.  LABS: CBC:    Component Value Date/Time   WBC 14.8* 11/13/2015 0500   WBC 9.4 02/06/2014 1206   HGB 8.5* 11/13/2015 0500   HGB 11.8* 02/06/2014 1206   HCT 25.5* 11/13/2015 0500   HCT 35.0* 02/06/2014 1206   PLT 143* 11/13/2015 0500   PLT 283 02/06/2014 1206   MCV 93.4 11/13/2015 0500   MCV 94 02/06/2014 1206   NEUTROABS 15.4* 10/28/2015 1022   NEUTROABS 7.8* 02/06/2014 1206   LYMPHSABS 0.9* 10/28/2015 1022   LYMPHSABS 0.7* 02/06/2014 1206   MONOABS 1.2* 10/28/2015 1022   MONOABS 0.7 02/06/2014 1206   EOSABS 0.0 10/28/2015 1022   EOSABS 0.2 02/06/2014 1206   BASOSABS 0.0 10/28/2015 1022   BASOSABS 0.1 02/06/2014 1206   Comprehensive Metabolic Panel:    Component Value Date/Time   NA 142 11/13/2015 0500   NA 137 02/06/2014 1206   K 4.3 11/13/2015 0500   K 4.1 02/06/2014 1206   CL 112* 11/13/2015 0500   CL 102 02/06/2014 1206   CO2 24 11/13/2015 0500   CO2 26 02/06/2014 1206   BUN 37* 11/13/2015 0500   BUN 11 02/06/2014 1206   CREATININE 1.15 11/13/2015 0500   CREATININE 1.24 02/06/2014 1206   GLUCOSE 80 11/13/2015 0500   GLUCOSE 90 02/06/2014 1206  CALCIUM 7.5* 11/13/2015 0500   CALCIUM 8.0* 02/06/2014 1206   AST 70* 11/08/2015 0348   AST 28 01/21/2014 1320   ALT 42 11/08/2015 0348   ALT 16 01/21/2014 1320   ALKPHOS 75 11/08/2015 0348   ALKPHOS 78 01/21/2014 1320   BILITOT 1.0 11/08/2015 0348   BILITOT 0.8 01/21/2014 1320    PROT 4.7* 11/08/2015 0348   PROT 7.5 01/21/2014 1320   ALBUMIN 1.4* 11/08/2015 0348   ALBUMIN 3.1* 01/21/2014 1320    More than 50% of the visit was spent in counseling/coordination of care: YES  Time Spent:  120 min

## 2015-11-13 NOTE — Progress Notes (Signed)
Notified MD of patient's HR sustaining in the 120s-130s. Order for 150mg  bolus of Amiodarone received and patient may have to be started back on drip if bolus is not successful per Dr. . Nursing staff will continue to monitor. Sheryle Hail, RN

## 2015-11-13 NOTE — Progress Notes (Signed)
New hospice home referral received from [facility] following a Palliative Care consult. Mr. Sinkfield is n 82 year old man admitted to [facility] on 5/15 from [facility] for evaluation of abnormal labs, admitting potassium was 6.0. He has continued to have numerous set backs including septic shock from his sacral wound requiring blood pressure support and IV antibiotics, Afib with RVR and most recently he had an episiode of acute respiratory failure with chest xray this morning showing pneumonia and or pulmonary hypertension. Dr. Megan Salon has spoken to patient's daughter Langley Gauss and  she has chosen to focus on her father's comfort with transfer to the hospice home. Dr. Megan Salon initiated a continuous morphine drip at 2 mg/hr for management of pain. Of note he has several wounds 3, which are unstageable and a history of rheumatoid arthritis. Writer met in the patient's room with his daughter's HCPOA Langley Gauss and Santiago Glad to initiate education regarding hospice services, philosophy and team approach to care with good understanding voiced. Questions answered, consents signed. Information faxed to referral. Patient was alert at times, speech very difficult to understand. He did continue to report pain when asked and also repeatedly asked for ice, then ice cream, which was given. He was able to manipulate ice chips, but did cough after a few minutes. There is currently no bed available for transfer, family and Hospital care team all aware. Will continue to follow through final disposition. Thank you for the opportunity to be involved in the care of this patient and his family. Flo Shanks RN, BSN, [facility] [place], [facility] [phone] c

## 2015-11-14 DIAGNOSIS — M069 Rheumatoid arthritis, unspecified: Secondary | ICD-10-CM

## 2015-11-14 DIAGNOSIS — J690 Pneumonitis due to inhalation of food and vomit: Secondary | ICD-10-CM | POA: Insufficient documentation

## 2015-11-14 LAB — CULTURE, BLOOD (ROUTINE X 2)
Culture: NO GROWTH
Culture: NO GROWTH

## 2015-11-14 MED ORDER — SCOPOLAMINE 1 MG/3DAYS TD PT72
1.0000 | MEDICATED_PATCH | TRANSDERMAL | Status: DC
  Administered 2015-11-14: 03:00:00 1.5 mg via TRANSDERMAL
  Filled 2015-11-14: qty 1

## 2015-11-21 NOTE — Progress Notes (Signed)
   11/11/2015 1215  Clinical Encounter Type  Visited With Patient and family together  Visit Type Death  Referral From Nurse  Consult/Referral To Chaplain  Spiritual Encounters  Spiritual Needs Emotional;Prayer;Grief support  Stress Factors  Patient Stress Factors Loss  Family Stress Factors Loss  Provided grief care to family after death of "Sonny" Fayrene Fearing). Conducted life review and provided prayer. Chap. Jamonte Curfman G. Chais Fehringer, ext. [phone]

## 2015-11-21 NOTE — Progress Notes (Signed)
Palliative Medicine Inpatient Consult Follow Up Note   Name: Matthew Foley Date: 11-17-15 MRN: [id number]  DOB: 1932/10/22  Referring Physician: Katha Hamming, MD  Palliative Care consult requested for this 82 y.o. male for goals of medical therapy in patient with aspiration pneumonia and many other problems.  DISCUSSIONS AND PLAN: 1.  I had placed an order for titration of morphine yesterday, but apparently, my order did not 'cross over' so he ended up getting additional Roxanol hourly last pm.  This is clarified this am so that the titration order is in place. He is currently on 4 mg/ hr morphine infusion (can titrate 2-15 mg for pain or air hunger). Tylenol supp prn fever.     2.  He is febrile and his BP is very low 53/ ?.  He is not stable for transfer to [facility]. I called and alerted Hospice Liaison, [place].  He has SCDs and foot pumps on and these were DCd in orders yesterday and are removed today.  He will pass away here.   3. I have called and updated daughter. She was aware as her son stayed w/ pt all night and left at 6 am and he updated her.    CLINICAL NARRATIVE: Pt is an 82 yo man with chronic illnesses who was admitted due to nausea, vomiting, more confusion, and increased tremors to arms. He was found to be in acute renal failure in the ED and his potassium was high. He had a wide complex tachycardia with a rate of 170. He was shocked and cardioverted in the ED by ED physician and later was started on an Amiodarone drip. He was given IV calcium chloride and oral Kayexalate in the ED for the hyperkalemia. Since then, he has been made a 'limited code' with antiarrhythmics allowed --perhaps b/c they worked in this instance to help him.  He had been hospitalized here very recently with acute metabolic encephalopathy from renal failure and rhabdomyolsysi. He has AFib and is not on anticoagulation, CAD, a history of major depression, HTN, and DJD. He was previous  to this, living alone in his home. He had been found lying on the floor for about 2 days where he had lain after a fall. He had had some hematemesis and has a known h/o GERD. This resolved on its own. He was noted then to be severely malnourished. His BPH was treated with Flomax. He was discharged to [facility] for a rehab stay, but he was only there for four days when he was re-admitted here.   Nephrology felt the acute renal failure was likely due to urinary retention and Flomax is to continue. A foley resulted tin 2300 cc obtained immediately. He later retained as much as 1 liter so a foley was placed. Cardiology was consulted but felt that his AFib was related to huis abnormal potassium labs . Amiodarone continued for a time. He had some elevated troponins and had been on a heparin drip for chest pain/ acs, but this was stopped. He is normally followed for his cardiac disease by Dr. Lady Gary.   His phosphorus was critically low and he has had other metabolic derangements addressed. He had to have NG feedings due to very poor intake, but this was stopped and he has been given a diet by S[facility], although it is very texture-restricted to prevent aspiration,which he is subject to. He is now eating as much as 40% ONLY WHEN FED EVERY BITE AND SIP.   He had a fever  of 102 on 5/17 and had blood cxs. He was found to have MRSA baceteremia and Clostridial Bactermia with clinical sepsis. He was treated with fluids and pressors and with ABX and has improved. Pressors were DCd. He is currently getting Vanco until June 5th after which time the PICC line can be DCd. No need for ID follow up. Augmentin is also to stop on June 5th.   He has an unstageable sacral decubitus ulcer which will be unlikely to heal given his comorbid conditions. He also has pressure injury to right hip and right shoulder. These were present at adm. Sacral ulcer is 7 cm by 5 cm with 100% slough with foul odor. It covers his sacrum and upper  buttocks. Right hip decub is 4 cm by 2 cm with adherent slough. Right shoulder is 1 cm by 1 cm. These are likely related to his prior fall and time lying on the floor for two days prior to his most recent admission (and due to rhabdo).   Pt had a rec. From Urology to 'continue foley for now' --but apparently no order had been placed, so the foley got removed on 5/20. Pt did not pick up and start putting out urine on his own and they had to do in and out caths to get any urine out. Eventually, the foley was placed again (with an order). Actually, given pts severe urinary retention and his very large sacral decubitus, he has TWO reasons to keep a foley in place. He can follow up as an outpt about the obstruction, but in fact, the decubitus will necessitate a foley until this is better or resolved (and it is unlikely to heal completeley if at all given his severe malnutrition found present at this admission.)   ACTIVE PROBLEMS: Septic shock  --due to infected decubiti Weakness Metabolic Encephalopathy Sacral Decubitus HTN HLD CAD with at least 1 stent Afib hypthyroidism Rheumatoid Arthritis GERD Dysphagia H/O depression (with prior psych hosp stays) H/O pancreatitis Wide Complex tachycardia --resolved Recent Acute Renal Failure --Cr now down to 0.93.  Anemia of chronic disease Severe Malnutirition ---with albumin low at 1.4 Thrombocytopenia --worse recently Leukocytosis Aortic Stenosis ---listed as severe on echo report Severe Cardiomyopathy with LVEF at 25% --echo 5/16 (previous echo on 5/9 showed EF of 45% so this was acute change) Pulmonary HTN] Paroxysmal SVT and VT --digoxin added to regimen when HR is greater than 100  REVIEW OF SYSTEMS:  Patient is not able to provide ROS due to being obtunded and actively dying.  CODE STATUS: DNR   PAST MEDICAL HISTORY: Past Medical History  Diagnosis Date  . Depression     h/o psychiatric hospitalisations prior  .  Hypertension   . Hyperlipidemia   . CAD (coronary artery disease)     at least 1 stent  . Hx of pancreatitis   . Rheumatoid arthritis (HCC)   . Hypothyroidism   . GERD (gastroesophageal reflux disease)   . Atrial fibrillation (HCC)     PAST SURGICAL HISTORY:  Past Surgical History  Procedure Laterality Date  . Knee replacement Bilateral   . Cholecystectomy    . Hernia repair    . Toe amputation      Right, 2nd toe  . Joint replacement      Vital Signs: BP 53/26 mmHg  Pulse 58  Temp(Src) 102.6 F (39.2 C) (Oral)  Resp 32  Ht 6' (1.829 m)  Wt 78.744 kg (173 lb 9.6 oz)  BMI 23.54 kg/m2  SpO2  87% Filed Weights   11/11/15 0419 11/12/15 0420 11/13/15 0515  Weight: 78.12 kg (172 lb 3.6 oz) 79.379 kg (175 lb) 78.744 kg (173 lb 9.6 oz)    Estimated body mass index is 23.54 kg/(m^2) as calculated from the following:   Height as of this encounter: 6' (1.829 m).   Weight as of this encounter: 78.744 kg (173 lb 9.6 oz).  PHYSICAL EXAM: Lying in medical bed unresponsive, mouth breathing evenly but shallowly Eyes closed Slight grimace noted No JVD or TM Hrt rrr no m tachy 110 Abd soft nt Ext he has full length SCDs on and foot pumps --but these were DCd yesterday   LABS: CBC:    Component Value Date/Time   WBC 14.8* 11/13/2015 0500   WBC 9.4 02/06/2014 1206   HGB 8.5* 11/13/2015 0500   HGB 11.8* 02/06/2014 1206   HCT 25.5* 11/13/2015 0500   HCT 35.0* 02/06/2014 1206   PLT 143* 11/13/2015 0500   PLT 283 02/06/2014 1206   MCV 93.4 11/13/2015 0500   MCV 94 02/06/2014 1206   NEUTROABS 15.4* 10/28/2015 1022   NEUTROABS 7.8* 02/06/2014 1206   LYMPHSABS 0.9* 10/28/2015 1022   LYMPHSABS 0.7* 02/06/2014 1206   MONOABS 1.2* 10/28/2015 1022   MONOABS 0.7 02/06/2014 1206   EOSABS 0.0 10/28/2015 1022   EOSABS 0.2 02/06/2014 1206   BASOSABS 0.0 10/28/2015 1022   BASOSABS 0.1 02/06/2014 1206   Comprehensive Metabolic Panel:    Component Value Date/Time   NA 142  11/13/2015 0500   NA 137 02/06/2014 1206   K 4.3 11/13/2015 0500   K 4.1 02/06/2014 1206   CL 112* 11/13/2015 0500   CL 102 02/06/2014 1206   CO2 24 11/13/2015 0500   CO2 26 02/06/2014 1206   BUN 37* 11/13/2015 0500   BUN 11 02/06/2014 1206   CREATININE 1.15 11/13/2015 0500   CREATININE 1.24 02/06/2014 1206   GLUCOSE 80 11/13/2015 0500   GLUCOSE 90 02/06/2014 1206   CALCIUM 7.5* 11/13/2015 0500   CALCIUM 8.0* 02/06/2014 1206   AST 70* 11/08/2015 0348   AST 28 01/21/2014 1320   ALT 42 11/08/2015 0348   ALT 16 01/21/2014 1320   ALKPHOS 75 11/08/2015 0348   ALKPHOS 78 01/21/2014 1320   BILITOT 1.0 11/08/2015 0348   BILITOT 0.8 01/21/2014 1320   PROT 4.7* 11/08/2015 0348   PROT 7.5 01/21/2014 1320   ALBUMIN 1.4* 11/08/2015 0348   ALBUMIN 3.1* 01/21/2014 1320    More than 50% of the visit was spent in counseling/coordination of care: YES  Time Spent:  35 min

## 2015-11-21 NOTE — Progress Notes (Signed)
Spoke with Dr. Orvan Falconer, pt is showing signs of more air hunger requiring PRN dose of roxanol every 1 hour through the night. Verbal order given to titrate morphine drip.  Pt's BP is low at this time and he is unresponsive.  Not stable for transport to the hospice home today. Will monitor pt to keep him comfortable.

## 2015-11-21 NOTE — Discharge Summary (Signed)
    Death Note please see Last Note for all details.   In breif -82 year old male patient with history of essential hypertension, coronary artery disease hyperlipidemia, rheumatoid arthritis came because of altered mental status found to have acute renal failure with hyperkalemia, atrial fibrillation with RVR. Patient also developed acute respiratory failure with pulmonary edema, multifocal pneumonia and hypoxia. Patient received IV antibiotics, oxygen, The patient had acute renal failure secondary to urinary retention with MRSA UTI, sacral decubiti.  Pt  had a wide-complex tachycardia with heart rate 1 70 bpm, he was shocked then cardioverted in the emergency room. Admitted to ICU. For hyperkalemia potassium 6.3 he received calcium gluconate and the potassium shifting measures. Incision seen by nephrology also. Patient received IV hydration. And Foley catheter was placed. because of multiple medical problems, made comfort care. Patient is seen by palliative care Dr. Orvan Falconer. Also started on morphine drip. It expired on 05-26-2024at 11:50 AM.    Matthew Foley CSN:[id number],MRN:[id number] is a 82 y.o. male, Outpatient Primary MD for the patient is Matthew Shaggy, MD  Pronounced dead by RN, on  Nov 15, 2022 at 11:50 AM                      Cause of death  #1 septic shock Sacral decubiti Urinary retention White complex tachycardia Acute renal failure MRSA bacteremia  Total clinical and documentation time for today Under 30 minutes   Last Note

## 2015-11-21 NOTE — Progress Notes (Signed)
Death Pronouncement  Pt had been seen to be comfortable by nurses on their rounds at about 11:40am. His daughter came in to talk with him though he was unresponsive. She noted at some point that he wasn't breathing and called nursing.  I was on the unit and came and examined pt.  Pt is found to be lifeless and pale in appearance, with fixed pupils, no pulse or heart sounds and no respirations or heart sounds.  He is pronouned dead at 11:50 am.    Immediate cause of death is felt to be Aspiration Pneumonia with multiple underlying and comorbid conditions.  Suan Halter, MD

## 2015-11-21 NOTE — Progress Notes (Signed)
Follow up on new referral for hospice home for EOL care.  Notified by Dr. Orvan Falconer that the Mr. Mckone is too unstable for transport and will remain at the hospital for EOL care.  Please notify our agency if any changes.  Thank you.  Norris Cross, MA, BSN, RN, FNE [phone]

## 2015-11-21 DEATH — deceased
